# Patient Record
Sex: Female | Born: 1950 | Race: White | Hispanic: No | Smoking: Never smoker
Health system: Southern US, Community
[De-identification: ages and names within clinical notes are randomized; demographics above are authoritative.]

## PROBLEM LIST (undated history)

## (undated) DIAGNOSIS — R17 Unspecified jaundice: Secondary | ICD-10-CM

## (undated) DIAGNOSIS — I2510 Atherosclerotic heart disease of native coronary artery without angina pectoris: Secondary | ICD-10-CM

## (undated) DIAGNOSIS — I447 Left bundle-branch block, unspecified: Secondary | ICD-10-CM

## (undated) DIAGNOSIS — M1990 Unspecified osteoarthritis, unspecified site: Secondary | ICD-10-CM

## (undated) DIAGNOSIS — I809 Phlebitis and thrombophlebitis of unspecified site: Secondary | ICD-10-CM

## (undated) DIAGNOSIS — K219 Gastro-esophageal reflux disease without esophagitis: Secondary | ICD-10-CM

## (undated) DIAGNOSIS — R112 Nausea with vomiting, unspecified: Secondary | ICD-10-CM

## (undated) DIAGNOSIS — R0600 Dyspnea, unspecified: Secondary | ICD-10-CM

## (undated) DIAGNOSIS — R011 Cardiac murmur, unspecified: Secondary | ICD-10-CM

## (undated) DIAGNOSIS — M810 Age-related osteoporosis without current pathological fracture: Secondary | ICD-10-CM

## (undated) DIAGNOSIS — I82409 Acute embolism and thrombosis of unspecified deep veins of unspecified lower extremity: Secondary | ICD-10-CM

## (undated) DIAGNOSIS — I509 Heart failure, unspecified: Secondary | ICD-10-CM

## (undated) DIAGNOSIS — I10 Essential (primary) hypertension: Secondary | ICD-10-CM

## (undated) DIAGNOSIS — R87629 Unspecified abnormal cytological findings in specimens from vagina: Secondary | ICD-10-CM

## (undated) DIAGNOSIS — K759 Inflammatory liver disease, unspecified: Secondary | ICD-10-CM

## (undated) DIAGNOSIS — K829 Disease of gallbladder, unspecified: Secondary | ICD-10-CM

## (undated) DIAGNOSIS — Z9889 Other specified postprocedural states: Secondary | ICD-10-CM

## (undated) HISTORY — PX: ABLATION: SHX5711

## (undated) HISTORY — DX: Disease of gallbladder, unspecified: K82.9

## (undated) HISTORY — DX: Essential (primary) hypertension: I10

## (undated) HISTORY — PX: CHOLECYSTECTOMY: SHX55

## (undated) HISTORY — DX: Age-related osteoporosis without current pathological fracture: M81.0

## (undated) HISTORY — DX: Unspecified jaundice: R17

## (undated) HISTORY — DX: Phlebitis and thrombophlebitis of unspecified site: I80.9

## (undated) HISTORY — PX: REPLACEMENT TOTAL HIP W/  RESURFACING IMPLANTS: SUR1222

## (undated) HISTORY — PX: CATARACT EXTRACTION W/ INTRAOCULAR LENS IMPLANT: SHX1309

## (undated) HISTORY — DX: Unspecified osteoarthritis, unspecified site: M19.90

## (undated) HISTORY — DX: Unspecified abnormal cytological findings in specimens from vagina: R87.629

---

## 1982-12-21 HISTORY — PX: TUBAL LIGATION: SHX77

## 1998-04-05 ENCOUNTER — Ambulatory Visit (HOSPITAL_COMMUNITY): Admission: RE | Admit: 1998-04-05 | Discharge: 1998-04-05 | Payer: Self-pay | Admitting: Family Medicine

## 1999-10-14 ENCOUNTER — Other Ambulatory Visit: Admission: RE | Admit: 1999-10-14 | Discharge: 1999-10-14 | Payer: Self-pay | Admitting: Obstetrics and Gynecology

## 1999-11-20 ENCOUNTER — Encounter: Payer: Self-pay | Admitting: Family Medicine

## 1999-11-20 ENCOUNTER — Encounter: Admission: RE | Admit: 1999-11-20 | Discharge: 1999-11-20 | Payer: Self-pay | Admitting: Family Medicine

## 2000-04-05 ENCOUNTER — Other Ambulatory Visit: Admission: RE | Admit: 2000-04-05 | Discharge: 2000-04-05 | Payer: Self-pay | Admitting: Obstetrics and Gynecology

## 2000-10-28 ENCOUNTER — Other Ambulatory Visit: Admission: RE | Admit: 2000-10-28 | Discharge: 2000-10-28 | Payer: Self-pay | Admitting: Obstetrics and Gynecology

## 2001-03-12 ENCOUNTER — Emergency Department (HOSPITAL_COMMUNITY): Admission: EM | Admit: 2001-03-12 | Discharge: 2001-03-12 | Payer: Self-pay | Admitting: Emergency Medicine

## 2001-11-03 ENCOUNTER — Other Ambulatory Visit: Admission: RE | Admit: 2001-11-03 | Discharge: 2001-11-03 | Payer: Self-pay | Admitting: Obstetrics and Gynecology

## 2001-12-29 ENCOUNTER — Encounter: Admission: RE | Admit: 2001-12-29 | Discharge: 2001-12-29 | Payer: Self-pay | Admitting: Family Medicine

## 2001-12-29 ENCOUNTER — Encounter: Payer: Self-pay | Admitting: Family Medicine

## 2002-07-13 ENCOUNTER — Encounter: Admission: RE | Admit: 2002-07-13 | Discharge: 2002-07-13 | Payer: Self-pay | Admitting: Family Medicine

## 2002-07-13 ENCOUNTER — Encounter: Payer: Self-pay | Admitting: Family Medicine

## 2003-03-14 ENCOUNTER — Other Ambulatory Visit: Admission: RE | Admit: 2003-03-14 | Discharge: 2003-03-14 | Payer: Self-pay | Admitting: Obstetrics and Gynecology

## 2003-08-13 ENCOUNTER — Ambulatory Visit (HOSPITAL_COMMUNITY): Admission: RE | Admit: 2003-08-13 | Discharge: 2003-08-13 | Payer: Self-pay | Admitting: Gastroenterology

## 2004-03-24 ENCOUNTER — Other Ambulatory Visit: Admission: RE | Admit: 2004-03-24 | Discharge: 2004-03-24 | Payer: Self-pay | Admitting: Obstetrics and Gynecology

## 2007-12-04 ENCOUNTER — Emergency Department (HOSPITAL_COMMUNITY): Admission: EM | Admit: 2007-12-04 | Discharge: 2007-12-04 | Payer: Self-pay | Admitting: Emergency Medicine

## 2007-12-22 HISTORY — PX: TOTAL HIP ARTHROPLASTY: SHX124

## 2008-05-04 ENCOUNTER — Encounter: Admission: RE | Admit: 2008-05-04 | Discharge: 2008-05-04 | Payer: Self-pay | Admitting: Orthopedic Surgery

## 2008-09-10 ENCOUNTER — Inpatient Hospital Stay (HOSPITAL_COMMUNITY): Admission: RE | Admit: 2008-09-10 | Discharge: 2008-09-14 | Payer: Self-pay | Admitting: Orthopedic Surgery

## 2009-12-16 IMAGING — CR DG CHEST 2V
2 series · 2 of 2 positions shown · non-contrast
Comparison: None

CLINICAL DATA: Preop for left hip arthroplasty.  Nonsmoker.

CHEST - 2 VIEW

[w chest pa]
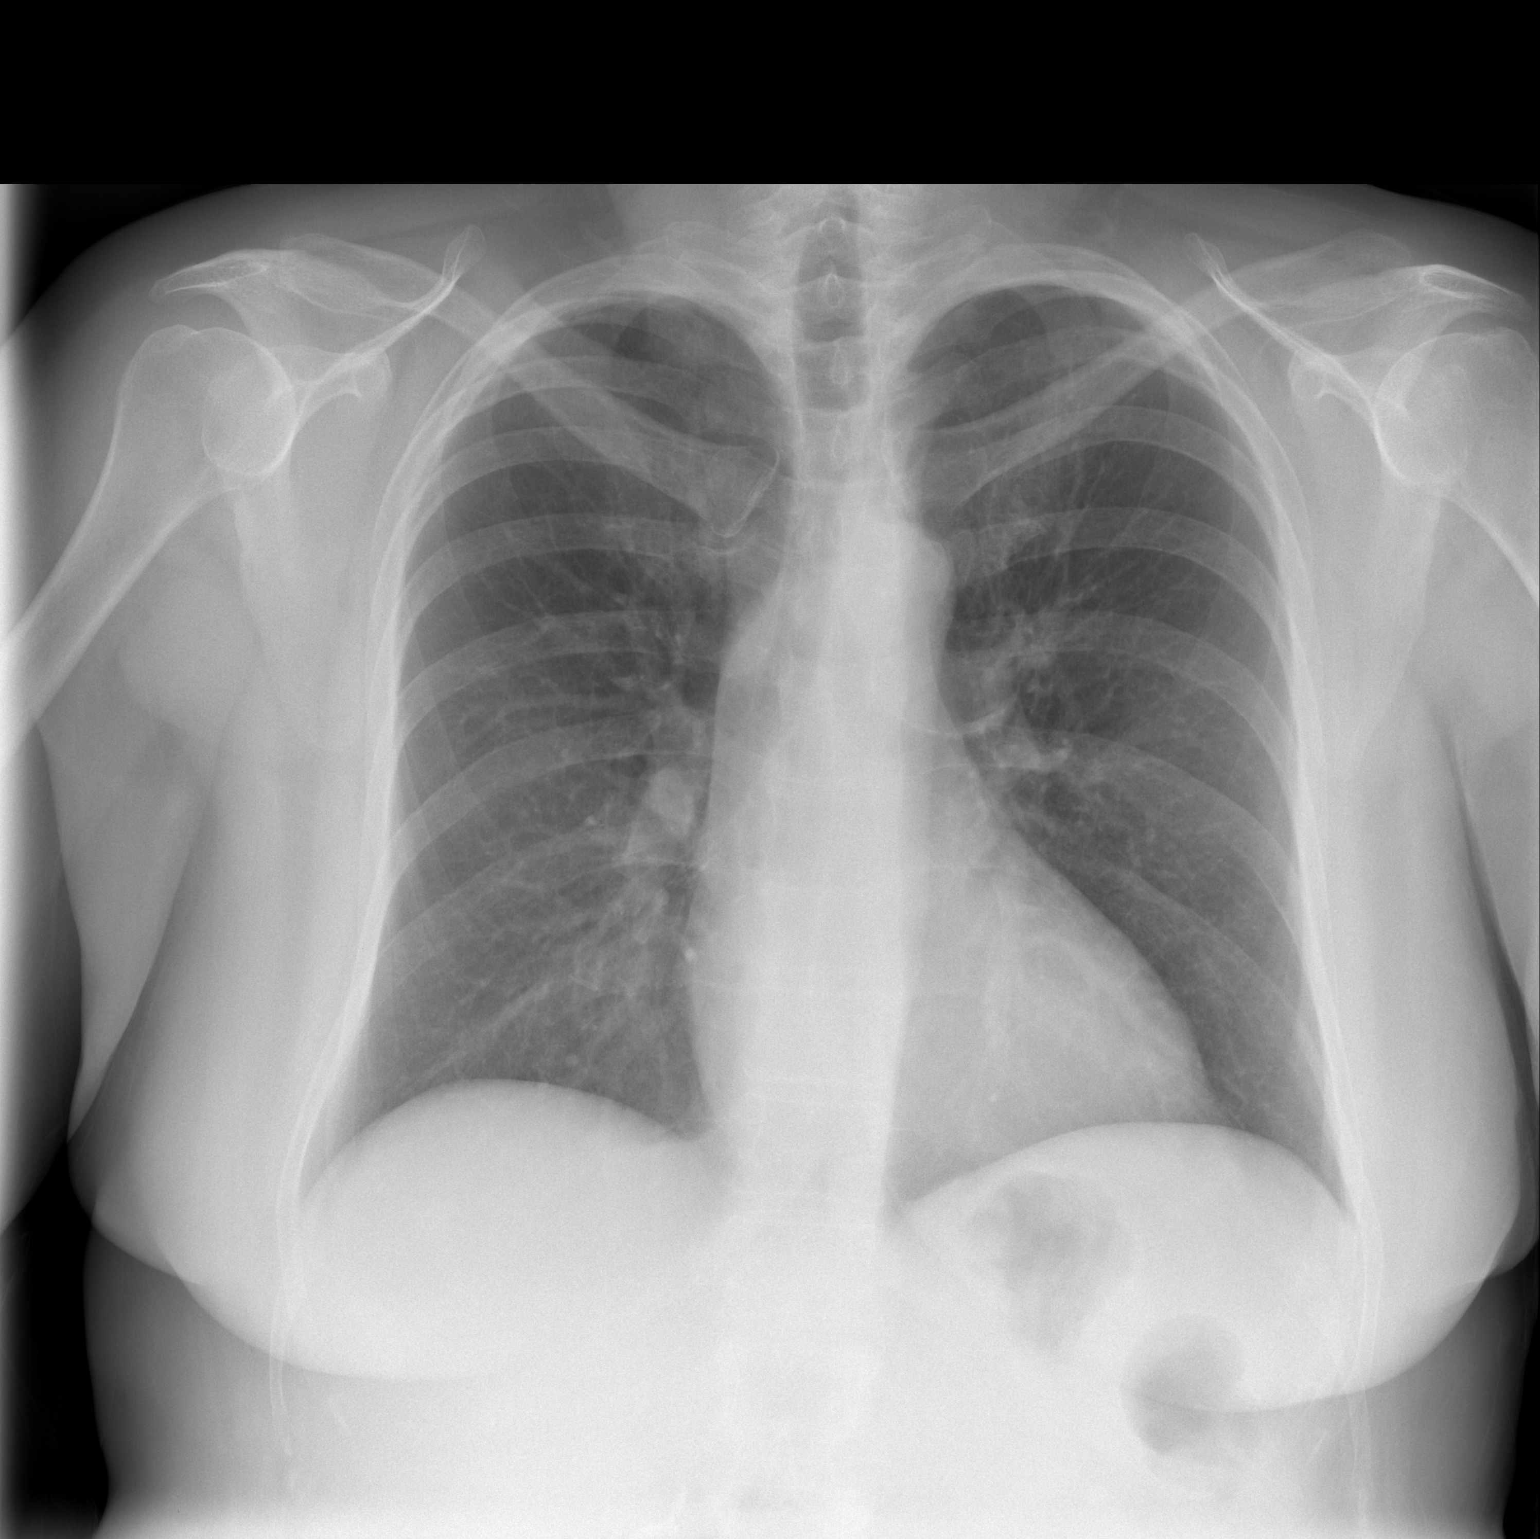

[w chest lat]
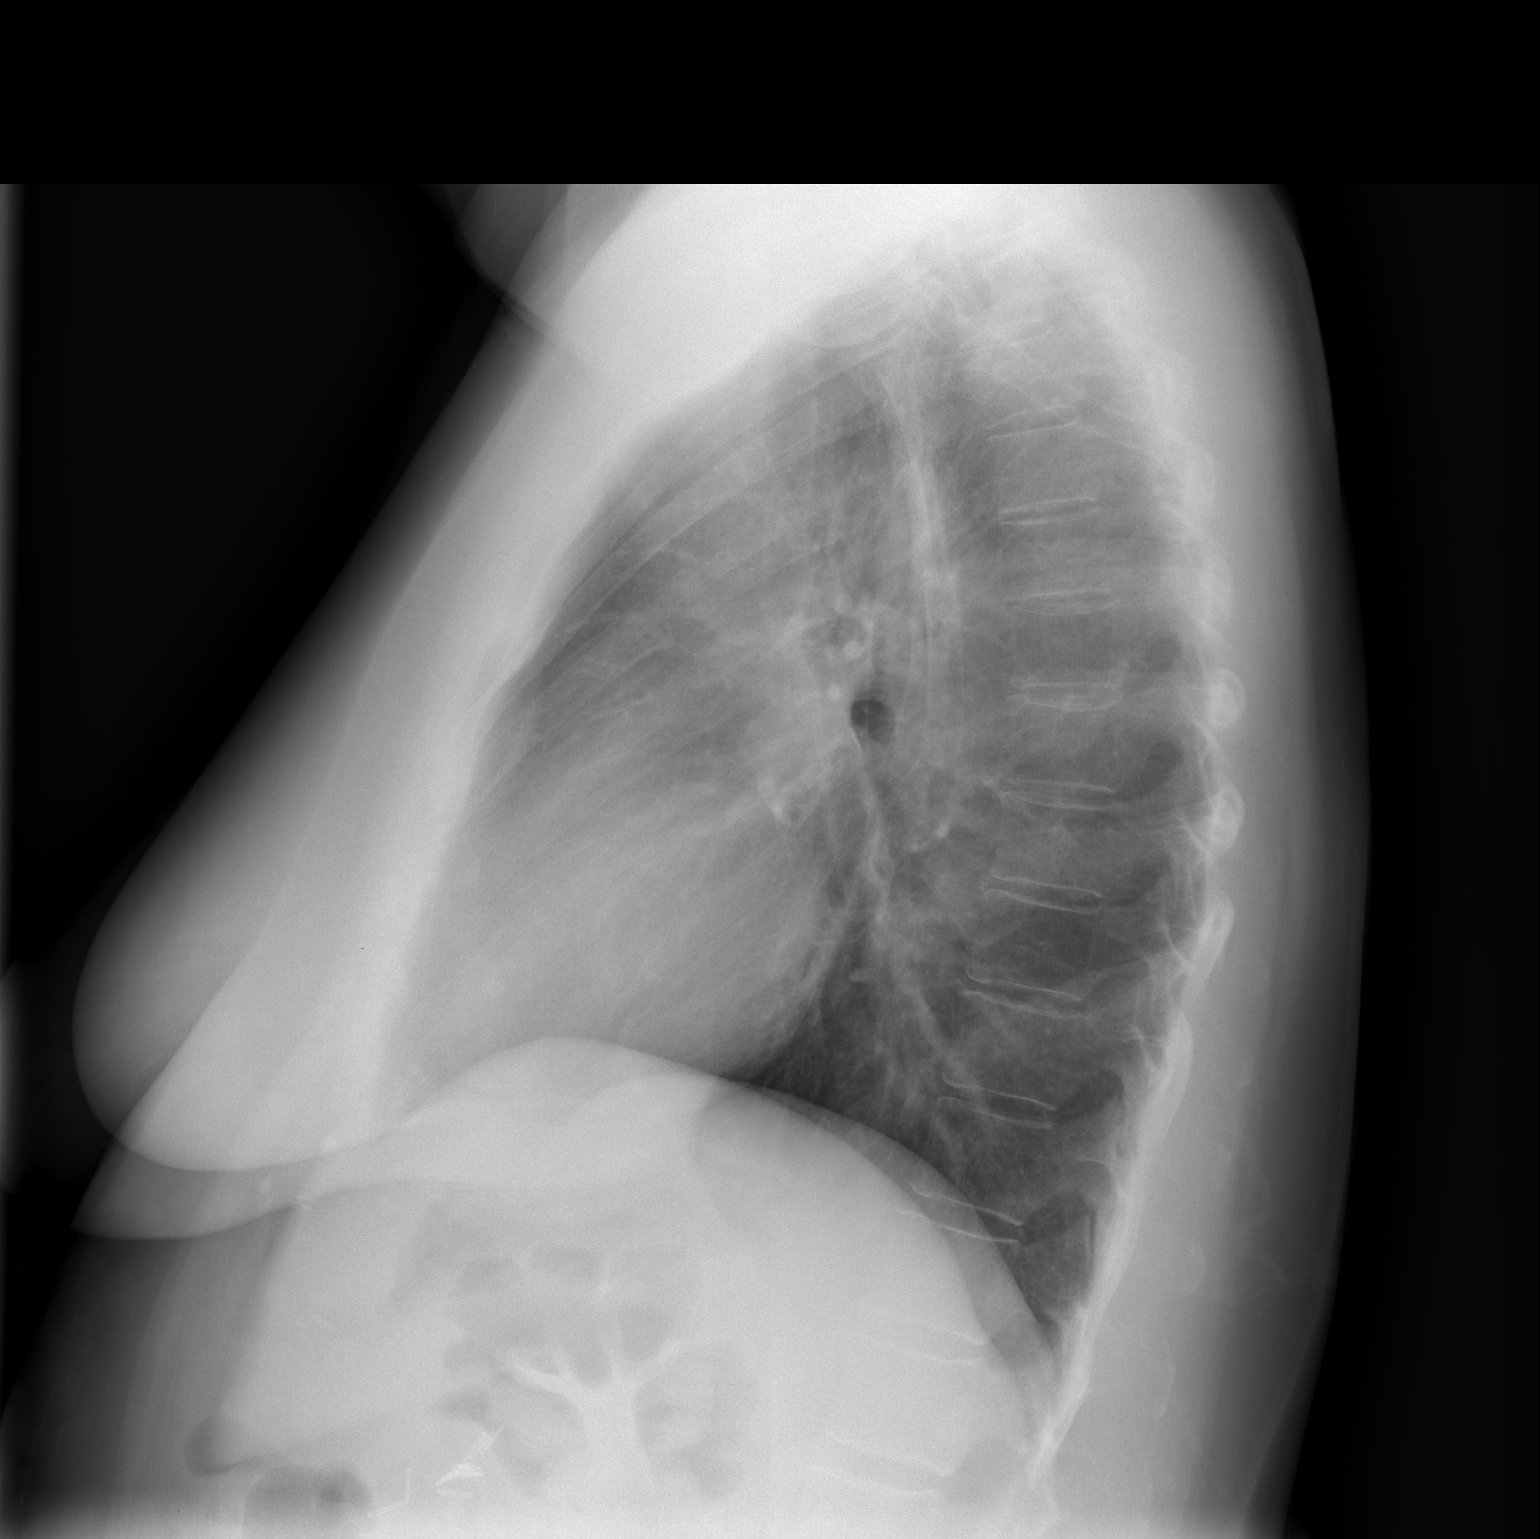

[2 of 2 positions shown; findings below may reference images not displayed]

FINDINGS: Midline trachea. Normal heart size and mediastinal
contours. No pleural effusion or pneumothorax. Clear lungs.
IMPRESSION: No acute cardiopulmonary disease.

## 2011-05-05 NOTE — Op Note (Signed)
NAMECHYNAH, ORIHUELA               ACCOUNT NO.:  [id number]   MEDICAL RECORD NO.:  [id number]          PATIENT TYPE:  INP   LOCATION:  0002                         FACILITY:  [facility]   PHYSICIAN:  Ollen Gross, M.D.    DATE OF BIRTH:  [birth year]   DATE OF PROCEDURE:  09/10/2008  DATE OF DISCHARGE:                               OPERATIVE REPORT   PREOPERATIVE DIAGNOSIS:  Osteoarthritis of left hip.   POSTOPERATIVE DIAGNOSIS:  Osteoarthritis of left hip.   PROCEDURE:  Left total hip arthroplasty.   SURGEON:  Ollen Gross, M.D.   ASSISTANT:  Avel Peace, PA-C.   ANESTHESIA:  General.   ESTIMATED BLOOD LOSS:  700.   DRAINS:  Hemovac times one.   COMPLICATIONS:  None.   CONDITION:  Stable to recovery.   BRIEF CLINICAL NOTE:  Ann Long is a 60 year old female, with end-stage  arthritis of the left hip, with progressively worsening pain and  dysfunction.  She has failed nonoperative management and presents for  total hip arthroplasty.   PROCEDURE IN DETAIL:  After the successful administration of general  anesthetic, the patient was placed in the right lateral decubitus  position with the left side up and held with the hip positioner.  The  left lower extremity was isolated from the perineum with plastic drapes  and prepped and draped in the usual sterile fashion.  Short  posterolateral incision was made with a 10 blade through subcutaneous  tissue through a very deep layer of subcutaneous tissue to the level of  fascia lata which was incised in line with the skin incision.  Sciatic  nerve was palpated and protected and the short external rotators  isolated off the femur.  Capsulectomy is performed and the hip was  dislocated, the center of femoral head is marked and the trial  prosthesis is placed such that the center of the trial head corresponds  to the center of native femoral head.  Osteotomy lines marked on the  femoral neck and osteotomy made with an oscillating saw.   The femoral  head was removed and the femur retracted anteriorly to gain acetabular  exposure.   Acetabular retractors were placed and labrum and osteophytes removed.  Acetabular reaming begins at 45 mm coursing in increments of 2 to 53 mm  and a 54 mL pinnacle acetabular shell was placed in anatomic position  and transfixed with two dome screws with excellent purchase.  Apex hole  eliminator is placed and the permanent 36 mm neutral Ultamet metal liner  is placed for a metal-on-metal hip replacement.   Femur is prepared with the canal finder and irrigation.  Axial reaming  is performed to 13.5 mm, proximal reaming to 18 F and sleeve machined to  small.  A 18 F small trial sleeve is placed with 18 x 13 stem and 36 +8  neck matching native anteversion.  36 +0 head is placed.  This reduced  too easily so I went to 36 +6 which had more appropriate soft tissue  tension.  Stability was excellent with full extension, full external  rotation, 70 degrees flexion,  40 degrees abduction and 90 degrees  internal rotation and 90 degrees of flexion and 70 degrees of internal  rotation.  By placing the left leg on top of the right I felt as though  the leg lengths were equal.  Hip was dislocated and trials removed.  Permanent 18 F small sleeve is placed with 18 x 13 stem, 36 +8 neck  matching native anteversion.  36 +6 head is placed and the hip was  reduced to the same stability parameters.  The wound was copiously  irrigated with saline solution and the short rotators reattached to the  femur through drill holes.  Fascia lata was closed over a Hemovac drain  with interrupted #1 Vicryl. The subcu closed in multiple layers with #1-  0 and #2-0 Vicryl and subcuticular running 4-0 Monocryl.  The drain was  hooked to suction.  Incision cleaned and dried.  Steri-Strips and bulky  sterile dressing applied.  She is then placed into a knee immobilizer,  awakened and transported to recovery in stable  condition.      Ollen Gross, M.D.  Electronically Signed     FA/MEDQ  D:  09/10/2008  T:  09/11/2008  Job:  [id number]

## 2011-05-05 NOTE — H&P (Signed)
Ann Long, Ann Long               ACCOUNT NO.:  [id number]   MEDICAL RECORD NO.:  [id number]          PATIENT TYPE:  INP   LOCATION:  1617                         FACILITY:  [facility]   PHYSICIAN:  Ollen Gross, M.D.    DATE OF BIRTH:  [birth year]   DATE OF ADMISSION:  09/10/2008  DATE OF DISCHARGE:                              HISTORY & PHYSICAL   CHIEF COMPLAINT:  Left hip pain.   HISTORY OF PRESENT ILLNESS:  The patient is a 60 year old female who has  been seen by Dr. Lequita Halt for ongoing left hip pain.  She has known  arthritis and was referred over to Dr. Lequita Halt.  She has been seen and  found to have arthritic changes which have been progressive in nature.  She is essentially now basically bone-on-bone with a little bit of  superior joint space left.  It is felt she has reached a point where she  could benefit from undergoing hip replacement.  Risks and benefits have  been discussed.  She elects to proceed with surgery.   ALLERGIES:  SULFA.   CURRENT MEDICATIONS:  Coumadin, lisinopril, Protonix, triamterene.   PAST MEDICAL HISTORY:  1. Hypertension.  2. Hiatal hernia.  3. Reflux disease.  4. Varicose veins.  5. Past history of hepatitis.  6. History of deep vein thromboses.  7. Osteoporosis.  8. History of cellulitis of the ear.  9. Postmenopausal.  10.Childhood illnesses of measles and mumps.   PAST SURGICAL HISTORY:  1. Tubal ligation.  2. Gallbladder surgery.  3. Uterus surgery.   FAMILY HISTORY:  Father deceased with a heart attack and stroke.  Mother  with hypertension, heart disease, atrial fibrillation, osteoporosis, and  congestive heart failure.  Family history also with diabetes, congestive  heart failure, heart attack, hypertension with siblings.   SOCIAL HISTORY:  Married, Animator.  Nonsmoker, no  alcohol.  Three children.  Husband and daughters will be assisting with  care after surgery.   REVIEW OF SYSTEMS:  GENERAL:  No fevers,  chills or night sweats.  NEUROLOGICAL:  No seizures, syncope or paralysis.  RESPIRATORY:  No  shortness of breath, productive cough or hemoptysis.  CARDIOVASCULAR:  No chest pain, no orthopnea.  GI:  No nausea, vomiting, diarrhea or  constipation.  GU:  A little bit of nocturia, mild incontinence.  No  dysuria, hematuria.  MUSCULOSKELETAL:  Left hip pain.   PHYSICAL EXAMINATION:  VITAL SIGNS:  Pulse 64, respirations 12, blood  pressure 126/88.  GENERAL:  A 60 year old white female well-nourished, well-developed in  no acute distress.  She is alert, oriented, and cooperative, pleasant,  tall frame, slightly overweight.  HEENT:  Normocephalic, atraumatic.  Pupils are round, reactive.  Noted  for glasses.  EOMs intact.  NECK:  Supple.  CHEST:  Clear anterior posterior chest with no rhonchi, rales or  wheezing.  HEART:  Regular rate and rhythm.  No murmur.  S1, S2 noted.  ABDOMEN:  Soft, nontender, bowel sounds present, round.  RECTAL/BREASTS/GENITALIA:  Not done and not pertinent to present  illness.  EXTREMITIES:  Left hip flexion 90, minimal  internal rotation, 10-15  degrees external rotation, 20 degrees abduction.   IMPRESSION:  Osteoarthritis left hip.   PLAN:  The patient will be admitted to [facility] to undergo a  left total hip replacement and arthroplasty.  Surgery will be performed  by Dr. Ollen Gross.      Alexzandrew L. Perkins, P.A.C.      Ollen Gross, M.D.  Electronically Signed    ALP/MEDQ  D:  09/09/2008  T:  09/11/2008  Job:  [id number]   cc:   L. Lupe Carney, M.D.  Fax: [phone]   Ollen Gross, M.D.  Fax: [phone]

## 2011-05-08 NOTE — Discharge Summary (Signed)
NAMEEIRENE, RATHER               ACCOUNT NO.:  [id number]   MEDICAL RECORD NO.:  [id number]          PATIENT TYPE:  INP   LOCATION:  1617                         FACILITY:  [facility]   PHYSICIAN:  Ollen Gross, M.D.    DATE OF BIRTH:  [birth year]   DATE OF ADMISSION:  09/10/2008  DATE OF DISCHARGE:  09/14/2008                               DISCHARGE SUMMARY   ADMITTING DIAGNOSES:  1. Osteoarthritis left hip.  2. Hypertension.  3. Hiatal hernia.  4. Reflux disease.  5. Varicose veins.  6. Past history of hepatitis.  7. History of deep vein thromboses.  8. Osteoporosis.  9. History of cellulitis of the ear.  10.Postmenopausal.  11.Childhood illnesses of measles and mumps   DISCHARGE DIAGNOSES:  1. Osteoarthritis left hip, status post left total hip replacement      arthroplasty.  2. Mild postop blood loss anemia, did not require transfusion.  3. Hypertension.  4. Hiatal hernia.  5. Reflux disease.  6. Varicose veins.  7. Past history of hepatitis.  8. History of deep vein thromboses.  9. Osteoporosis.  10.History of cellulitis of the ear.  11.Postmenopausal.  12.Childhood illnesses of measles and mumps   PROCEDURE:  September 10, 2008, left total hip.  Surgeon:  Dr. Lequita Halt,  assisted by Avel Peace, PA-C.  Anesthesia:  General.   CONSULTATIONS:  None.   BRIEF HISTORY:  Ann Long is a 60 year old female with end-stage arthritis  of the left hip, progressive worsening pain, dysfunction failed  nonoperative management and now presents for total hip arthroplasty.   LABORATORY DATA:  Preop CBC showed hemoglobin 13.9, hematocrit 41.2,  white cell count 5.0, platelets 259.  Postop hemoglobin 11.2, drifted  down to 10.6, came back up last night hemoglobin and hematocrit 10.7 and  31.5.  PT/PTT preop 14 and 35 respectively.  INR 1.1.  Serial protimes  followed per Coumadin protocol.  Last noted PT/INR 20.8, 1.7.  Chem  panel on admission:  All within normal limits.  Serial BMPs  were  followed.  Electrolytes remained normal.  Glucose did go up to 95-142,  back down to 121.  Preop UA did show moderate leukocyte esterase, rare  epithelials, only 3-6 white cells and only 0-2 red cells, rare bacteria.  Blood group type B+.   EKG, September 04, 2008:  Sinus rhythm with fusion complexes, left axis  deviation, confirmed by Dr. Carolanne Grumbling.  Left hip film, September 04, 2008:  Bilateral left greater right hip osteoarthritis without acute  findings.   HOSPITAL COURSE:  The patient admitted to [facility],  tolerated procedure well, later transferred to the recovery room then  orthopedic floor, started on PCA and p.o. analgesic pain control  following surgery.  She did have some nausea, vomiting on the evening of  the surgery, but it is much better by the morning of day one.  The pain  was also getting better.  Had decent output.  Hemovac drain placed at  surgery was pulled without difficulty.  Given 24 hours postop IV  antibiotics.  PCA was discontinued later that day.  She did have history of hypertension, but her lisinopril was being  held  due to the patient being normotensive postoperatively.  She did have  more moderate to severe pain on the evening of day one because she had  been up, so the PCA was restarted for muscle relaxant.  Discontinued the  PCA again and switched over IV push rescue med.  At dressing change,  incision looked good.  Started doing a little bit better with therapy.  Only got up and walked in the room on day two.  She was feeling a little  bit better by day three, encouraged p.m. meds.  Not quite independent  enough for therapy at home.  Kept for another day and received a little  bit more guidance.  By day four though on 09/14/2008, she is up walking  175 feet, pain was under better control.  Doing well, was discharged  home.   DISCHARGE PLAN:  1. Discharged home on September 14, 2008.  2. Discharge diagnoses please see  above.  3. Discharge meds:  Dilaudid, valium, Coumadin.  She did receive one      dose of Lovenox prior to discharge.  4. Follow-up 2 weeks.   ACTIVITY:  She is partial weightbearing 25%-50% left lower extremity.  Home health PT and nursing, total hip protocol, hip precautions.   DISPOSITION:  Home.   CONDITION ON DISCHARGE:  Improved.      Alexzandrew L. Perkins, P.A.C.      Ollen Gross, M.D.  Electronically Signed   ALP/MEDQ  D:  10/02/2008  T:  10/02/2008  Job:  [id number]   cc:   Ollen Gross, M.D.  Fax: [phone]   L. Lupe Carney, M.D.  Fax: [phone]

## 2011-05-08 NOTE — Op Note (Signed)
   NAME:  Ann Long, Ann Long                         ACCOUNT NO.:  [id number]   MEDICAL RECORD NO.:  [id number]                   PATIENT TYPE:  AMB   LOCATION:  ENDO                                 FACILITY:  [facility]   PHYSICIAN:  John C. Madilyn Fireman, M.D.                 DATE OF BIRTH:  1951-12-10   DATE OF PROCEDURE:  08/13/2003  DATE OF DISCHARGE:                                 OPERATIVE REPORT   PROCEDURE:  Colonoscopy.   INDICATIONS FOR PROCEDURE:  Colon cancer screening in a 60 year old patient  with no prior screening.   DESCRIPTION OF PROCEDURE:  The patient was placed in the left lateral  decubitus position then placed on the pulse monitor with continuous low flow  oxygen delivered by nasal cannula. She was sedated with 75 mcg IV fentanyl  and 7 mg IV Versed. The Olympus video colonoscope was inserted into the  rectum and advanced to the cecum, confirmed by transillumination at  McBurney's point and visualization of the ileocecal valve and appendiceal  orifice. The prep was excellent. The cecum, ascending and transverse colon  all appeared normal with no masses, polyps, diverticula or other mucosal  abnormalities. Within the descending and sigmoid colon, there were seen a  few scattered diverticula and no other abnormalities.  The rectum appeared  normal and retroflexed view of the anus revealed no obvious internal  hemorrhoids. The scope was then withdrawn and the patient returned to the  recovery room in stable condition. The patient tolerated the procedure well  and there were no immediate complications.   IMPRESSION:  Diverticulosis, otherwise, normal study.   PLAN:  Next colon screening by sigmoidoscopy in five years.                                               John C. Madilyn Fireman, M.D.    JCH/MEDQ  D:  08/13/2003  T:  08/13/2003  Job:  [id number]   cc:   Juluis Mire, M.D.  [address] [place]  [place] [address]  Fax: [phone]

## 2011-09-21 LAB — COMPREHENSIVE METABOLIC PANEL
ALT: 15
AST: 16
Alkaline Phosphatase: 56
CO2: 28
Calcium: 9.4
GFR calc Af Amer: >60
Glucose, Bld: 95
Potassium: 4.4
Sodium: 141
Total Protein: 6.7

## 2011-09-21 LAB — BASIC METABOLIC PANEL
BUN: 10
BUN: 11
CO2: 28
CO2: 29
Calcium: 8.4
Chloride: 108
Creatinine, Ser: 0.79
Glucose, Bld: 121 — ABNORMAL HIGH
Glucose, Bld: 142 — ABNORMAL HIGH
Potassium: 4
Sodium: 137

## 2011-09-21 LAB — TYPE AND SCREEN: ABO/RH(D): B POS

## 2011-09-21 LAB — PROTIME-INR
INR: 1.1
INR: 1.7 — ABNORMAL HIGH
Prothrombin Time: 14.8
Prothrombin Time: 15.4 — ABNORMAL HIGH
Prothrombin Time: 21.5 — ABNORMAL HIGH

## 2011-09-21 LAB — CBC
HCT: 31.5 — ABNORMAL LOW
HCT: 31.6 — ABNORMAL LOW
HCT: 33.6 — ABNORMAL LOW
Hemoglobin: 10.6 — ABNORMAL LOW
Hemoglobin: 13.9
MCHC: 33.4
MCHC: 33.5
MCV: 93.8
MCV: 94.3
Platelets: 227
Platelets: 227
Platelets: 246
RBC: 4.48
RDW: 12
RDW: 12.6
RDW: 12.8
WBC: 7.5

## 2011-09-21 LAB — URINALYSIS, ROUTINE W REFLEX MICROSCOPIC
Bilirubin Urine: NEGATIVE
Glucose, UA: NEGATIVE
Ketones, ur: NEGATIVE
Protein, ur: NEGATIVE
pH: 6

## 2011-09-21 LAB — ABO/RH: ABO/RH(D): B POS

## 2011-09-21 LAB — APTT: aPTT: 35

## 2011-09-21 LAB — URINE MICROSCOPIC-ADD ON

## 2011-09-28 LAB — URINE CULTURE: Colony Count: 50000

## 2011-09-28 LAB — POCT URINALYSIS DIP (DEVICE)
Ketones, ur: NEGATIVE
pH: 6.5

## 2012-12-27 ENCOUNTER — Encounter (HOSPITAL_BASED_OUTPATIENT_CLINIC_OR_DEPARTMENT_OTHER): Payer: 59 | Attending: General Surgery

## 2012-12-27 DIAGNOSIS — L97909 Non-pressure chronic ulcer of unspecified part of unspecified lower leg with unspecified severity: Secondary | ICD-10-CM | POA: Insufficient documentation

## 2012-12-27 DIAGNOSIS — E119 Type 2 diabetes mellitus without complications: Secondary | ICD-10-CM | POA: Insufficient documentation

## 2012-12-27 DIAGNOSIS — Z79899 Other long term (current) drug therapy: Secondary | ICD-10-CM | POA: Insufficient documentation

## 2012-12-28 NOTE — H&P (Signed)
Ann Long, Ann Long               ACCOUNT NO.:  [id number]  MEDICAL RECORD NO.:  [id number]  LOCATION:  FOOT                         FACILITY:  [facility]  PHYSICIAN:  Joanne Gavel, M.D.        DATE OF BIRTH:  06/27/1951  DATE OF ADMISSION:  12/27/2012 DATE OF DISCHARGE:                             HISTORY & PHYSICAL   CHIEF COMPLAINT:  Wound, left foreleg.  HISTORY OF PRESENT ILLNESS:  This is a 62 year old female with a history of bilateral DVTs remotely, who was on Coumadin at present.  She has had stasis dermatitis changes for many years.  She has a spontaneous wound on the left foreleg present for 3 months, has recently been treated with a course of Augmentin.  PAST MEDICAL HISTORY:  Significant for DVTs, GERD, hypertension, osteopenia, arthritis, and hepatitis A as a child.  SURGICAL HISTORY:  Laparoscopic cholecystectomy, left hip replacement 2009, and uterine ablation.  REVIEW OF SYSTEMS:  No history of diabetes, thyroid disease, epilepsy, convulsion, tremors.  She does have slight constipation.  MEDICATIONS:  NuLev, MiraLax, Tums, lisinopril, hydrochlorothiazide, pantoprazole, calcium, warfarin.  ALLERGIES:  DOXYCYCLINE causes nausea and diarrhea.  SULFA causes hives.  SOCIAL HISTORY:  Cigarettes none.  Alcohol none.  PHYSICAL EXAMINATION:  VITAL SIGNS:  Temperature 98.2, pulse 76, respirations 16, blood pressure 150/95. GENERAL:  A well developed, well nourished, in no distress. HEENT:  Cranium normocephalic.  Eyes, ears, nose, throat normal. NECK:  Supple. CHEST:  Clear. HEART:  Regular rhythm. ABDOMEN:  Examined __________ and is within normal limits. EXTREMITIES:  Peripheral pulses are palpable in the left lower extremity.  There is stasis dermatitis bilaterally.  ABI on the left side is 1.13.  There is a 1.2 x 1.7 x 0.1 wound in the midst of the stasis dermatitis in the left lower extremity.  IMPRESSION:  Chronic venous hypertension with ulcer.  PLAN OF  TREATMENT:  Santyl, hydrogel, and Unna boot.  We will see in 7 days.     Joanne Gavel, M.D.     RA/MEDQ  D:  12/27/2012  T:  12/28/2012  Job:  [id number]

## 2013-01-24 ENCOUNTER — Encounter (HOSPITAL_BASED_OUTPATIENT_CLINIC_OR_DEPARTMENT_OTHER): Payer: 59 | Attending: General Surgery

## 2013-01-24 DIAGNOSIS — L97909 Non-pressure chronic ulcer of unspecified part of unspecified lower leg with unspecified severity: Secondary | ICD-10-CM | POA: Insufficient documentation

## 2013-01-24 NOTE — Discharge Summary (Signed)
[name], Ann Long               ACCOUNT NO.:  [id number]  MEDICAL RECORD NO.:  [id number]  LOCATION:  FOOT                         FACILITY:  [facility]  PHYSICIAN:  Joanne Gavel, M.D.        DATE OF BIRTH:  17-Jul-1951  DATE OF ADMISSION:  12/27/2012 DATE OF DISCHARGE:  01/24/2013                              DISCHARGE SUMMARY   CLINIC COURSE:  The patient was admitted with chronic venous hypertension with ulcer, treated with Silvercel, Unna boots.  After 5 weeks, the patient has completely healed.  DISCHARGE CONDITION:  Good.  RECOMMENDATIONS:  Elevation, support stockings.  See [place] p.r.n.     Joanne Gavel, M.D.     RA/MEDQ  D:  01/24/2013  T:  01/24/2013  Job:  [id number]

## 2014-04-20 ENCOUNTER — Ambulatory Visit
Admission: RE | Admit: 2014-04-20 | Discharge: 2014-04-20 | Disposition: A | Discharge status: Home / Self Care | Payer: 59 | Source: Ambulatory Visit | Attending: Physician Assistant | Admitting: Physician Assistant

## 2014-04-20 ENCOUNTER — Other Ambulatory Visit: Payer: Self-pay | Admitting: Physician Assistant

## 2014-04-20 DIAGNOSIS — R52 Pain, unspecified: Secondary | ICD-10-CM

## 2016-01-06 ENCOUNTER — Other Ambulatory Visit (HOSPITAL_COMMUNITY): Payer: Self-pay | Admitting: Obstetrics and Gynecology

## 2016-01-06 DIAGNOSIS — R011 Cardiac murmur, unspecified: Secondary | ICD-10-CM

## 2016-01-07 ENCOUNTER — Other Ambulatory Visit (HOSPITAL_COMMUNITY): Payer: Self-pay

## 2016-01-09 ENCOUNTER — Other Ambulatory Visit: Payer: Self-pay

## 2016-01-09 ENCOUNTER — Ambulatory Visit (HOSPITAL_COMMUNITY): Payer: 59 | Attending: Cardiovascular Disease

## 2016-01-09 DIAGNOSIS — E669 Obesity, unspecified: Secondary | ICD-10-CM | POA: Insufficient documentation

## 2016-01-09 DIAGNOSIS — I10 Essential (primary) hypertension: Secondary | ICD-10-CM | POA: Diagnosis not present

## 2016-01-09 DIAGNOSIS — I071 Rheumatic tricuspid insufficiency: Secondary | ICD-10-CM | POA: Diagnosis not present

## 2016-01-09 DIAGNOSIS — Z8249 Family history of ischemic heart disease and other diseases of the circulatory system: Secondary | ICD-10-CM | POA: Insufficient documentation

## 2016-01-09 DIAGNOSIS — Z6837 Body mass index (BMI) 37.0-37.9, adult: Secondary | ICD-10-CM | POA: Insufficient documentation

## 2016-01-09 DIAGNOSIS — R011 Cardiac murmur, unspecified: Secondary | ICD-10-CM

## 2016-02-13 ENCOUNTER — Encounter: Payer: Self-pay | Admitting: Interventional Cardiology

## 2016-02-13 ENCOUNTER — Ambulatory Visit (INDEPENDENT_AMBULATORY_CARE_PROVIDER_SITE_OTHER): Payer: 59 | Admitting: Interventional Cardiology

## 2016-02-13 VITALS — BP 110/80 | HR 68 | Ht 67.0 in | Wt 235.0 lb

## 2016-02-13 DIAGNOSIS — I447 Left bundle-branch block, unspecified: Secondary | ICD-10-CM

## 2016-02-13 DIAGNOSIS — I519 Heart disease, unspecified: Secondary | ICD-10-CM | POA: Diagnosis not present

## 2016-02-13 NOTE — Patient Instructions (Signed)
Medication Instructions:  Same-no changes  Labwork: None  Testing/Procedures: None  Follow-Up: As needed     If you need a refill on your cardiac medications before your next appointment, please call your pharmacy.   

## 2016-02-13 NOTE — Progress Notes (Signed)
Patient ID: Ann Long, female   DOB: 1951/06/27, 65 y.o.   MRN: [id number]     Cardiology Office Note   Date:  02/13/2016   ID:  Ann Long, DOB 07/03/1951, MRN [id number]  PCP:  Lupe Carney, MD    No chief complaint on file. abnormal relaxation noted on echo   Wt Readings from Last 3 Encounters:  02/13/16 235 lb (106.595 kg)       History of Present Illness: Ann Long is a 65 y.o. female  Who has had a LBBB.  She has had prior hip replaement.  SHe took prednisone.  She saw her OB/GYN who heard a heart murmur.  An echo showed impaired relaxation.  Normal LV function with EF 50-55%.  No valvular abnormalities were noted on echocardiogram. The patient has been doing water aerobics of late. She is only limited by joint pain. No chest discomfort or shortness of breath. Overall, she feels well from a cardiac standpoint. She does have a family history of heart disease which is part of the reason why she was sent for evaluation.    Past Medical History  Diagnosis Date  . Abnormal vaginal Pap smear   . Jaundice   . Gallbladder disease   . Osteoporosis   . Arthritis   . Phlebitis   . Hypertension     History reviewed. No pertinent past surgical history.   Current Outpatient Prescriptions  Medication Sig Dispense Refill  . amoxicillin (AMOXIL) 500 MG capsule Take 500 mg by mouth 2 (two) times daily. FOR DENTIST    . cholecalciferol (VITAMIN D) 1000 units tablet Take 1,000 Units by mouth daily.    . hydrochlorothiazide (HYDRODIURIL) 25 MG tablet Take 25 mg by mouth daily.    Marland Kitchen lisinopril (PRINIVIL,ZESTRIL) 5 MG tablet Take 5 mg by mouth daily.    . pantoprazole (PROTONIX) 40 MG tablet Take 40 mg by mouth daily.    . Probiotic Product (PROBIOTIC DAILY PO) Take 1 tablet by mouth daily.    Marland Kitchen warfarin (COUMADIN) 5 MG tablet Take 5 mg by mouth daily. MONDAYS / THURSDAYS TAKE 7.5 MG     No current facility-administered medications for this visit.    Allergies:    Sulfa antibiotics    Social History:  The patient  reports that she has never smoked. She has never used smokeless tobacco. She reports that she does not drink alcohol or use illicit drugs.   Family History:  The patient's family history includes Atrial fibrillation in her sister; Heart attack in her father, paternal grandfather, and sister; Hypertension in her mother and sister; Stroke in her father, mother, and sister.    ROS:  Please see the history of present illness.   Otherwise, review of systems are positive for hip pain.   All other systems are reviewed and negative.    PHYSICAL EXAM: VS:  BP 110/80 mmHg  Pulse 68  Ht  (1.702 m)  Wt 235 lb (106.595 kg)  BMI 36.80 kg/m2 , BMI Body mass index is 36.8 kg/(m^2). GEN: Well nourished, well developed, in no acute distress HEENT: normal Neck: no JVD, carotid bruits, or masses Cardiac: RRR; soft early systolic murmur when she is supine, no rubs, or gallops,no edema  Respiratory:  clear to auscultation bilaterally, normal work of breathing GI: soft, nontender, nondistended, + BS MS: no deformity or atrophy Skin: warm and dry, no rash Neuro:  Strength and sensation are intact Psych: euthymic mood, full affect  EKG:   The ekg ordered today demonstrates NSR, LBBB   Recent Labs: No results found for requested labs within last 365 days.   Lipid Panel No results found for: CHOL, TRIG, HDL, CHOLHDL, VLDL, LDLCALC, LDLDIRECT   Other studies Reviewed: Additional studies/ records that were reviewed today with results demonstrating: Echocardiogram reviewed, results noted as above.   ASSESSMENT AND PLAN:  1. Heart murmur: Soft early systolic murmur. Sounds benign. No valvular pathology noted on echocardiogram.  2. Left bundle branch block: This is an old finding for this patient. No additional workup needed at this time. 3. Hypertension: Blood pressure well controlled. Continue current medicines. 4. History of DVT: Continue  Coumadin. 5. Abnormal relaxation noted on echo. This is an age-appropriate finding for her.   Current medicines are reviewed at length with the patient today.  The patient concerns regarding her medicines were addressed.  The following changes have been made:  No change  Labs/ tests ordered today include:  No orders of the defined types were placed in this encounter.    Recommend 150 minutes/week of aerobic exercise Low fat, low carb, high fiber diet recommended  Disposition:   FU in prn   Delorise Jackson., MD  02/13/2016 11:46 AM    [facility] [address] [place], [place], [place]  [address] Phone: [phone]; Fax: [phone]

## 2019-09-12 NOTE — Patient Instructions (Addendum)
DUE TO COVID-19 ONLY ONE VISITOR IS ALLOWED TO COME WITH YOU AND STAY IN THE WAITING ROOM ONLY DURING PRE OP AND PROCEDURE DAY OF SURGERY. THE 1 VISITOR MAY VISIT WITH YOU AFTER SURGERY IN YOUR PRIVATE ROOM DURING VISITING HOURS ONLY!  YOU NEED TO HAVE A COVID 19 TEST ON____SATURDAY 9-26-2020___ @__9 :00AM_____, THIS TEST MUST BE DONE BEFORE SURGERY, COME  [address], [place] , [address].  ([place]) ONCE YOUR COVID TEST IS COMPLETED, PLEASE BEGIN THE QUARANTINE INSTRUCTIONS AS OUTLINED IN YOUR HANDOUT.                [place]    Your procedure is scheduled on: 09-20-2019   Report to [place]  Entrance   Report to admitting at 8:30AM     Call this number if you have problems the morning of surgery [place], NO CHEWING GUM May.   Do not eat food After Midnight. YOU MAY HAVE CLEAR LIQUIDS FROM MIDNIGHT UNTIL 8:00AM. At 8:00AM Please finish the prescribed Pre-Surgery ENSURE drink. Nothing by mouth after you finish the ENSURE drink !   CLEAR LIQUID DIET   Foods Allowed                                                                     Foods Excluded  Coffee and tea, regular and decaf                             liquids that you cannot  Plain Jell-O any favor except red or purple                                           see through such as: Fruit ices (not with fruit pulp)                                     milk, soups, orange juice  Iced Popsicles                                    All solid food Carbonated beverages, regular and diet                                    Cranberry, grape and apple juices Sports drinks like Gatorade Lightly seasoned clear broth or consume(fat free) Sugar, honey syrup  Sample Menu Breakfast                                Lunch                                     Supper  Cranberry juice                    Beef broth                             Chicken broth Jell-O                                     Grape juice                           Apple juice Coffee or tea                        Jell-O                                      Popsicle                                                Coffee or tea                        Coffee or tea  _____________________________________________________________________     Take these medicines the morning of surgery with A SIP OF WATER: ZYRTEC                                 You may not have any metal on your body including hair pins and              piercings  Do not wear jewelry, make-up, lotions, powders or perfumes, deodorant             Do not wear nail polish.  Do not shave  48 hours prior to surgery.     Do not bring valuables to the hospital. [facility] IS NOT             RESPONSIBLE   FOR VALUABLES.  Contacts, dentures or bridgework may not be worn into surgery.  YOU MAY BRING A SMALL OVERNIGHT BAG              Please read over the following fact sheets you were given: _____________________________________________________________________             [place] - Preparing for Surgery Before surgery, you can play an important role.  Because skin is not sterile, your skin needs to be as free of germs as possible.  You can reduce the number of germs on your skin by washing with CHG (chlorahexidine gluconate) soap before surgery.  CHG is an antiseptic cleaner which kills germs and bonds with the skin to continue killing germs even after washing. Please DO NOT use if you have an allergy to CHG or antibacterial soaps.  If your skin becomes reddened/irritated stop using the CHG and inform your nurse when you arrive at Short Stay. Do not shave (including legs and underarms) for at least 48 hours prior to the first CHG shower.  You may shave your face/neck. Please follow these instructions carefully:  1.  Shower with CHG Soap the night before surgery and the  morning of  Surgery.  2.  If you choose to wash your hair, wash your hair first as usual with your  normal  shampoo.  3.  After you shampoo, rinse your hair and body thoroughly to remove the  shampoo.                           4.  Use CHG as you would any other liquid soap.  You can apply chg directly  to the skin and wash                       Gently with a scrungie or clean washcloth.  5.  Apply the CHG Soap to your body ONLY FROM THE NECK DOWN.   Do not use on face/ open                           Wound or open sores. Avoid contact with eyes, ears mouth and genitals (private parts).                       Wash face,  Genitals (private parts) with your normal soap.             6.  Wash thoroughly, paying special attention to the area where your surgery  will be performed.  7.  Thoroughly rinse your body with warm water from the neck down.  8.  DO NOT shower/wash with your normal soap after using and rinsing off  the CHG Soap.                9.  Pat yourself dry with a clean towel.            10.  Wear clean pajamas.            11.  Place clean sheets on your bed the night of your first shower and do not  sleep with pets. Day of Surgery : Do not apply any lotions/deodorants the morning of surgery.  Please wear clean clothes to the hospital/surgery center.  FAILURE TO FOLLOW THESE INSTRUCTIONS MAY RESULT IN THE CANCELLATION OF YOUR SURGERY PATIENT SIGNATURE_________________________________  NURSE SIGNATURE__________________________________  ________________________________________________________________________   [place]  An incentive spirometer is a tool that can help keep your lungs clear and active. This tool measures how well you are filling your lungs with each breath. Taking long deep breaths may help reverse or decrease the chance of developing breathing (pulmonary) problems (especially infection) following:  A long period of time when you are unable to move or be active. BEFORE  THE PROCEDURE   If the spirometer includes an indicator to show your best effort, your nurse or respiratory therapist will set it to a desired goal.  If possible, sit up straight or lean slightly forward. Try not to slouch.  Hold the incentive spirometer in an upright position. INSTRUCTIONS FOR USE  1. Sit on the edge of your bed if possible, or sit up as far as you can in bed or on a chair. 2. Hold the incentive spirometer in an upright position. 3. Breathe out normally. 4. Place the mouthpiece in your mouth and seal your lips tightly around it. 5. Breathe in slowly and as deeply as possible, raising the piston or the ball toward the top of the  column. 6. Hold your breath for 3-5 seconds or for as long as possible. Allow the piston or ball to fall to the bottom of the column. 7. Remove the mouthpiece from your mouth and breathe out normally. 8. Rest for a few seconds and repeat Steps 1 through 7 at least 10 times every 1-2 hours when you are awake. Take your time and take a few normal breaths between deep breaths. 9. The spirometer may include an indicator to show your best effort. Use the indicator as a goal to work toward during each repetition. 10. After each set of 10 deep breaths, practice coughing to be sure your lungs are clear. If you have an incision (the cut made at the time of surgery), support your incision when coughing by placing a pillow or rolled up towels firmly against it. Once you are able to get out of bed, walk around indoors and cough well. You may stop using the incentive spirometer when instructed by your caregiver.  RISKS AND COMPLICATIONS  Take your time so you do not get dizzy or light-headed.  If you are in pain, you may need to take or ask for pain medication before doing incentive spirometry. It is harder to take a deep breath if you are having pain. AFTER USE  Rest and breathe slowly and easily.  It can be helpful to keep track of a log of your progress.  Your caregiver can provide you with a simple table to help with this. If you are using the spirometer at home, follow these instructions: SEEK MEDICAL CARE IF:   You are having difficultly using the spirometer.  You have trouble using the spirometer as often as instructed.  Your pain medication is not giving enough relief while using the spirometer.  You develop fever of 100.5 F (38.1 C) or higher. SEEK IMMEDIATE MEDICAL CARE IF:   You cough up bloody sputum that had not been present before.  You develop fever of 102 F (38.9 C) or greater.  You develop worsening pain at or near the incision site. MAKE SURE YOU:   Understand these instructions.  Will watch your condition.  Will get help right away if you are not doing well or get worse. Document Released: 04/19/2007 Document Revised: 02/29/2012 Document Reviewed: 06/20/2007 Exit[employer] Patient Information 2014 Exit[employer], [place].   ________________________________________________________________________  WHAT IS A BLOOD TRANSFUSION? Blood Transfusion Information  A transfusion is the replacement of blood or some of its parts. Blood is made up of multiple cells which provide different functions.  Red blood cells carry oxygen and are used for blood loss replacement.  White blood cells fight against infection.  Platelets control bleeding.  Plasma helps clot blood.  Other blood products are available for specialized needs, such as hemophilia or other clotting disorders. BEFORE THE TRANSFUSION  Who gives blood for transfusions?   Healthy volunteers who are fully evaluated to make sure their blood is safe. This is blood bank blood. Transfusion therapy is the safest it has ever been in the practice of medicine. Before blood is taken from a donor, a complete history is taken to make sure that person has no history of diseases nor engages in risky social behavior (examples are intravenous drug use or sexual activity with multiple  partners). The donor's travel history is screened to minimize risk of transmitting infections, such as malaria. The donated blood is tested for signs of infectious diseases, such as HIV and hepatitis. The blood is then tested to be sure  it is compatible with you in order to minimize the chance of a transfusion reaction. If you or a relative donates blood, this is often done in anticipation of surgery and is not appropriate for emergency situations. It takes many days to process the donated blood. RISKS AND COMPLICATIONS Although transfusion therapy is very safe and saves many lives, the main dangers of transfusion include:   Getting an infectious disease.  Developing a transfusion reaction. This is an allergic reaction to something in the blood you were given. Every precaution is taken to prevent this. The decision to have a blood transfusion has been considered carefully by your caregiver before blood is given. Blood is not given unless the benefits outweigh the risks. AFTER THE TRANSFUSION  Right after receiving a blood transfusion, you will usually feel much better and more energetic. This is especially true if your red blood cells have gotten low (anemic). The transfusion raises the level of the red blood cells which carry oxygen, and this usually causes an energy increase.  The nurse administering the transfusion will monitor you carefully for complications. HOME CARE INSTRUCTIONS  No special instructions are needed after a transfusion. You may find your energy is better. Speak with your caregiver about any limitations on activity for underlying diseases you may have. SEEK MEDICAL CARE IF:   Your condition is not improving after your transfusion.  You develop redness or irritation at the intravenous (IV) site. SEEK IMMEDIATE MEDICAL CARE IF:  Any of the following symptoms occur over the next 12 hours:  Shaking chills.  You have a temperature by mouth above 102 F (38.9 C), not  controlled by medicine.  Chest, back, or muscle pain.  People around you feel you are not acting correctly or are confused.  Shortness of breath or difficulty breathing.  Dizziness and fainting.  You get a rash or develop hives.  You have a decrease in urine output.  Your urine turns a dark color or changes to pink, red, or brown. Any of the following symptoms occur over the next 10 days:  You have a temperature by mouth above 102 F (38.9 C), not controlled by medicine.  Shortness of breath.  Weakness after normal activity.  The white part of the eye turns yellow (jaundice).  You have a decrease in the amount of urine or are urinating less often.  Your urine turns a dark color or changes to pink, red, or brown. Document Released: 12/04/2000 Document Revised: 02/29/2012 Document Reviewed: 07/23/2008 [facility] Patient Information 2014 [place], [place].  _______________________________________________________________________

## 2019-09-14 ENCOUNTER — Other Ambulatory Visit: Payer: Self-pay

## 2019-09-14 ENCOUNTER — Encounter (HOSPITAL_COMMUNITY): Payer: Self-pay

## 2019-09-14 ENCOUNTER — Encounter (HOSPITAL_COMMUNITY)
Admission: RE | Admit: 2019-09-14 | Discharge: 2019-09-14 | Disposition: A | Discharge status: Home / Self Care | Payer: Medicare Other | Source: Ambulatory Visit | Attending: Orthopedic Surgery | Admitting: Orthopedic Surgery

## 2019-09-14 DIAGNOSIS — Z01812 Encounter for preprocedural laboratory examination: Secondary | ICD-10-CM | POA: Diagnosis not present

## 2019-09-14 DIAGNOSIS — Z01810 Encounter for preprocedural cardiovascular examination: Secondary | ICD-10-CM | POA: Diagnosis not present

## 2019-09-14 DIAGNOSIS — M1611 Unilateral primary osteoarthritis, right hip: Secondary | ICD-10-CM | POA: Insufficient documentation

## 2019-09-14 DIAGNOSIS — Z0184 Encounter for antibody response examination: Secondary | ICD-10-CM | POA: Diagnosis not present

## 2019-09-14 HISTORY — DX: Other specified postprocedural states: Z98.890

## 2019-09-14 HISTORY — DX: Cardiac murmur, unspecified: R01.1

## 2019-09-14 HISTORY — DX: Acute embolism and thrombosis of unspecified deep veins of unspecified lower extremity: I82.409

## 2019-09-14 HISTORY — DX: Other specified postprocedural states: R11.2

## 2019-09-14 HISTORY — DX: Nausea with vomiting, unspecified: R11.2

## 2019-09-14 HISTORY — DX: Left bundle-branch block, unspecified: I44.7

## 2019-09-14 HISTORY — DX: Unspecified osteoarthritis, unspecified site: M19.90

## 2019-09-14 LAB — COMPREHENSIVE METABOLIC PANEL
ALT: 16 U/L (ref 0–44)
AST: 18 U/L (ref 15–41)
Albumin: 4.1 g/dL (ref 3.5–5.0)
Alkaline Phosphatase: 57 U/L (ref 38–126)
Anion gap: 7 (ref 5–15)
BUN: 22 mg/dL (ref 8–23)
CO2: 26 mmol/L (ref 22–32)
Calcium: 9 mg/dL (ref 8.9–10.3)
Chloride: 103 mmol/L (ref 98–111)
Creatinine, Ser: 0.84 mg/dL (ref 0.44–1.00)
GFR calc Af Amer: >60 mL/min (ref >60)
GFR calc non Af Amer: >60 mL/min (ref >60)
Glucose, Bld: 94 mg/dL (ref 70–99)
Potassium: 4.2 mmol/L (ref 3.5–5.1)
Sodium: 136 mmol/L (ref 135–145)
Total Bilirubin: 0.8 mg/dL (ref 0.3–1.2)
Total Protein: 7 g/dL (ref 6.5–8.1)

## 2019-09-14 LAB — CBC WITH DIFFERENTIAL/PLATELET
Abs Immature Granulocytes: 0.01 10*3/uL (ref 0.00–0.07)
Basophils Absolute: 0 10*3/uL (ref 0.0–0.1)
Basophils Relative: 1 %
Eosinophils Absolute: 0.1 10*3/uL (ref 0.0–0.5)
Eosinophils Relative: 1 %
HCT: 41.1 % (ref 36.0–46.0)
Hemoglobin: 13.2 g/dL (ref 12.0–15.0)
Immature Granulocytes: 0 %
Lymphocytes Relative: 41 %
Lymphs Abs: 2 10*3/uL (ref 0.7–4.0)
MCH: 30.8 pg (ref 26.0–34.0)
MCHC: 32.1 g/dL (ref 30.0–36.0)
MCV: 95.8 fL (ref 80.0–100.0)
Monocytes Absolute: 0.3 10*3/uL (ref 0.1–1.0)
Monocytes Relative: 6 %
Neutro Abs: 2.5 10*3/uL (ref 1.7–7.7)
Neutrophils Relative %: 51 %
Platelets: 294 10*3/uL (ref 150–400)
RBC: 4.29 MIL/uL (ref 3.87–5.11)
RDW: 13 % (ref 11.5–15.5)
WBC: 4.9 10*3/uL (ref 4.0–10.5)
nRBC: 0 % (ref 0.0–0.2)

## 2019-09-14 LAB — SURGICAL PCR SCREEN
MRSA, PCR: NEGATIVE
Staphylococcus aureus: NEGATIVE

## 2019-09-14 LAB — PROTIME-INR
INR: 2 — ABNORMAL HIGH (ref 0.8–1.2)
Prothrombin Time: 22.8 seconds — ABNORMAL HIGH (ref 11.4–15.2)

## 2019-09-14 LAB — APTT: aPTT: 56 seconds — ABNORMAL HIGH (ref 24–36)

## 2019-09-14 NOTE — Progress Notes (Addendum)
PCP - dean mitchell md Cardiologist - none currently   Chest x-ray -  EKG - done  today  Stress Test -  ECHO - 2017 epic  Cardiac Cath -   Sleep Study -  CPAP -   Fasting Blood Sugar -  Checks Blood Sugar _____ times a day  Blood Thinner Instructions: warfarin- per patient . instructed by her PCP Dr Alroy Dust : last dose on 9-22, start lovenox on 9-23  Aspirin Instructions:n/a Last Dose:  Anesthesia review:   Hx of left BBB, dx by pcp, referred to cardiology Dr Irish Lack in 2017 after heart murmur noticed on assessment at obgyn office. Patient report she underwent testing with ECHO and results were not "definitive". Today denies hx of chest pain not sob , palpitations etc. EKG per MD order obtained today .   Final ekg today again shows left BBB  Patient denies shortness of breath, fever, cough and chest pain at PAT appointment   Patient verbalized understanding of instructions that were given to them at the PAT appointment. Patient was also instructed that they will need to review over the PAT instructions again at home before surgery.

## 2019-09-16 ENCOUNTER — Other Ambulatory Visit (HOSPITAL_COMMUNITY)
Admission: RE | Admit: 2019-09-16 | Discharge: 2019-09-16 | Disposition: A | Discharge status: Home / Self Care | Payer: Medicare Other | Source: Ambulatory Visit | Attending: Orthopedic Surgery | Admitting: Orthopedic Surgery

## 2019-09-16 DIAGNOSIS — Z01812 Encounter for preprocedural laboratory examination: Secondary | ICD-10-CM | POA: Insufficient documentation

## 2019-09-16 DIAGNOSIS — Z20828 Contact with and (suspected) exposure to other viral communicable diseases: Secondary | ICD-10-CM | POA: Diagnosis not present

## 2019-09-17 LAB — NOVEL CORONAVIRUS, NAA (HOSP ORDER, SEND-OUT TO REF LAB; TAT 18-24 HRS): SARS-CoV-2, NAA: NOT DETECTED

## 2019-09-18 NOTE — H&P (Signed)
TOTAL HIP ADMISSION H&P  Patient is admitted for right total hip arthroplasty.  Subjective:  Chief Complaint: right hip pain  HPI: [place], 68 y.o. female, has a history of pain and functional disability in the right hip(s) due to arthritis and patient has failed non-surgical conservative treatments for greater than 12 weeks to include corticosteriod injections and activity modification.  Onset of symptoms was gradual starting 5 years ago with gradually worsening course since that time.The patient noted no past surgery on the right hip(s).  Patient currently rates pain in the right hip at 5 out of 10 with activity. Patient has worsening of pain with activity and weight bearing and pain that interfers with activities of daily living. Patient has evidence of severe end-stage arthritis, bone-on-bone with large osteophyte formation by imaging studies. This condition presents safety issues increasing the risk of falls.  There is no current active infection.  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, then one in the left leg, then one in the left kidney --all were onsearate occasions    Gallbladder disease    Heart murmur    Hypertension    Jaundice    as a child, her sister was diagnosed with it due to ingestion hepatitis, she later develped januce and thpugh was not testes herself , was presumed to also have the same time of hepatitis and was subsequesntly treated with globulin '   Left bundle branch block    "my pcp told me years ago i had it , 2 -3 years ago my obgyn heard a heart murmur and sent me to [cardiology] where they did a ultrasound of my heart , they didnt find anything definitive" ,  denies chest pain , sob, syncope    Osteoarthritis    Osteoporosis    Phlebitis    PONV (postoperative nausea and vomiting)     Past Surgical History:  Procedure Laterality Date   ABLATION  uterine   CHOLECYSTECTOMY  over 10 years ago   TOTAL HIP ARTHROPLASTY Left 2009   at [facility]    TUBAL LIGATION      No current facility-administered medications for this encounter.    Current Outpatient Medications  Medication Sig Dispense Refill Last Dose   azelastine (ASTELIN) 0.1 % nasal spray Place 1 spray into both nostrils 2 (two) times daily as needed for rhinitis or allergies. Use in each nostril as directed      cetirizine (ZYRTEC) 10 MG tablet Take 10 mg by mouth daily.      cholecalciferol (VITAMIN D) 1000 units tablet Take 1,000 Units by mouth daily.      docusate sodium (COLACE) 100 MG capsule Take 100 mg by mouth daily.      fluticasone (FLONASE) 50 MCG/ACT nasal spray Place 1 spray into both nostrils daily as needed for allergies or rhinitis.      guaiFENesin (MUCINEX) 600 MG 12 hr tablet Take 600 mg by mouth 2 (two) times daily as needed for cough.      hydrochlorothiazide (HYDRODIURIL) 25 MG tablet Take 25 mg by mouth daily.      hyoscyamine (ANASPAZ) 0.125 MG TBDP disintergrating tablet Place 0.125  mg under the tongue every 6 (six) hours as needed for cramping.      lisinopril (PRINIVIL,ZESTRIL) 5 MG tablet Take 5 mg by mouth daily.      Multiple Vitamins-Minerals (AIRBORNE GUMMIES PO) Take 2 each by mouth daily.      pantoprazole (PROTONIX) 40 MG tablet Take 40 mg by mouth daily as needed (acid reflux).       psyllium (METAMUCIL) 58.6 % powder Take 1 packet by mouth daily.      warfarin (COUMADIN) 5 MG tablet Take 5 mg by mouth daily.       amoxicillin (AMOXIL) 500 MG capsule Take 500 mg by mouth 2 (two) times daily. FOR DENTIST      Probiotic Product (PROBIOTIC DAILY PO) Take 1 tablet by mouth daily.      Allergies  Allergen Reactions   Doxycycline  Diarrhea   Sulfa Antibiotics Rash    Social History   Tobacco Use   Smoking status: Never Smoker   Smokeless tobacco: Never Used  Substance Use Topics   Alcohol use: No    Family History  Problem Relation Age of Onset   Hypertension Mother    Stroke Mother    Heart attack Father    Stroke Father    Hypertension Sister    Heart attack Sister        X2   Atrial fibrillation Sister        X2   Heart attack Paternal Grandfather    Stroke Sister      Review of Systems  Constitutional: Negative for chills and fever.  Respiratory: Negative for cough and sputum production.   Cardiovascular: Negative for chest pain and palpitations.  Gastrointestinal: Negative for nausea and vomiting.  Musculoskeletal: Positive for joint pain.  Neurological: Negative for sensory change.    Objective:  Physical Exam Patient is a 68 year old female.  Well nourished and well developed. General: Alert and oriented x3, cooperative and pleasant, no acute distress. Head: normocephalic, atraumatic, neck supple. Eyes: EOMI. Respiratory: breath sounds clear in all fields, no wheezing, rales, or rhonchi. Cardiovascular: Regular rate and rhythm, no murmurs, gallops or rubs. Abdomen: non-tender to palpation and soft, normoactive bowel sounds.  Musculoskeletal: Right Hip Exam: The range of motion: Flexion to 90 degrees, No Internal Rotation, External Rotation to 10 degrees, and abduction to 10-20 degrees without discomfort. There is no tenderness over the greater trochanteric bursa.  Calves soft and nontender. Motor function intact in LE. Strength 5/5 LE bilaterally. Neuro: Distal pulses 2+. Sensation to light touch intact in LE.  Vital signs in last 24 hours:   Labs:  Estimated body mass index is 38.06 kg/m as calculated from the following:   Height as of 09/14/19: 5\' 7"  (1.702 m).   Weight as of 09/14/19: 110.2 kg.   Imaging Review Plain radiographs demonstrate severe  degenerative joint disease of the right hip(s). The bone quality appears to be adequate for age and reported activity level.  Assessment/Plan:  End stage arthritis, right hip(s)  The patient history, physical examination, clinical judgement of the provider and imaging studies are consistent with end stage degenerative joint disease of the right hip(s) and total hip arthroplasty is deemed medically necessary. The treatment options including medical management, injection therapy, arthroscopy and arthroplasty were discussed at length. The risks and benefits of total hip arthroplasty were presented and reviewed. The risks due to aseptic loosening, infection, stiffness, dislocation/subluxation,  thromboembolic complications and other imponderables were discussed.  The patient acknowledged the explanation,  agreed to proceed with the plan and consent was signed. Patient is being admitted for inpatient treatment for surgery, pain control, PT, OT, prophylactic antibiotics, VTE prophylaxis, progressive ambulation and ADL's and discharge planning.The patient is planning to be discharged home.   Therapy Plans: HEP Disposition: Home with husband Planned DVT Prophylaxis: Coumadin (hx of DVT) DME needed: none PCP: Dr. Clovis Riley, clearance received TXA: topical Allergies: Sulfa drugs - itching, Doxycycline - nausea/diarrhea Anesthesia Concerns: nausea BMI: 37.7  Other: Hx of 3 previous DVTs, on Coumadin.  - Patient was instructed on what medications to stop prior to surgery. - Follow-up visit in 2 weeks with Dr. Lequita Halt - Begin physical therapy following surgery - Pre-operative lab work as pre-surgical testing - Prescriptions will be provided in hospital at time of discharge  Dennie Bible, PA-C Orthopedic Surgery [facility] [place] [phone]   \

## 2019-09-20 ENCOUNTER — Inpatient Hospital Stay (HOSPITAL_COMMUNITY)
Admission: RE | Admit: 2019-09-20 | Discharge: 2019-09-21 | DRG: 470 | Disposition: A | Discharge status: Home / Self Care | Payer: Medicare Other | Attending: Orthopedic Surgery | Admitting: Orthopedic Surgery

## 2019-09-20 ENCOUNTER — Encounter (HOSPITAL_COMMUNITY)
Admission: RE | Disposition: A | Discharge status: Home / Self Care | Payer: Self-pay | Source: Home / Self Care | Attending: Orthopedic Surgery

## 2019-09-20 ENCOUNTER — Encounter (HOSPITAL_COMMUNITY): Payer: Self-pay | Admitting: *Deleted

## 2019-09-20 ENCOUNTER — Inpatient Hospital Stay (HOSPITAL_COMMUNITY): Payer: Medicare Other

## 2019-09-20 ENCOUNTER — Inpatient Hospital Stay (HOSPITAL_COMMUNITY): Payer: Medicare Other | Admitting: Physician Assistant

## 2019-09-20 ENCOUNTER — Inpatient Hospital Stay (HOSPITAL_COMMUNITY): Payer: Medicare Other | Admitting: Certified Registered Nurse Anesthetist

## 2019-09-20 ENCOUNTER — Other Ambulatory Visit: Payer: Self-pay

## 2019-09-20 DIAGNOSIS — Z881 Allergy status to other antibiotic agents status: Secondary | ICD-10-CM

## 2019-09-20 DIAGNOSIS — M1611 Unilateral primary osteoarthritis, right hip: Secondary | ICD-10-CM | POA: Diagnosis present

## 2019-09-20 DIAGNOSIS — Z96642 Presence of left artificial hip joint: Secondary | ICD-10-CM | POA: Diagnosis present

## 2019-09-20 DIAGNOSIS — M25551 Pain in right hip: Secondary | ICD-10-CM | POA: Diagnosis present

## 2019-09-20 DIAGNOSIS — Z79899 Other long term (current) drug therapy: Secondary | ICD-10-CM

## 2019-09-20 DIAGNOSIS — Z8672 Personal history of thrombophlebitis: Secondary | ICD-10-CM

## 2019-09-20 DIAGNOSIS — M169 Osteoarthritis of hip, unspecified: Secondary | ICD-10-CM | POA: Diagnosis present

## 2019-09-20 DIAGNOSIS — Z7951 Long term (current) use of inhaled steroids: Secondary | ICD-10-CM | POA: Diagnosis not present

## 2019-09-20 DIAGNOSIS — Z7901 Long term (current) use of anticoagulants: Secondary | ICD-10-CM | POA: Diagnosis not present

## 2019-09-20 DIAGNOSIS — Z823 Family history of stroke: Secondary | ICD-10-CM | POA: Diagnosis not present

## 2019-09-20 DIAGNOSIS — Z9049 Acquired absence of other specified parts of digestive tract: Secondary | ICD-10-CM | POA: Diagnosis not present

## 2019-09-20 DIAGNOSIS — Z86718 Personal history of other venous thrombosis and embolism: Secondary | ICD-10-CM | POA: Diagnosis not present

## 2019-09-20 DIAGNOSIS — I10 Essential (primary) hypertension: Secondary | ICD-10-CM | POA: Diagnosis present

## 2019-09-20 DIAGNOSIS — M810 Age-related osteoporosis without current pathological fracture: Secondary | ICD-10-CM | POA: Diagnosis present

## 2019-09-20 DIAGNOSIS — Z8249 Family history of ischemic heart disease and other diseases of the circulatory system: Secondary | ICD-10-CM | POA: Diagnosis not present

## 2019-09-20 DIAGNOSIS — Z882 Allergy status to sulfonamides status: Secondary | ICD-10-CM

## 2019-09-20 DIAGNOSIS — Z96649 Presence of unspecified artificial hip joint: Secondary | ICD-10-CM

## 2019-09-20 DIAGNOSIS — I447 Left bundle-branch block, unspecified: Secondary | ICD-10-CM | POA: Diagnosis present

## 2019-09-20 DIAGNOSIS — Z419 Encounter for procedure for purposes other than remedying health state, unspecified: Secondary | ICD-10-CM

## 2019-09-20 HISTORY — PX: TOTAL HIP ARTHROPLASTY: SHX124

## 2019-09-20 LAB — TYPE AND SCREEN
ABO/RH(D): B POS
Antibody Screen: NEGATIVE

## 2019-09-20 LAB — PROTIME-INR
INR: 0.9 (ref 0.8–1.2)
Prothrombin Time: 12.5 seconds (ref 11.4–15.2)

## 2019-09-20 SURGERY — ARTHROPLASTY, HIP, TOTAL, ANTERIOR APPROACH
Anesthesia: Spinal | Laterality: Right

## 2019-09-20 MED ORDER — MORPHINE SULFATE (PF) 2 MG/ML IV SOLN: 0.5000 mg | INTRAVENOUS | Status: DC | PRN

## 2019-09-20 MED ORDER — OXYCODONE HCL 5 MG PO TABS: 5.0000 mg | ORAL_TABLET | Freq: Once | ORAL | Status: DC | PRN

## 2019-09-20 MED ORDER — BUPIVACAINE HCL (PF) 0.25 % IJ SOLN
INTRAMUSCULAR | Status: AC
  Filled 2019-09-20: qty 30

## 2019-09-20 MED ORDER — HYDROCHLOROTHIAZIDE 25 MG PO TABS
25.0000 mg | ORAL_TABLET | Freq: Every day | ORAL | Status: DC
  Administered 2019-09-21: 25 mg via ORAL
  Filled 2019-09-20: qty 1

## 2019-09-20 MED ORDER — TRAMADOL HCL 50 MG PO TABS: 50.0000 mg | ORAL_TABLET | Freq: Four times a day (QID) | ORAL | Status: DC | PRN

## 2019-09-20 MED ORDER — HYDROMORPHONE HCL 1 MG/ML IJ SOLN
INTRAMUSCULAR | Status: AC
  Administered 2019-09-20: 0.5 mg via INTRAVENOUS
  Filled 2019-09-20: qty 1

## 2019-09-20 MED ORDER — 0.9 % SODIUM CHLORIDE (POUR BTL) OPTIME
TOPICAL | Status: DC | PRN
  Administered 2019-09-20: 12:00:00 1000 mL

## 2019-09-20 MED ORDER — PROMETHAZINE HCL 25 MG/ML IJ SOLN: 6.2500 mg | INTRAMUSCULAR | Status: DC | PRN

## 2019-09-20 MED ORDER — METHOCARBAMOL 500 MG PO TABS
500.0000 mg | ORAL_TABLET | Freq: Four times a day (QID) | ORAL | Status: DC | PRN
  Administered 2019-09-20 – 2019-09-21 (×4): 500 mg via ORAL
  Filled 2019-09-20 (×4): qty 1

## 2019-09-20 MED ORDER — WATER FOR IRRIGATION, STERILE IR SOLN
Status: DC | PRN
  Administered 2019-09-20: 2000 mL

## 2019-09-20 MED ORDER — HYDROCODONE-ACETAMINOPHEN 5-325 MG PO TABS
1.0000 | ORAL_TABLET | ORAL | Status: DC | PRN
  Administered 2019-09-20: 2 via ORAL
  Administered 2019-09-20: 1 via ORAL
  Administered 2019-09-21 (×3): 2 via ORAL
  Filled 2019-09-20 (×3): qty 2
  Filled 2019-09-20: qty 1
  Filled 2019-09-20: qty 2

## 2019-09-20 MED ORDER — ONDANSETRON HCL 4 MG/2ML IJ SOLN
INTRAMUSCULAR | Status: DC | PRN
  Administered 2019-09-20: 4 mg via INTRAVENOUS

## 2019-09-20 MED ORDER — METHOCARBAMOL 500 MG IVPB - SIMPLE MED
INTRAVENOUS | Status: AC
  Filled 2019-09-20: qty 50

## 2019-09-20 MED ORDER — METOCLOPRAMIDE HCL 5 MG/ML IJ SOLN: 5.0000 mg | Freq: Three times a day (TID) | INTRAMUSCULAR | Status: DC | PRN

## 2019-09-20 MED ORDER — METOCLOPRAMIDE HCL 5 MG PO TABS: 5.0000 mg | ORAL_TABLET | Freq: Three times a day (TID) | ORAL | Status: DC | PRN

## 2019-09-20 MED ORDER — METHOCARBAMOL 500 MG IVPB - SIMPLE MED
500.0000 mg | Freq: Four times a day (QID) | INTRAVENOUS | Status: DC | PRN
  Administered 2019-09-20: 14:00:00 500 mg via INTRAVENOUS
  Filled 2019-09-20: qty 50

## 2019-09-20 MED ORDER — FENTANYL CITRATE (PF) 100 MCG/2ML IJ SOLN
INTRAMUSCULAR | Status: AC
  Filled 2019-09-20: qty 2

## 2019-09-20 MED ORDER — HYOSCYAMINE SULFATE 0.125 MG PO TBDP
0.1250 mg | ORAL_TABLET | Freq: Four times a day (QID) | ORAL | Status: DC | PRN
  Filled 2019-09-20: qty 1

## 2019-09-20 MED ORDER — CEFAZOLIN SODIUM-DEXTROSE 2-4 GM/100ML-% IV SOLN
2.0000 g | Freq: Four times a day (QID) | INTRAVENOUS | Status: AC
  Administered 2019-09-20 (×2): 2 g via INTRAVENOUS
  Filled 2019-09-20 (×2): qty 100

## 2019-09-20 MED ORDER — LACTATED RINGERS IV SOLN
INTRAVENOUS | Status: DC
  Administered 2019-09-20 (×2): via INTRAVENOUS

## 2019-09-20 MED ORDER — CEFAZOLIN SODIUM-DEXTROSE 2-4 GM/100ML-% IV SOLN
2.0000 g | INTRAVENOUS | Status: AC
  Administered 2019-09-20: 2 g via INTRAVENOUS
  Filled 2019-09-20: qty 100

## 2019-09-20 MED ORDER — BUPIVACAINE HCL 0.25 % IJ SOLN
INTRAMUSCULAR | Status: DC | PRN
  Administered 2019-09-20: 30 mL via INTRA_ARTICULAR

## 2019-09-20 MED ORDER — ONDANSETRON HCL 4 MG PO TABS: 4.0000 mg | ORAL_TABLET | Freq: Four times a day (QID) | ORAL | Status: DC | PRN

## 2019-09-20 MED ORDER — BISACODYL 10 MG RE SUPP: 10.0000 mg | Freq: Every day | RECTAL | Status: DC | PRN

## 2019-09-20 MED ORDER — GUAIFENESIN ER 600 MG PO TB12: 600.0000 mg | ORAL_TABLET | Freq: Two times a day (BID) | ORAL | Status: DC | PRN

## 2019-09-20 MED ORDER — DIPHENHYDRAMINE HCL 12.5 MG/5ML PO ELIX: 12.5000 mg | ORAL_SOLUTION | ORAL | Status: DC | PRN

## 2019-09-20 MED ORDER — TRANEXAMIC ACID-NACL 1000-0.7 MG/100ML-% IV SOLN: 1000.0000 mg | INTRAVENOUS | Status: DC

## 2019-09-20 MED ORDER — PHENYLEPHRINE 40 MCG/ML (10ML) SYRINGE FOR IV PUSH (FOR BLOOD PRESSURE SUPPORT)
PREFILLED_SYRINGE | INTRAVENOUS | Status: AC
  Filled 2019-09-20: qty 10

## 2019-09-20 MED ORDER — ACETAMINOPHEN 10 MG/ML IV SOLN
INTRAVENOUS | Status: DC | PRN
  Administered 2019-09-20: 1000 mg via INTRAVENOUS

## 2019-09-20 MED ORDER — MIDAZOLAM HCL 5 MG/5ML IJ SOLN
INTRAMUSCULAR | Status: DC | PRN
  Administered 2019-09-20: 2 mg via INTRAVENOUS

## 2019-09-20 MED ORDER — PROPOFOL 10 MG/ML IV BOLUS
INTRAVENOUS | Status: AC
  Filled 2019-09-20: qty 20

## 2019-09-20 MED ORDER — PHENOL 1.4 % MT LIQD: 1.0000 | OROMUCOSAL | Status: DC | PRN

## 2019-09-20 MED ORDER — ACETAMINOPHEN 500 MG PO TABS
500.0000 mg | ORAL_TABLET | Freq: Four times a day (QID) | ORAL | Status: DC
  Administered 2019-09-20: 500 mg via ORAL
  Filled 2019-09-20: qty 1

## 2019-09-20 MED ORDER — ONDANSETRON HCL 4 MG/2ML IJ SOLN: 4.0000 mg | Freq: Four times a day (QID) | INTRAMUSCULAR | Status: DC | PRN

## 2019-09-20 MED ORDER — CHLORHEXIDINE GLUCONATE 4 % EX LIQD: 60.0000 mL | Freq: Once | CUTANEOUS | Status: DC

## 2019-09-20 MED ORDER — FLUTICASONE PROPIONATE 50 MCG/ACT NA SUSP: 1.0000 | Freq: Every day | NASAL | Status: DC | PRN

## 2019-09-20 MED ORDER — MENTHOL 3 MG MT LOZG: 1.0000 | LOZENGE | OROMUCOSAL | Status: DC | PRN

## 2019-09-20 MED ORDER — POLYETHYLENE GLYCOL 3350 17 G PO PACK
17.0000 g | PACK | Freq: Every day | ORAL | Status: DC | PRN
  Administered 2019-09-21: 11:00:00 17 g via ORAL
  Filled 2019-09-20: qty 1

## 2019-09-20 MED ORDER — AZELASTINE HCL 0.1 % NA SOLN: 1.0000 | Freq: Two times a day (BID) | NASAL | Status: DC | PRN

## 2019-09-20 MED ORDER — PROPOFOL 500 MG/50ML IV EMUL
INTRAVENOUS | Status: DC | PRN
  Administered 2019-09-20: 75 ug/kg/min via INTRAVENOUS

## 2019-09-20 MED ORDER — LORATADINE 10 MG PO TABS
10.0000 mg | ORAL_TABLET | Freq: Every day | ORAL | Status: DC
  Administered 2019-09-21: 11:00:00 10 mg via ORAL
  Filled 2019-09-20: qty 1

## 2019-09-20 MED ORDER — POVIDONE-IODINE 10 % EX SWAB
2.0000 "application " | Freq: Once | CUTANEOUS | Status: AC
  Administered 2019-09-20: 2 via TOPICAL

## 2019-09-20 MED ORDER — BUPIVACAINE HCL (PF) 0.75 % IJ SOLN
INTRAMUSCULAR | Status: DC | PRN
  Administered 2019-09-20: 1.8 mL via INTRATHECAL

## 2019-09-20 MED ORDER — SODIUM CHLORIDE 0.9 % IV SOLN
INTRAVENOUS | Status: DC
  Administered 2019-09-20: 17:00:00 via INTRAVENOUS

## 2019-09-20 MED ORDER — ACETAMINOPHEN 10 MG/ML IV SOLN
INTRAVENOUS | Status: AC
  Filled 2019-09-20: qty 100

## 2019-09-20 MED ORDER — PHENYLEPHRINE HCL (PRESSORS) 10 MG/ML IV SOLN
INTRAVENOUS | Status: DC | PRN
  Administered 2019-09-20 (×3): 80 ug via INTRAVENOUS

## 2019-09-20 MED ORDER — EPHEDRINE SULFATE 50 MG/ML IJ SOLN
INTRAMUSCULAR | Status: DC | PRN
  Administered 2019-09-20 (×4): 10 mg via INTRAVENOUS

## 2019-09-20 MED ORDER — HYDROMORPHONE HCL 1 MG/ML IJ SOLN
0.2500 mg | INTRAMUSCULAR | Status: DC | PRN
  Administered 2019-09-20 (×2): 0.5 mg via INTRAVENOUS

## 2019-09-20 MED ORDER — DEXAMETHASONE SODIUM PHOSPHATE 10 MG/ML IJ SOLN
8.0000 mg | Freq: Once | INTRAMUSCULAR | Status: AC
  Administered 2019-09-20: 10:00:00 8 mg via INTRAVENOUS

## 2019-09-20 MED ORDER — EPHEDRINE 5 MG/ML INJ
INTRAVENOUS | Status: AC
  Filled 2019-09-20: qty 10

## 2019-09-20 MED ORDER — FENTANYL CITRATE (PF) 100 MCG/2ML IJ SOLN
INTRAMUSCULAR | Status: DC | PRN
  Administered 2019-09-20: 100 ug via INTRAVENOUS

## 2019-09-20 MED ORDER — MIDAZOLAM HCL 2 MG/2ML IJ SOLN
INTRAMUSCULAR | Status: AC
  Filled 2019-09-20: qty 2

## 2019-09-20 MED ORDER — DOCUSATE SODIUM 100 MG PO CAPS
100.0000 mg | ORAL_CAPSULE | Freq: Two times a day (BID) | ORAL | Status: DC
  Administered 2019-09-20 – 2019-09-21 (×2): 100 mg via ORAL
  Filled 2019-09-20 (×2): qty 1

## 2019-09-20 MED ORDER — TRANEXAMIC ACID 1000 MG/10ML IV SOLN
2000.0000 mg | Freq: Once | INTRAVENOUS | Status: AC
  Administered 2019-09-20: 2000 mg via TOPICAL
  Filled 2019-09-20: qty 20

## 2019-09-20 MED ORDER — ENOXAPARIN SODIUM 40 MG/0.4ML ~~LOC~~ SOLN
40.0000 mg | SUBCUTANEOUS | Status: DC
  Administered 2019-09-21: 08:00:00 40 mg via SUBCUTANEOUS
  Filled 2019-09-20: qty 0.4

## 2019-09-20 MED ORDER — OXYCODONE HCL 5 MG/5ML PO SOLN: 5.0000 mg | Freq: Once | ORAL | Status: DC | PRN

## 2019-09-20 MED ORDER — WARFARIN SODIUM 5 MG PO TABS
5.0000 mg | ORAL_TABLET | Freq: Once | ORAL | Status: AC
  Administered 2019-09-20: 5 mg via ORAL
  Filled 2019-09-20: qty 1

## 2019-09-20 MED ORDER — PANTOPRAZOLE SODIUM 40 MG PO TBEC: 40.0000 mg | DELAYED_RELEASE_TABLET | Freq: Every day | ORAL | Status: DC | PRN

## 2019-09-20 MED ORDER — PROPOFOL 10 MG/ML IV BOLUS
INTRAVENOUS | Status: AC
  Filled 2019-09-20: qty 60

## 2019-09-20 MED ORDER — WARFARIN - PHARMACIST DOSING INPATIENT: Freq: Every day | Status: DC

## 2019-09-20 MED ORDER — DEXAMETHASONE SODIUM PHOSPHATE 10 MG/ML IJ SOLN
10.0000 mg | Freq: Once | INTRAMUSCULAR | Status: AC
  Administered 2019-09-21: 10 mg via INTRAVENOUS
  Filled 2019-09-20: qty 1

## 2019-09-20 MED ORDER — FLEET ENEMA 7-19 GM/118ML RE ENEM: 1.0000 | ENEMA | Freq: Once | RECTAL | Status: DC | PRN

## 2019-09-20 SURGICAL SUPPLY — 47 items
BAG DECANTER FOR FLEXI CONT (MISCELLANEOUS) ×1 IMPLANT
BAG SPEC THK2 15X12 ZIP CLS (MISCELLANEOUS) ×1
BAG ZIPLOCK 12X15 (MISCELLANEOUS) ×1 IMPLANT
BLADE SAG 18X100X1.27 (BLADE) ×2 IMPLANT
COVER PERINEAL POST (MISCELLANEOUS) ×2 IMPLANT
COVER SURGICAL LIGHT HANDLE (MISCELLANEOUS) ×2 IMPLANT
COVER WAND RF STERILE (DRAPES) IMPLANT
CUP ACET PINNACLE SECTR 50MM (Hips) IMPLANT
DECANTER SPIKE VIAL GLASS SM (MISCELLANEOUS) ×2 IMPLANT
DRAPE STERI IOBAN 125X83 (DRAPES) ×2 IMPLANT
DRAPE U-SHAPE 47X51 STRL (DRAPES) ×5 IMPLANT
DRSG ADAPTIC 3X8 NADH LF (GAUZE/BANDAGES/DRESSINGS) ×2 IMPLANT
DRSG MEPILEX BORDER 4X4 (GAUZE/BANDAGES/DRESSINGS) ×2 IMPLANT
DRSG MEPILEX BORDER 4X8 (GAUZE/BANDAGES/DRESSINGS) ×2 IMPLANT
DURAPREP 26ML APPLICATOR (WOUND CARE) ×2 IMPLANT
ELECT REM PT RETURN 15FT ADLT (MISCELLANEOUS) ×2 IMPLANT
EVACUATOR 1/8 PVC DRAIN (DRAIN) ×2 IMPLANT
GLOVE BIO SURGEON STRL SZ 6 (GLOVE) ×1 IMPLANT
GLOVE BIO SURGEON STRL SZ7 (GLOVE) ×1 IMPLANT
GLOVE BIO SURGEON STRL SZ8 (GLOVE) ×2 IMPLANT
GLOVE BIOGEL PI IND STRL 6.5 (GLOVE) IMPLANT
GLOVE BIOGEL PI IND STRL 7.0 (GLOVE) IMPLANT
GLOVE BIOGEL PI IND STRL 8 (GLOVE) ×1 IMPLANT
GLOVE BIOGEL PI INDICATOR 6.5 (GLOVE) ×1
GLOVE BIOGEL PI INDICATOR 7.0 (GLOVE) ×2
GLOVE BIOGEL PI INDICATOR 8 (GLOVE) ×2
GOWN STRL REUS W/TWL LRG LVL3 (GOWN DISPOSABLE) ×2 IMPLANT
GOWN STRL REUS W/TWL XL LVL3 (GOWN DISPOSABLE) ×1 IMPLANT
HEAD FEMORAL 32 CERAMIC (Hips) ×1 IMPLANT
HOLDER FOLEY CATH W/STRAP (MISCELLANEOUS) ×2 IMPLANT
KIT TURNOVER KIT A (KITS) IMPLANT
LINER MARATHON 32 50 (Hips) ×1 IMPLANT
MANIFOLD NEPTUNE II (INSTRUMENTS) ×2 IMPLANT
PACK ANTERIOR HIP CUSTOM (KITS) ×2 IMPLANT
PINNACLE SECTOR CUP 50MM (Hips) ×2 IMPLANT
SLEEVE SURGEON STRL (DRAPES) ×1 IMPLANT
STEM FEMORAL SZ6 HIGH ACTIS (Stem) ×1 IMPLANT
STRIP CLOSURE SKIN 1/2X4 (GAUZE/BANDAGES/DRESSINGS) ×3 IMPLANT
SUT ETHIBOND NAB CT1 #1 30IN (SUTURE) ×2 IMPLANT
SUT MNCRL AB 4-0 PS2 18 (SUTURE) ×2 IMPLANT
SUT STRATAFIX 0 PDS 27 VIOLET (SUTURE) ×2
SUT VIC AB 2-0 CT1 27 (SUTURE) ×4
SUT VIC AB 2-0 CT1 TAPERPNT 27 (SUTURE) ×2 IMPLANT
SUTURE STRATFX 0 PDS 27 VIOLET (SUTURE) ×1 IMPLANT
SYR 50ML LL SCALE MARK (SYRINGE) ×1 IMPLANT
TRAY FOLEY MTR SLVR 16FR STAT (SET/KITS/TRAYS/PACK) ×2 IMPLANT
YANKAUER SUCT BULB TIP 10FT TU (MISCELLANEOUS) ×2 IMPLANT

## 2019-09-20 NOTE — Transfer of Care (Signed)
Immediate Anesthesia Transfer of Care Note  Patient: Ann Long  Procedure(s) Performed: TOTAL HIP ARTHROPLASTY ANTERIOR APPROACH (Right )  Patient Location: PACU  Anesthesia Type:Spinal  Level of Consciousness: sedated, patient cooperative and responds to stimulation  Airway & Oxygen Therapy: Patient Spontanous Breathing and Patient connected to face mask oxygen  Post-op Assessment: Report given to RN and Post -op Vital signs reviewed and stable  Post vital signs: Reviewed and stable  Last Vitals:  Vitals Value Taken Time  BP 112/67 09/20/19 1210  Temp 36.4 C 09/20/19 1210  Pulse 79 09/20/19 1211  Resp 14 09/20/19 1213  SpO2 100 % 09/20/19 1211  Vitals shown include unvalidated device data.  Last Pain:  Vitals:   09/20/19 1210  TempSrc:   PainSc: Asleep      Patients Stated Pain Goal: 4 (11/57/26 2035)  Complications: No apparent anesthesia complications

## 2019-09-20 NOTE — Op Note (Signed)
OPERATIVE REPORT- TOTAL HIP ARTHROPLASTY   PREOPERATIVE DIAGNOSIS: Osteoarthritis of the Right hip.   POSTOPERATIVE DIAGNOSIS: Osteoarthritis of the Right  hip.   PROCEDURE: Right total hip arthroplasty, anterior approach.   SURGEON: Gaynelle Arabian, MD   ASSISTANT: Molli Barrows, PA-C  ANESTHESIA:  Spinal  ESTIMATED BLOOD LOSS:-350 mL    DRAINS: Hemovac x1.   COMPLICATIONS: None   CONDITION: PACU - hemodynamically stable.   BRIEF CLINICAL NOTE: Ann Long is a 68 y.o. female who has advanced end-  stage arthritis of their Right  hip with progressively worsening pain and  dysfunction.The patient has failed nonoperative management and presents for  total hip arthroplasty.   PROCEDURE IN DETAIL: After successful administration of spinal  anesthetic, the traction boots for the [facility] bed were placed on both  feet and the patient was placed onto the [facility] bed, boots placed into the leg  holders. The Right hip was then isolated from the perineum with plastic  drapes and prepped and draped in the usual sterile fashion. ASIS and  greater trochanter were marked and a oblique incision was made, starting  at about 1 cm lateral and 2 cm distal to the ASIS and coursing towards  the anterior cortex of the femur. The skin was cut with a 10 blade  through subcutaneous tissue to the level of the fascia overlying the  tensor fascia lata muscle. The fascia was then incised in line with the  incision at the junction of the anterior third and posterior 2/3rd. The  muscle was teased off the fascia and then the interval between the TFL  and the rectus was developed. The Hohmann retractor was then placed at  the top of the femoral neck over the capsule. The vessels overlying the  capsule were cauterized and the fat on top of the capsule was removed.  A Hohmann retractor was then placed anterior underneath the rectus  femoris to give exposure to the entire anterior capsule. A T-shaped   capsulotomy was performed. The edges were tagged and the femoral head  was identified.       Osteophytes are removed off the superior acetabulum.  The femoral neck was then cut in situ with an oscillating saw. Traction  was then applied to the left lower extremity utilizing the [facility]  traction. The femoral head was then removed. Retractors were placed  around the acetabulum and then circumferential removal of the labrum was  performed. Osteophytes were also removed. Reaming starts at 47 mm to  medialize and  Increased in 2 mm increments to 49 mm. We reamed in  approximately 40 degrees of abduction, 20 degrees anteversion. A 50 mm  pinnacle acetabular shell was then impacted in anatomic position under  fluoroscopic guidance with excellent purchase. We did not need to place  any additional dome screws. A 32 mm neutral + 4 marathon liner was then  placed into the acetabular shell.       The femoral lift was then placed along the lateral aspect of the femur  just distal to the vastus ridge. The leg was  externally rotated and capsule  was stripped off the inferior aspect of the femoral neck down to the  level of the lesser trochanter, this was done with electrocautery. The femur was lifted after this was performed. The  leg was then placed in an extended and adducted position essentially delivering the femur. We also removed the capsule superiorly and the piriformis from the piriformis  fossa to gain excellent exposure of the  proximal femur. Rongeur was used to remove some cancellous bone to get  into the lateral portion of the proximal femur for placement of the  initial starter reamer. The starter broaches was placed  the starter broach  and was shown to go down the center of the canal. Broaching  with the Actis system was then performed starting at size 0  coursing  Up to size 6. A size 6 had excellent torsional and rotational  and axial stability. The trial high offset neck was then placed   with a 32 + 1 trial head. The hip was then reduced. We confirmed that  the stem was in the canal both on AP and lateral x-rays. It also has excellent sizing. The hip was reduced with outstanding stability through full extension and full external rotation.. AP pelvis was taken and the leg lengths were measured and found to be equal. Hip was then dislocated again and the femoral head and neck removed. The  femoral broach was removed. Size 6 Actis stem with a high offset  neck was then impacted into the femur following native anteversion. Has  excellent purchase in the canal. Excellent torsional and rotational and  axial stability. It is confirmed to be in the canal on AP and lateral  fluoroscopic views. The 32 + 1 ceramic head was placed and the hip  reduced with outstanding stability. Again AP pelvis was taken and it  confirmed that the leg lengths were equal. The wound was then copiously  irrigated with saline solution and the capsule reattached and repaired  with Ethibond suture. 30 ml of .25% Bupivicaine was  injected into the capsule and into the edge of the tensor fascia lata as well as subcutaneous tissue. The fascia overlying the tensor fascia lata was then closed with a running #1 V-Loc. Subcu was closed with interrupted 2-0 Vicryl and subcuticular running 4-0 Monocryl. Incision was cleaned  and dried. Steri-Strips and a bulky sterile dressing applied. Hemovac  drain was hooked to suction and then the patient was awakened and transported to  recovery in stable condition.        Please note that a surgical assistant was a medical necessity for this procedure to perform it in a safe and expeditious manner. Assistant was necessary to provide appropriate retraction of vital neurovascular structures and to prevent femoral fracture and allow for anatomic placement of the prosthesis.  Gaynelle Arabian, M.D.

## 2019-09-20 NOTE — Discharge Instructions (Signed)
°Dr. Frank Aluisio °Total Joint Specialist °[facility] °[address] °, [place] [address] °[phone] ° °ANTERIOR APPROACH TOTAL HIP REPLACEMENT POSTOPERATIVE DIRECTIONS ° ° °Hip Rehabilitation, Guidelines Following Surgery  °The results of a hip operation are greatly improved after range of motion and muscle strengthening exercises. Follow all safety measures which are given to protect your hip. If any of these exercises cause increased pain or swelling in your joint, decrease the amount until you are comfortable again. Then slowly increase the exercises. Call your caregiver if you have problems or questions.  ° °HOME CARE INSTRUCTIONS  °• Remove items at home which could result in a fall. This includes throw rugs or furniture in walking pathways.  °· ICE to the affected hip every three hours for 30 minutes at a time and then as needed for pain and swelling.  Continue to use ice on the hip for pain and swelling from surgery. You may notice swelling that will progress down to the foot and ankle.  This is normal after surgery.  Elevate the leg when you are not up walking on it.   °· Continue to use the breathing machine which will help keep your temperature down.  It is common for your temperature to cycle up and down following surgery, especially at night when you are not up moving around and exerting yourself.  The breathing machine keeps your lungs expanded and your temperature down. ° °DIET °You may resume your previous home diet once your are discharged from the hospital. ° °DRESSING / WOUND CARE / SHOWERING °You may shower 3 days after surgery, but keep the wounds dry during showering.  You may use an occlusive plastic wrap (Press'n Seal for example), NO SOAKING/SUBMERGING IN THE BATHTUB.  If the bandage gets wet, change with a clean dry gauze.  If the incision gets wet, pat the wound dry with a clean towel. °You may start showering once you are discharged home but do not submerge the  incision under water. Just pat the incision dry and apply a dry gauze dressing on daily. °Change the surgical dressing daily and reapply a dry dressing each time. ° °ACTIVITY °Walk with your walker as instructed. °Use walker as long as suggested by your caregivers. °Avoid periods of inactivity such as sitting longer than an hour when not asleep. This helps prevent blood clots.  °You may resume a sexual relationship in one month or when given the OK by your doctor.  °You may return to work once you are cleared by your doctor.  °Do not drive a car for 6 weeks or until released by you surgeon.  °Do not drive while taking narcotics. ° °WEIGHT BEARING °Weight bearing as tolerated with assist device (walker, cane, etc) as directed, use it as long as suggested by your surgeon or therapist, typically at least 4-6 weeks. ° °POSTOPERATIVE CONSTIPATION PROTOCOL °Constipation - defined medically as fewer than three stools per week and severe constipation as less than one stool per week. ° °One of the most common issues patients have following surgery is constipation.  Even if you have a regular bowel pattern at home, your normal regimen is likely to be disrupted due to multiple reasons following surgery.  Combination of anesthesia, postoperative narcotics, change in appetite and fluid intake all can affect your bowels.  In order to avoid complications following surgery, here are some recommendations in order to help you during your recovery period. ° °Colace (docusate) - Pick up an over-the-counter form   of Colace or another stool softener and take twice a day as long as you are requiring postoperative pain medications.  Take with a full glass of water daily.  If you experience loose stools or diarrhea, hold the colace until you stool forms back up.  If your symptoms do not get better within 1 week or if they get worse, check with your doctor. ° °Dulcolax (bisacodyl) - Pick up over-the-counter and take as directed by the product  packaging as needed to assist with the movement of your bowels.  Take with a full glass of water.  Use this product as needed if not relieved by Colace only.  ° °MiraLax (polyethylene glycol) - Pick up over-the-counter to have on hand.  MiraLax is a solution that will increase the amount of water in your bowels to assist with bowel movements.  Take as directed and can mix with a glass of water, juice, soda, coffee, or tea.  Take if you go more than two days without a movement. °Do not use MiraLax more than once per day. Call your doctor if you are still constipated or irregular after using this medication for 7 days in a row. ° °If you continue to have problems with postoperative constipation, please contact the office for further assistance and recommendations.  If you experience "the worst abdominal pain ever" or develop nausea or vomiting, please contact the office immediatly for further recommendations for treatment. ° °ITCHING ° If you experience itching with your medications, try taking only a single pain pill, or even half a pain pill at a time.  You can also use Benadryl over the counter for itching or also to help with sleep.  ° °TED HOSE STOCKINGS °Wear the elastic stockings on both legs for three weeks following surgery during the day but you may remove then at night for sleeping. ° °MEDICATIONS °See your medication summary on the “After Visit Summary” that the nursing staff will review with you prior to discharge.  You may have some home medications which will be placed on hold until you complete the course of blood thinner medication.  It is important for you to complete the blood thinner medication as prescribed by your surgeon.  Continue your approved medications as instructed at time of discharge. ° °PRECAUTIONS °If you experience chest pain or shortness of breath - call 911 immediately for transfer to the hospital emergency department.  °If you develop a fever greater that 101 F, purulent drainage  from wound, increased redness or drainage from wound, foul odor from the wound/dressing, or calf pain - CONTACT YOUR SURGEON.   °                                                °FOLLOW-UP APPOINTMENTS °Make sure you keep all of your appointments after your operation with your surgeon and caregivers. You should call the office at the above phone number and make an appointment for approximately two weeks after the date of your surgery or on the date instructed by your surgeon outlined in the "After Visit Summary". ° °RANGE OF MOTION AND STRENGTHENING EXERCISES  °These exercises are designed to help you keep full movement of your hip joint. Follow your caregiver's or physical therapist's instructions. Perform all exercises about fifteen times, three times per day or as directed. Exercise both hips, even if you have   had only one joint replacement. These exercises can be done on a training (exercise) mat, on the floor, on a table or on a bed. Use whatever works the best and is most comfortable for you. Use music or television while you are exercising so that the exercises are a pleasant break in your day. This will make your life better with the exercises acting as a break in routine you can look forward to.  °• Lying on your back, slowly slide your foot toward your buttocks, raising your knee up off the floor. Then slowly slide your foot back down until your leg is straight again.  °• Lying on your back spread your legs as far apart as you can without causing discomfort.  °• Lying on your side, raise your upper leg and foot straight up from the floor as far as is comfortable. Slowly lower the leg and repeat.  °• Lying on your back, tighten up the muscle in the front of your thigh (quadriceps muscles). You can do this by keeping your leg straight and trying to raise your heel off the floor. This helps strengthen the largest muscle supporting your knee.  °• Lying on your back, tighten up the muscles of your buttocks both  with the legs straight and with the knee bent at a comfortable angle while keeping your heel on the floor.  ° °IF YOU ARE TRANSFERRED TO A SKILLED REHAB FACILITY °If the patient is transferred to a skilled rehab facility following release from the hospital, a list of the current medications will be sent to the facility for the patient to continue.  When discharged from the skilled rehab facility, please have the facility set up the patient's Home Health Physical Therapy prior to being released. Also, the skilled facility will be responsible for providing the patient with their medications at time of release from the facility to include their pain medication, the muscle relaxants, and their blood thinner medication. If the patient is still at the rehab facility at time of the two week follow up appointment, the skilled rehab facility will also need to assist the patient in arranging follow up appointment in our office and any transportation needs. ° °MAKE SURE YOU:  °• Understand these instructions.  °• Get help right away if you are not doing well or get worse.  ° ° °Pick up stool softner and laxative for home use following surgery while on pain medications. °Do not submerge incision under water. °Please use good hand washing techniques while changing dressing each day. °May shower starting three days after surgery. °Please use a clean towel to pat the incision dry following showers. °Continue to use ice for pain and swelling after surgery. °Do not use any lotions or creams on the incision until instructed by your surgeon. ° °

## 2019-09-20 NOTE — Anesthesia Procedure Notes (Signed)
Spinal  Patient location during procedure: OR Start time: 09/20/2019 10:15 AM End time: 09/20/2019 10:21 AM Staffing Resident/CRNA: Gean Maidens, CRNA Performed: resident/CRNA  Preanesthetic Checklist Completed: patient identified, site marked, surgical consent, pre-op evaluation, timeout performed, IV checked, risks and benefits discussed and monitors and equipment checked Spinal Block Patient position: sitting Prep: DuraPrep Patient monitoring: heart rate, continuous pulse ox and blood pressure Approach: midline Location: L4-5 Injection technique: single-shot Needle Needle type: Pencan  Needle gauge: 24 G Needle length: 9 cm Needle insertion depth: 7 cm Additional Notes Pt sitting position sterile prep and drape negative paresthesia/heme

## 2019-09-20 NOTE — Plan of Care (Signed)
Continue current POC 

## 2019-09-20 NOTE — Anesthesia Postprocedure Evaluation (Signed)
Anesthesia Post Note  Patient: Ann Long  Procedure(s) Performed: TOTAL HIP ARTHROPLASTY ANTERIOR APPROACH (Right )     Patient location during evaluation: PACU Anesthesia Type: Spinal Level of consciousness: oriented and awake and alert Pain management: pain level controlled Vital Signs Assessment: post-procedure vital signs reviewed and stable Respiratory status: spontaneous breathing and respiratory function stable Cardiovascular status: blood pressure returned to baseline and stable Postop Assessment: no headache, no backache and no apparent nausea or vomiting Anesthetic complications: no    Last Vitals:  Vitals:   09/20/19 1300 09/20/19 1315  BP: 130/78 130/84  Pulse: 72 65  Resp: 11 14  Temp:    SpO2: 100% 100%    Last Pain:  Vitals:   09/20/19 1300  TempSrc:   PainSc: 0-No pain                 Lynda Rainwater

## 2019-09-20 NOTE — Progress Notes (Signed)
ANTICOAGULATION CONSULT NOTE  Pharmacy Consult for warfarin Indication: DVT hx  Allergies  Allergen Reactions  . Doxycycline Diarrhea  . Sulfa Antibiotics Rash    Patient Measurements: Height: 5\' 7"  (170.2 cm) Weight: 243 lb (110.2 kg) IBW/kg (Calculated) : 61.6  Vital Signs: Temp: 97.5 F (36.4 C) (09/30 1430) Temp Source: Oral (09/30 0839) BP: 141/78 (09/30 1449) Pulse Rate: 53 (09/30 1449)  Labs: Recent Labs    09/20/19 0904  LABPROT 12.5  INR 0.9    Estimated Creatinine Clearance: 83.1 mL/min (by C-G formula based on SCr of 0.84 mg/dL).   Medical History: Past Medical History:  Diagnosis Date  . Abnormal vaginal Pap smear   . Arthritis   . 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 Gallbladder disease   . Heart murmur   . Hypertension   . Jaundice    as a child, her sister was diagnosed with it due to ingestion hepatitis, she later develped januce and thpugh was not testes herself , was presumed to also have the same time of hepatitis and was subsequesntly treated with globulin '  . Left bundle branch block    "my pcp told me years ago i had it , 2 -3 years ago my obgyn heard a heart murmur and sent me to [cardiology] where they did a ultrasound of my heart , they didnt find anything definitive" , denies chest pain , sob, syncope   . Osteoarthritis   . Osteoporosis   . Phlebitis   . PONV (postoperative nausea and vomiting)     Medications:  Medications Prior to Admission  Medication Sig Dispense Refill Last Dose  . azelastine (ASTELIN) 0.1 % nasal spray Place 1 spray into both nostrils 2 (two) times daily as needed for rhinitis or allergies. Use in each nostril as directed     . cetirizine (ZYRTEC) 10 MG tablet Take 10 mg by mouth daily.   Past Week at Unknown time  . cholecalciferol (VITAMIN D) 1000 units tablet Take  1,000 Units by mouth daily.   Past Week at Unknown time  . docusate sodium (COLACE) 100 MG capsule Take 100 mg by mouth daily.   Past Week at Unknown time  . enoxaparin (LOVENOX) 40 MG/0.4ML injection Inject 40 mg into the skin daily.   09/17/2019 at 1800  . fluticasone (FLONASE) 50 MCG/ACT nasal spray Place 1 spray into both nostrils daily as needed for allergies or rhinitis.   Past Week at Unknown time  . guaiFENesin (MUCINEX) 600 MG 12 hr tablet Take 600 mg by mouth 2 (two) times daily as needed for cough.   Past Week at Unknown time  . hydrochlorothiazide (HYDRODIURIL) 25 MG tablet Take 25 mg by mouth daily.   09/19/2019 at Unknown time  . hyoscyamine (ANASPAZ) 0.125 MG TBDP disintergrating tablet Place 0.125 mg under the tongue every 6 (six) hours as needed for cramping.   Past Week at Unknown time  . lisinopril (PRINIVIL,ZESTRIL) 5 MG tablet Take 5 mg by mouth daily.   09/19/2019 at Unknown time  . Multiple Vitamins-Minerals (AIRBORNE GUMMIES PO) Take 2 each by mouth daily.   Past Week at Unknown time  . pantoprazole (PROTONIX) 40 MG tablet Take 40 mg by mouth daily as needed (acid reflux).    09/19/2019 at Unknown time  . psyllium (METAMUCIL)  58.6 % powder Take 1 packet by mouth daily.   Past Week at Unknown time  . amoxicillin (AMOXIL) 500 MG capsule Take 500 mg by mouth 2 (two) times daily. FOR DENTIST     . Probiotic Product (PROBIOTIC DAILY PO) Take 1 tablet by mouth daily.     Marland Kitchen warfarin (COUMADIN) 5 MG tablet Take 5 mg by mouth daily.    09/12/2019 at 1800   Scheduled:  . acetaminophen  500 mg Oral Q6H  . [START ON 09/21/2019] dexamethasone (DECADRON) injection  10 mg Intravenous Once  . docusate sodium  100 mg Oral BID  . [START ON 09/21/2019] enoxaparin  40 mg Subcutaneous Q24H  . [START ON 09/21/2019] hydrochlorothiazide  25 mg Oral Daily  . [START ON 09/21/2019] loratadine  10 mg Oral Daily    Assessment: 47 yoF with PMH DVT on warfarin, now s/p R THA. Pharmacy consulted to resume  warfarin post-op   Baseline INR normal after holding warfarin > 7d  Prior anticoagulation: warfarin 5 mg daily; LD 9/22  Significant events:  Today, 09/20/2019:  CBC: pending inpatient; preop CBC WNL  INR non-therapeutic  Major drug interactions: none noted  No bleeding issues per nursing  Diet ordered  Goal of Therapy: INR 2-3  Plan:  Warfarin 5 mg PO tonight at 18:00, would do 7.5 mg tomorrow if no bleeding x24 hrs postop  Daily INR  CBC at least q72 hr while on warfarin  Monitor for signs of bleeding or thrombosis   Reuel Boom, PharmD, BCPS [phone] 09/20/2019, 3:12 PM

## 2019-09-20 NOTE — Interval H&P Note (Signed)
History and Physical Interval Note:  09/20/2019 8:38 AM  Ann Long  has presented today for surgery, with the diagnosis of right hip osteoarthritis.  The various methods of treatment have been discussed with the patient and family. After consideration of risks, benefits and other options for treatment, the patient has consented to  Procedure(s) with comments: [place] (Right) - 140min as a surgical intervention.  The patient's history has been reviewed, patient examined, no change in status, stable for surgery.  I have reviewed the patient's chart and labs.  Questions were answered to the patient's satisfaction.     Pilar Plate Skyleigh Windle

## 2019-09-20 NOTE — Evaluation (Signed)
Physical Therapy Evaluation Patient Details Name: Ann Long MRN: [id number] DOB: 08/08/1951 Today's Date: 09/20/2019   History of Present Illness  Patient is 68 y.o. female s/p Rt THA ant approach on 09/20/19 with PMH significant for OA, HTN, osteoporosis, history of DVT, and Lt THA in 2009.    Clinical Impression  Ann Long is a 68 y.o. female POD 0 s/p Rt THA ant approach. Patient reports independence with mobility at baseline. Patient is now limited by functional impairments (see PT problem list below) and requires min assist for transfers and gait with RW. Patient was able to ambulate ~60 feet with RW and min cues for safe use and assist o steady. Patient instructed in exercise to facilitate ROM and circulation to manage edema. Patient will benefit from continued skilled PT interventions to address impairments and progress towards PLOF. Acute PT will follow to progress mobility and stair training in preparation for safe discharge home.      Follow Up Recommendations Follow surgeon's recommendation for DC plan and follow-up therapies    Equipment Recommendations  None recommended by PT    Recommendations for Other Services       Precautions / Restrictions Precautions Precautions: Fall Restrictions Weight Bearing Restrictions: No      Mobility  Bed Mobility Overal bed mobility: Needs Assistance Bed Mobility: Supine to Sit     Supine to sit: HOB elevated;Min assist     General bed mobility comments: assist for Rt LE mobility, pt using bed rails and utilizing elevated HOB, cues required for patient to sequence and push up to sit upright at EOB  Transfers Overall transfer level: Needs assistance Equipment used: Rolling walker (2 wheeled) Transfers: Sit to/from Stand Sit to Stand: Min assist         General transfer comment: verbal cues for safe hand placement and technique, assist required to initiate power up  Ambulation/Gait Ambulation/Gait assistance:  Min guard Gait Distance (Feet): 60 Feet Assistive device: Rolling walker (2 wheeled) Gait Pattern/deviations: Step-to pattern;Decreased step length - right;Decreased step length - left;Decreased stride length;Decreased weight shift to right;Antalgic Gait velocity: decreased   General Gait Details: pt reliant on UE's to unweight Rt LE during stance and swing phase, pt with difficulty advancing Rt LE initially with improved step legnth gradually while mobilizing, cues for hand placement on RW at start of session and maintained safe proximity throughout  [employer] Mobility    Modified Rankin (Stroke Patients Only)       Balance Overall balance assessment: Needs assistance Sitting-balance support: No upper extremity supported;Feet supported Sitting balance-Leahy Scale: Good     Standing balance support: During functional activity;Bilateral upper extremity supported Standing balance-Leahy Scale: Fair            Pertinent Vitals/Pain Pain Assessment: 0-10 Pain Score: 4  Pain Location: Rt hip and knee Pain Descriptors / Indicators: Aching;Sore Pain Intervention(s): Limited activity within patient's tolerance;Monitored during session;RN gave pain meds during session;Ice applied;Repositioned(RN gave meds at EOS)    Home Living Family/patient expects to be discharged to:: Private residence Living Arrangements: Spouse/significant other Available Help at Discharge: Family(pt's husband is home 24/7, her daughter lives 5 minutes away and is Charity fundraiser, she is off this week and next week) Type of Home: House Home Access: Stairs to enter Entrance Stairs-Rails: Left Entrance Stairs-Number of Steps: 5 step with Lt hand rail; also has 3 steps in garage wtih no rail (tall steps) Home  Layout: One level Home Equipment: Walker - 2 wheels;Cane - single point;Bedside commode      Prior Function Level of Independence: Independent               Hand Dominance         Extremity/Trunk Assessment   Upper Extremity Assessment Upper Extremity Assessment: Overall WFL for tasks assessed    Lower Extremity Assessment Lower Extremity Assessment: Generalized weakness;RLE deficits/detail RLE Deficits / Details: pt with decreased quad activaiton in supine, no buckling present in WB, sensation with light touch appears intact       Communication   Communication: No difficulties  Cognition Arousal/Alertness: Awake/alert Behavior During Therapy: WFL for tasks assessed/performed Overall Cognitive Status: Within Functional Limits for tasks assessed               General Comments      Exercises Total Joint Exercises Ankle Circles/Pumps: 10 reps;Seated;AROM;Both Quad Sets: AROM;5 reps;Supine;Right   Assessment/Plan    PT Assessment Patient needs continued PT services  PT Problem List Decreased strength;Decreased balance;Decreased knowledge of use of DME;Decreased mobility;Decreased range of motion;Decreased activity tolerance       PT Treatment Interventions DME instruction;Functional mobility training;Balance training;Patient/family education;Gait training;Therapeutic activities;Therapeutic exercise;Stair training;Modalities    PT Goals (Current goals can be found in the Care Plan section)  Acute Rehab PT Goals Patient Stated Goal: to go home PT Goal Formulation: With patient Time For Goal Achievement: 09/27/19 Potential to Achieve Goals: Good    Frequency 7X/week    AM-PAC PT "6 Clicks" Mobility  Outcome Measure Help needed turning from your back to your side while in a flat bed without using bedrails?: A Little Help needed moving from lying on your back to sitting on the side of a flat bed without using bedrails?: A Little Help needed moving to and from a bed to a chair (including a wheelchair)?: A Little Help needed standing up from a chair using your arms (e.g., wheelchair or bedside chair)?: A Little Help needed to walk in hospital  room?: A Little Help needed climbing 3-5 steps with a railing? : A Lot 6 Click Score: 17    End of Session Equipment Utilized During Treatment: Gait belt Activity Tolerance: Patient tolerated treatment well Patient left: in chair;with call bell/phone within reach;with chair alarm set Nurse Communication: Mobility status PT Visit Diagnosis: Other abnormalities of gait and mobility (R26.89);Muscle weakness (generalized) (M62.81);Unsteadiness on feet (R26.81);Difficulty in walking, not elsewhere classified (R26.2)    Time: 3664-4034 PT Time Calculation (min) (ACUTE ONLY): 30 min   Charges:   PT Evaluation $PT Eval Low Complexity: 1 Low PT Treatments $Gait Training: 8-22 mins        Kipp Brood, PT, DPT, [facility] Physical Therapist with [facility]  09/20/2019 5:20 PM

## 2019-09-20 NOTE — Anesthesia Preprocedure Evaluation (Signed)
Anesthesia Evaluation  Patient identified by MRN, date of birth, ID band Patient awake    Reviewed: Allergy & Precautions, NPO status , Patient's Chart, lab work & pertinent test results  History of Anesthesia Complications (+) PONV  Airway Mallampati: II  TM Distance: >3 FB Neck ROM: Full    Dental no notable dental hx.    Pulmonary PE   Pulmonary exam normal breath sounds clear to auscultation       Cardiovascular hypertension, Pt. on medications + DVT  Normal cardiovascular exam Rhythm:Regular Rate:Normal     Neuro/Psych negative neurological ROS  negative psych ROS   GI/Hepatic negative GI ROS, Neg liver ROS,   Endo/Other  negative endocrine ROS  Renal/GU negative Renal ROS  negative genitourinary   Musculoskeletal  (+) Arthritis , Osteoarthritis,    Abdominal (+) + obese,   Peds negative pediatric ROS (+)  Hematology negative hematology ROS (+)   Anesthesia Other Findings   Reproductive/Obstetrics negative OB ROS                             Anesthesia Physical Anesthesia Plan  ASA: III  Anesthesia Plan: Spinal   Post-op Pain Management:    Induction: Intravenous  PONV Risk Score and Plan: 3 and Ondansetron, Dexamethasone, Midazolam and Treatment may vary due to age or medical condition  Airway Management Planned: Simple Face Mask  Additional Equipment:   Intra-op Plan:   Post-operative Plan:   Informed Consent: I have reviewed the patients History and Physical, chart, labs and discussed the procedure including the risks, benefits and alternatives for the proposed anesthesia with the patient or authorized representative who has indicated his/her understanding and acceptance.     Dental advisory given  Plan Discussed with: CRNA  Anesthesia Plan Comments:         Anesthesia Quick Evaluation

## 2019-09-21 ENCOUNTER — Encounter (HOSPITAL_COMMUNITY): Payer: Self-pay | Admitting: Orthopedic Surgery

## 2019-09-21 LAB — PROTIME-INR
INR: 1 (ref 0.8–1.2)
Prothrombin Time: 12.8 seconds (ref 11.4–15.2)

## 2019-09-21 LAB — BASIC METABOLIC PANEL
Anion gap: 9 (ref 5–15)
BUN: 17 mg/dL (ref 8–23)
CO2: 23 mmol/L (ref 22–32)
Calcium: 8.7 mg/dL — ABNORMAL LOW (ref 8.9–10.3)
Chloride: 107 mmol/L (ref 98–111)
Creatinine, Ser: 0.66 mg/dL (ref 0.44–1.00)
GFR calc Af Amer: >60 mL/min (ref >60)
GFR calc non Af Amer: >60 mL/min (ref >60)
Glucose, Bld: 121 mg/dL — ABNORMAL HIGH (ref 70–99)
Potassium: 4.4 mmol/L (ref 3.5–5.1)
Sodium: 139 mmol/L (ref 135–145)

## 2019-09-21 LAB — CBC
HCT: 35.3 % — ABNORMAL LOW (ref 36.0–46.0)
Hemoglobin: 11.4 g/dL — ABNORMAL LOW (ref 12.0–15.0)
MCH: 31.2 pg (ref 26.0–34.0)
MCHC: 32.3 g/dL (ref 30.0–36.0)
MCV: 96.7 fL (ref 80.0–100.0)
Platelets: 263 10*3/uL (ref 150–400)
RBC: 3.65 MIL/uL — ABNORMAL LOW (ref 3.87–5.11)
RDW: 13.2 % (ref 11.5–15.5)
WBC: 10.5 10*3/uL (ref 4.0–10.5)
nRBC: 0 % (ref 0.0–0.2)

## 2019-09-21 MED ORDER — METHOCARBAMOL 500 MG PO TABS: 500.0000 mg | ORAL_TABLET | Freq: Four times a day (QID) | ORAL | 0 refills | Status: DC | PRN

## 2019-09-21 MED ORDER — ENOXAPARIN SODIUM 40 MG/0.4ML ~~LOC~~ SOLN: 40.0000 mg | Freq: Two times a day (BID) | SUBCUTANEOUS | Status: DC

## 2019-09-21 MED ORDER — HYDROCODONE-ACETAMINOPHEN 5-325 MG PO TABS: 1.0000 | ORAL_TABLET | Freq: Four times a day (QID) | ORAL | 0 refills | Status: DC | PRN

## 2019-09-21 MED ORDER — WARFARIN SODIUM 5 MG PO TABS: 7.5000 mg | ORAL_TABLET | Freq: Once | ORAL | Status: DC

## 2019-09-21 MED ORDER — TRAMADOL HCL 50 MG PO TABS: 50.0000 mg | ORAL_TABLET | Freq: Four times a day (QID) | ORAL | 0 refills | Status: DC | PRN

## 2019-09-21 NOTE — Progress Notes (Signed)
Physical Therapy Treatment Patient Details Name: Ann Long MRN: [id number] DOB: 09/17/1951 Today's Date: 09/21/2019    History of Present Illness Patient is 68 y.o. female s/p Rt THA ant approach on 09/20/19 with PMH significant for OA, HTN, osteoporosis, history of DVT, and Lt THA in 2009.    PT Comments    Pt progressing steadily with mobility but with increased time required and report of increased pain this pm.  Pt reviewed car transfers, stairs and home therex program with written instruction and progression provided.   Follow Up Recommendations  Follow surgeon's recommendation for DC plan and follow-up therapies     Equipment Recommendations  None recommended by PT    Recommendations for Other Services       Precautions / Restrictions Precautions Precautions: Fall Restrictions Weight Bearing Restrictions: No    Mobility  Bed Mobility               General bed mobility comments: Pt up on [facility] and requests recliner at end of session.  Pt declines to atempt in/out bed  Transfers Overall transfer level: Needs assistance Equipment used: Rolling walker (2 wheeled) Transfers: Sit to/from Stand Sit to Stand: Min guard;Supervision         General transfer comment: cues for LE management and use of UEs to self assist  Ambulation/Gait Ambulation/Gait assistance: Min guard;Supervision Gait Distance (Feet): 160 Feet Assistive device: Rolling walker (2 wheeled) Gait Pattern/deviations: Step-to pattern;Decreased step length - right;Decreased step length - left;Decreased stride length;Decreased weight shift to right;Antalgic Gait velocity: decreased   General Gait Details: cues for sequence, posture and position from RW.   Stairs Stairs: Yes Stairs assistance: Min assist Stair Management: One rail Left;Step to pattern;Forwards;With cane;No rails;Backwards;With walker Number of Stairs: 5 General stair comments: single step bkwd for stool into high bed.  4  steps fwd with cane and rail; cues for sequence and foot/cane placement   Wheelchair Mobility    Modified Rankin (Stroke Patients Only)       Balance Overall balance assessment: Needs assistance Sitting-balance support: No upper extremity supported;Feet supported Sitting balance-Leahy Scale: Good     Standing balance support: During functional activity;Bilateral upper extremity supported Standing balance-Leahy Scale: Fair                              Cognition Arousal/Alertness: Awake/alert Behavior During Therapy: WFL for tasks assessed/performed Overall Cognitive Status: Within Functional Limits for tasks assessed                                        Exercises Total Joint Exercises Ankle Circles/Pumps: Seated;AROM;Both;20 reps Quad Sets: AROM;Supine;Right;5 reps Heel Slides: AAROM;Right;Supine;5 reps Hip ABduction/ADduction: AAROM;Right;Supine;5 reps Long Arc Quad: AAROM;AROM;Right;5 reps;Seated    General Comments        Pertinent Vitals/Pain Pain Assessment: 0-10 Pain Score: 6  Pain Location: Rt hip and knee Pain Descriptors / Indicators: Aching;Sore Pain Intervention(s): Limited activity within patient's tolerance;Monitored during session;Premedicated before session;Ice applied    Home Living                      Prior Function            PT Goals (current goals can now be found in the care plan section) Acute Rehab PT Goals Patient Stated Goal: to go home PT Goal  Formulation: With patient Time For Goal Achievement: 09/27/19 Potential to Achieve Goals: Good Progress towards PT goals: Progressing toward goals    Frequency    7X/week      PT Plan Current plan remains appropriate    Co-evaluation              AM-PAC PT "6 Clicks" Mobility   Outcome Measure  Help needed turning from your back to your side while in a flat bed without using bedrails?: A Little Help needed moving from lying on your  back to sitting on the side of a flat bed without using bedrails?: A Little Help needed moving to and from a bed to a chair (including a wheelchair)?: A Little Help needed standing up from a chair using your arms (e.g., wheelchair or bedside chair)?: A Little Help needed to walk in hospital room?: A Little Help needed climbing 3-5 steps with a railing? : A Little 6 Click Score: 18    End of Session Equipment Utilized During Treatment: Gait belt Activity Tolerance: Patient tolerated treatment well;Patient limited by fatigue;Patient limited by pain Patient left: in chair;with call bell/phone within reach;with chair alarm set Nurse Communication: Mobility status PT Visit Diagnosis: Other abnormalities of gait and mobility (R26.89);Muscle weakness (generalized) (M62.81);Unsteadiness on feet (R26.81);Difficulty in walking, not elsewhere classified (R26.2)     Time: 3557-3220 PT Time Calculation (min) (ACUTE ONLY): 47 min  Charges:  $Gait Training: 8-22 mins $Therapeutic Exercise: 8-22 mins $Therapeutic Activity: 8-22 mins                     Debe Coder PT Acute Rehabilitation Services Pager [phone] Office [phone]    Ann Long 09/21/2019, 2:20 PM

## 2019-09-21 NOTE — Progress Notes (Signed)
[place] for warfarin Indication: DVT hx  Allergies  Allergen Reactions  . Doxycycline Diarrhea  . Sulfa Antibiotics Rash    Patient Measurements: Height: 5\' 7"  (170.2 cm) Weight: 242 lb 15.2 oz (110.2 kg) IBW/kg (Calculated) : 61.6  Vital Signs: Temp: 97.9 F (36.6 C) (10/01 0427) BP: 128/84 (10/01 0427) Pulse Rate: 56 (10/01 0427)  Labs: Recent Labs    09/20/19 0904 09/21/19 0235  HGB  --  11.4*  HCT  --  35.3*  PLT  --  263  LABPROT 12.5 12.8  INR 0.9 1.0  CREATININE  --  0.66    Estimated Creatinine Clearance: 87.3 mL/min (by C-G formula based on SCr of 0.66 mg/dL).   Medical History: Past Medical History:  Diagnosis Date  . Abnormal vaginal Pap smear   . Arthritis   . 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 Gallbladder disease   . Heart murmur   . Hypertension   . Jaundice    as a child, her sister was diagnosed with it due to ingestion hepatitis, she later develped januce and thpugh was not testes herself , was presumed to also have the same time of hepatitis and was subsequesntly treated with globulin '  . Left bundle branch block    "my pcp told me years ago i had it , 2 -3 years ago my obgyn heard a heart murmur and sent me to [cardiology] where they did a ultrasound of my heart , they didnt find anything definitive" , denies chest pain , sob, syncope   . Osteoarthritis   . Osteoporosis   . Phlebitis   . PONV (postoperative nausea and vomiting)     Medications:  Medications Prior to Admission  Medication Sig Dispense Refill Last Dose  . azelastine (ASTELIN) 0.1 % nasal spray Place 1 spray into both nostrils 2 (two) times daily as needed for rhinitis or allergies. Use in each nostril as directed     . cetirizine (ZYRTEC) 10 MG tablet Take 10 mg by mouth daily.   Past Week  at Unknown time  . cholecalciferol (VITAMIN D) 1000 units tablet Take 1,000 Units by mouth daily.   Past Week at Unknown time  . docusate sodium (COLACE) 100 MG capsule Take 100 mg by mouth daily.   Past Week at Unknown time  . enoxaparin (LOVENOX) 40 MG/0.4ML injection Inject 40 mg into the skin daily.   09/17/2019 at 1800  . fluticasone (FLONASE) 50 MCG/ACT nasal spray Place 1 spray into both nostrils daily as needed for allergies or rhinitis.   Past Week at Unknown time  . guaiFENesin (MUCINEX) 600 MG 12 hr tablet Take 600 mg by mouth 2 (two) times daily as needed for cough.   Past Week at Unknown time  . hydrochlorothiazide (HYDRODIURIL) 25 MG tablet Take 25 mg by mouth daily.   09/19/2019 at Unknown time  . hyoscyamine (ANASPAZ) 0.125 MG TBDP disintergrating tablet Place 0.125 mg under the tongue every 6 (six) hours as needed for cramping.   Past Week at Unknown time  . lisinopril (PRINIVIL,ZESTRIL) 5 MG tablet Take 5 mg by mouth daily.   09/19/2019 at Unknown time  . Multiple Vitamins-Minerals (AIRBORNE GUMMIES PO) Take 2 each by mouth daily.   Past Week at Unknown time  . pantoprazole (  PROTONIX) 40 MG tablet Take 40 mg by mouth daily as needed (acid reflux).    09/19/2019 at Unknown time  . psyllium (METAMUCIL) 58.6 % powder Take 1 packet by mouth daily.   Past Week at Unknown time  . amoxicillin (AMOXIL) 500 MG capsule Take 500 mg by mouth 2 (two) times daily. FOR DENTIST     . Probiotic Product (PROBIOTIC DAILY PO) Take 1 tablet by mouth daily.     Marland Kitchen warfarin (COUMADIN) 5 MG tablet Take 5 mg by mouth daily.    09/12/2019 at 1800   Scheduled:  . acetaminophen  500 mg Oral Q6H  . dexamethasone (DECADRON) injection  10 mg Intravenous Once  . docusate sodium  100 mg Oral BID  . enoxaparin  40 mg Subcutaneous Q24H  . hydrochlorothiazide  25 mg Oral Daily  . loratadine  10 mg Oral Daily  . Warfarin - Pharmacist Dosing Inpatient   Does not apply q1800    Assessment: 68 yoF with PMH DVT on  warfarin, now s/p R THA. Pharmacy consulted to resume warfarin post-op   Baseline INR normal after holding warfarin > 7d  Prior anticoagulation: warfarin 5 mg daily; LD 9/22  Significant events:  Today, 09/21/2019:  Hgb 11.4, Plts WNL  INR non-therapeutic (1.0)  Major drug interactions: none noted  No bleeding issues noted  Diet ordered  Goal of Therapy: INR 2-3  Plan:  Warfarin 7.5 mg po x 1 at 1800  Daily INR  CBC at least q72 hr while on warfarin  Monitor for signs of bleeding or thrombosis   Arley Phenix RPh 09/21/2019, 8:13 AM Pager [phone]

## 2019-09-21 NOTE — Progress Notes (Signed)
Therapy Plan: HEP Has DME-RW and 3 IN 1

## 2019-09-21 NOTE — Plan of Care (Signed)
  Problem: Education: Goal: Knowledge of the prescribed therapeutic regimen will improve 09/21/2019 1529 by Hubert Azure, RN Outcome: Progressing 09/21/2019 1426 by Hubert Azure, RN Outcome: Adequate for Discharge Goal: Understanding of discharge needs will improve 09/21/2019 1529 by Hubert Azure, RN Outcome: Progressing 09/21/2019 1426 by Hubert Azure, RN Outcome: Adequate for Discharge Goal: Individualized Educational Video(s) 09/21/2019 1529 by Hubert Azure, RN Outcome: Progressing 09/21/2019 1426 by Hubert Azure, RN Outcome: Adequate for Discharge   Problem: Activity: Goal: Ability to avoid complications of mobility impairment will improve Description: yes 09/21/2019 1529 by Hubert Azure, RN Outcome: Progressing 09/21/2019 1426 by Hubert Azure, RN Outcome: Adequate for Discharge

## 2019-09-21 NOTE — Progress Notes (Signed)
   Subjective: 1 Day Post-Op Procedure(s) (LRB): TOTAL HIP ARTHROPLASTY ANTERIOR APPROACH (Right) Patient reports pain as mild.   Plan is to go Home after hospital stay.  Objective: Vital signs in last 24 hours: Temp:  [97.5 F (36.4 C)-98.2 F (36.8 C)] 97.9 F (36.6 C) (10/01 0427) Pulse Rate:  [53-81] 56 (10/01 0427) Resp:  [11-20] 16 (10/01 0427) BP: (112-141)/(65-84) 128/84 (10/01 0427) SpO2:  [97 %-100 %] 100 % (10/01 0427) Weight:  [110.2 kg] 110.2 kg (09/30 1449)  Intake/Output from previous day:  Intake/Output Summary (Last 24 hours) at 09/21/2019 0709 Last data filed at 09/21/2019 0607 Gross per 24 hour  Intake 3446.39 ml  Output 3730 ml  Net -283.61 ml    Intake/Output this shift: No intake/output data recorded.  Labs: Recent Labs    09/21/19 0235  HGB 11.4*   Recent Labs    09/21/19 0235  WBC 10.5  RBC 3.65*  HCT 35.3*  PLT 263   Recent Labs    09/21/19 0235  NA 139  K 4.4  CL 107  CO2 23  BUN 17  CREATININE 0.66  GLUCOSE 121*  CALCIUM 8.7*   Recent Labs    09/20/19 0904 09/21/19 0235  INR 0.9 1.0    EXAM General - Patient is Alert, Appropriate and Oriented Extremity - Neurologically intact Neurovascular intact No cellulitis present Compartment soft Dressing - dressing C/D/I Motor Function - intact, moving foot and toes well on exam.  Hemovac pulled without difficulty.  Past Medical History:  Diagnosis Date  . Abnormal vaginal Pap smear   . Arthritis   . 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 Gallbladder disease   . Heart murmur   . Hypertension   . Jaundice    as a child, her sister was diagnosed with it due to ingestion hepatitis, she later develped januce and thpugh was not testes herself , was presumed to also have the same time of hepatitis and was subsequesntly treated with  globulin '  . Left bundle branch block    "my pcp told me years ago i had it , 2 -3 years ago my obgyn heard a heart murmur and sent me to [cardiology] where they did a ultrasound of my heart , they didnt find anything definitive" , denies chest pain , sob, syncope   . Osteoarthritis   . Osteoporosis   . Phlebitis   . PONV (postoperative nausea and vomiting)     Assessment/Plan: 1 Day Post-Op Procedure(s) (LRB): TOTAL HIP ARTHROPLASTY ANTERIOR APPROACH (Right) Principal Problem:   OA (osteoarthritis) of hip   Advance diet Up with therapy D/C IV fluids  Discharge home  DVT Prophylaxis - Lovenox and Coumadin Weight Bearing As Tolerated right Leg Hemovac Pulled  Ann Long

## 2019-09-21 NOTE — Progress Notes (Signed)
Physical Therapy Treatment Patient Details Name: Ann Long MRN: [id number] DOB: 07/05/1951 Today's Date: 09/21/2019    History of Present Illness Patient is 68 y.o. female s/p Rt THA ant approach on 09/20/19 with PMH significant for OA, HTN, osteoporosis, history of DVT, and Lt THA in 2009.    PT Comments    Pt cooperative and progressing with mobility.  Pt hopeful for dc home this date.   Follow Up Recommendations  Follow surgeon's recommendation for DC plan and follow-up therapies     Equipment Recommendations  None recommended by PT    Recommendations for Other Services       Precautions / Restrictions Precautions Precautions: Fall Restrictions Weight Bearing Restrictions: No    Mobility  Bed Mobility               General bed mobility comments: Pt up in chair with nursing and requests back to same  Transfers Overall transfer level: Needs assistance Equipment used: Rolling walker (2 wheeled) Transfers: Sit to/from Stand Sit to Stand: Min assist;Min guard         General transfer comment: cues for LE management and use of UEs to self assist  Ambulation/Gait Ambulation/Gait assistance: Min guard Gait Distance (Feet): 130 Feet Assistive device: Rolling walker (2 wheeled) Gait Pattern/deviations: Step-to pattern;Decreased step length - right;Decreased step length - left;Decreased stride length;Decreased weight shift to right;Antalgic Gait velocity: decreased   General Gait Details: cues for sequence, posture and position from RW.   Stairs             Wheelchair Mobility    Modified Rankin (Stroke Patients Only)       Balance Overall balance assessment: Needs assistance Sitting-balance support: No upper extremity supported;Feet supported Sitting balance-Leahy Scale: Good     Standing balance support: During functional activity;Bilateral upper extremity supported Standing balance-Leahy Scale: Fair                               Cognition Arousal/Alertness: Awake/alert Behavior During Therapy: WFL for tasks assessed/performed Overall Cognitive Status: Within Functional Limits for tasks assessed                                        Exercises Total Joint Exercises Ankle Circles/Pumps: Seated;AROM;Both;20 reps Quad Sets: AROM;Supine;Right;10 reps Heel Slides: AAROM;Right;20 reps;Supine Hip ABduction/ADduction: AAROM;Right;15 reps;Supine    General Comments        Pertinent Vitals/Pain Pain Assessment: 0-10 Pain Score: 4  Pain Location: Rt hip and knee Pain Descriptors / Indicators: Aching;Sore Pain Intervention(s): Limited activity within patient's tolerance;Monitored during session;Premedicated before session;Ice applied    Home Living                      Prior Function            PT Goals (current goals can now be found in the care plan section) Acute Rehab PT Goals Patient Stated Goal: to go home PT Goal Formulation: With patient Time For Goal Achievement: 09/27/19 Potential to Achieve Goals: Good Progress towards PT goals: Progressing toward goals    Frequency    7X/week      PT Plan Current plan remains appropriate    Co-evaluation              AM-PAC PT "6 Clicks" Mobility   Outcome Measure  Help needed turning from your back to your side while in a flat bed without using bedrails?: A Little Help needed moving from lying on your back to sitting on the side of a flat bed without using bedrails?: A Little Help needed moving to and from a bed to a chair (including a wheelchair)?: A Little Help needed standing up from a chair using your arms (e.g., wheelchair or bedside chair)?: A Little Help needed to walk in hospital room?: A Little Help needed climbing 3-5 steps with a railing? : A Lot 6 Click Score: 17    End of Session Equipment Utilized During Treatment: Gait belt Activity Tolerance: Patient tolerated treatment well Patient left:  in chair;with call bell/phone within reach;with chair alarm set Nurse Communication: Mobility status PT Visit Diagnosis: Other abnormalities of gait and mobility (R26.89);Muscle weakness (generalized) (M62.81);Unsteadiness on feet (R26.81);Difficulty in walking, not elsewhere classified (R26.2)     Time: 0935-1010 PT Time Calculation (min) (ACUTE ONLY): 35 min  Charges:  $Gait Training: 8-22 mins $Therapeutic Exercise: 8-22 mins                     [place] Pager [phone] Office [phone]    Nazaire Cordial 09/21/2019, 2:15 PM

## 2019-09-21 NOTE — Plan of Care (Signed)
  Problem: Education: Goal: Knowledge of the prescribed therapeutic regimen will improve Outcome: Adequate for Discharge Goal: Understanding of discharge needs will improve Outcome: Adequate for Discharge Goal: Individualized Educational Video(s) Outcome: Adequate for Discharge   Problem: Activity: Goal: Ability to avoid complications of mobility impairment will improve Description: yes Outcome: Adequate for Discharge Goal: Ability to tolerate increased activity will improve Outcome: Adequate for Discharge   Problem: Clinical Measurements: Goal: Postoperative complications will be avoided or minimized Description: yes Outcome: Adequate for Discharge   Problem: Pain Management: Goal: Pain level will decrease with appropriate interventions Description: yes Outcome: Adequate for Discharge   Problem: Skin Integrity: Goal: Will show signs of wound healing Outcome: Adequate for Discharge

## 2019-09-27 NOTE — Discharge Summary (Signed)
Physician Discharge Summary   Patient ID: Ann Long MRN: [id number] DOB/AGE: 1951/11/09 68 y.o.  Admit date: 09/20/2019 Discharge date: 09/21/2019  Primary Diagnosis: Osteoarthritis of the right hip  Admission Diagnoses:  Past Medical History:  Diagnosis Date  . Abnormal vaginal Pap smear   . Arthritis   . 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 Gallbladder disease   . Heart murmur   . Hypertension   . Jaundice    as a child, her sister was diagnosed with it due to ingestion hepatitis, she later develped januce and thpugh was not testes herself , was presumed to also have the same time of hepatitis and was subsequesntly treated with globulin '  . Left bundle branch block    "my pcp told me years ago i had it , 2 -3 years ago my obgyn heard a heart murmur and sent me to [cardiology] where they did a ultrasound of my heart , they didnt find anything definitive" , denies chest pain , sob, syncope   . Osteoarthritis   . Osteoporosis   . Phlebitis   . PONV (postoperative nausea and vomiting)    Discharge Diagnoses:   Principal Problem:   OA (osteoarthritis) of hip  Estimated body mass index is 38.05 kg/m as calculated from the following:   Height as of this encounter: 5\' 7"  (1.702 m).   Weight as of this encounter: 110.2 kg.  Procedure:  Procedure(s) (LRB): TOTAL HIP ARTHROPLASTY ANTERIOR APPROACH (Right)   Consults: None  HPI: Ann Long is a 67 y.o. female who has advanced end-  stage arthritis of their Right  hip with progressively worsening pain and  dysfunction.The patient has failed nonoperative management and presents for  total hip arthroplasty.    Laboratory Data: Admission on 09/20/2019, Discharged on 09/21/2019  Component Date Value Ref Range Status  . Prothrombin Time 09/20/2019 12.5  11.4 - 15.2 seconds Final   . INR 09/20/2019 0.9  0.8 - 1.2 Final   Comment: (NOTE) INR goal varies based on device and disease states. Performed at [facility], [place] [address] [place], [place] [address]   . WBC 09/21/2019 10.5  4.0 - 10.5 K/uL Final  . RBC 09/21/2019 3.65* 3.87 - 5.11 MIL/uL Final  . Hemoglobin 09/21/2019 11.4* 12.0 - 15.0 g/dL Final  . HCT 09/21/2019 35.3* 36.0 - 46.0 % Final  . MCV 09/21/2019 96.7  80.0 - 100.0 fL Final  . MCH 09/21/2019 31.2  26.0 - 34.0 pg Final  . MCHC 09/21/2019 32.3  30.0 - 36.0 g/dL Final  . RDW 09/21/2019 13.2  11.5 - 15.5 % Final  . Platelets 09/21/2019 263  150 - 400 K/uL Final  . nRBC 09/21/2019 0.0  0.0 - 0.2 % Final   Performed at [facility], [place] [address], [place], [place] [address]  . Sodium 09/21/2019 139  135 - 145 mmol/L Final  . Potassium 09/21/2019 4.4  3.5 - 5.1 mmol/L Final  . Chloride 09/21/2019 107  98 - 111 mmol/L Final  . CO2 09/21/2019 23  22 - 32 mmol/L Final  . Glucose, Bld 09/21/2019 121* 70 - 99 mg/dL Final  . BUN 09/21/2019 17  8 - 23 mg/dL Final  . Creatinine, Ser 09/21/2019 0.66  0.44 - 1.00 mg/dL Final  .  Calcium 09/21/2019 8.7* 8.9 - 10.3 mg/dL Final  . GFR calc non Af Amer 09/21/2019 >60  >60 mL/min Final  . GFR calc Af Amer 09/21/2019 >60  >60 mL/min Final  . Anion gap 09/21/2019 9  5 - 15 Final   Performed at [facility], [address], [place], [place] [address]  . Prothrombin Time 09/21/2019 12.8  11.4 - 15.2 seconds Final  . INR 09/21/2019 1.0  0.8 - 1.2 Final   Comment: (NOTE) INR goal varies based on device and disease states. Performed at [facility], [address] [place], [place] [address]   Hospital Outpatient Visit on 09/16/2019  Component Date Value Ref Range Status  . SARS-CoV-2, NAA 09/16/2019 NOT DETECTED  NOT DETECTED Final   Comment: (NOTE) This nucleic acid amplification test was developed and its performance characteristics  determined by [employer]. Nucleic acid amplification tests include PCR and TMA. This test has not been FDA cleared or approved. This test has been authorized by FDA under an Emergency Use Authorization (EUA). This test is only authorized for the duration of time the declaration that circumstances exist justifying the authorization of the emergency use of in vitro diagnostic tests for detection of SARS-CoV-2 virus and/or diagnosis of COVID-19 infection under section 564(b)(1) of the Act, 21 U.S.C. 098JXB-1(Y) (1), unless the authorization is terminated or revoked sooner. When diagnostic testing is negative, the possibility of a false negative result should be considered in the context of a patient's recent exposures and the presence of clinical signs and symptoms consistent with COVID-19. An individual without symptoms of COVID- 19 and who is not shedding SARS-CoV-2 vi                          rus would expect to have a negative (not detected) result in this assay. Performed At: [facility] [address] [place], [place] [id number] Jolene Schimke MD YQ:[id number]   . Coronavirus Source 09/16/2019 NASOPHARYNGEAL   Final   Performed at [facility], [address], [place], [place] [address]  Hospital Outpatient Visit on 09/14/2019  Component Date Value Ref Range Status  . aPTT 09/14/2019 56* 24 - 36 seconds Final   Comment:        IF BASELINE aPTT IS ELEVATED, SUGGEST PATIENT RISK ASSESSMENT BE USED TO DETERMINE APPROPRIATE ANTICOAGULANT THERAPY. Performed at [facility], [address] [place], [place] [address]   . WBC 09/14/2019 4.9  4.0 - 10.5 K/uL Final  . RBC 09/14/2019 4.29  3.87 - 5.11 MIL/uL Final  . Hemoglobin 09/14/2019 13.2  12.0 - 15.0 g/dL Final  . HCT 10/17/2535 41.1  36.0 - 46.0 % Final  . MCV 09/14/2019 95.8  80.0 - 100.0 fL Final  . MCH 09/14/2019 30.8  26.0 - 34.0 pg Final  . MCHC 09/14/2019 32.1  30.0 - 36.0 g/dL  Final  . RDW 64/40/3474 13.0  11.5 - 15.5 % Final  . Platelets 09/14/2019 294  150 - 400 K/uL Final  . nRBC 09/14/2019 0.0  0.0 - 0.2 % Final  . Neutrophils Relative % 09/14/2019 51  % Final  . Neutro Abs 09/14/2019 2.5  1.7 - 7.7 K/uL Final  . Lymphocytes Relative 09/14/2019 41  % Final  . Lymphs Abs 09/14/2019 2.0  0.7 - 4.0 K/uL Final  . Monocytes Relative 09/14/2019 6  % Final  . Monocytes Absolute 09/14/2019 0.3  0.1 - 1.0 K/uL  Final  . Eosinophils Relative 09/14/2019 1  % Final  . Eosinophils Absolute 09/14/2019 0.1  0.0 - 0.5 K/uL Final  . Basophils Relative 09/14/2019 1  % Final  . Basophils Absolute 09/14/2019 0.0  0.0 - 0.1 K/uL Final  . Immature Granulocytes 09/14/2019 0  % Final  . Abs Immature Granulocytes 09/14/2019 0.01  0.00 - 0.07 K/uL Final   Performed at [facility], [address], [place], [place] [address]  . Sodium 09/14/2019 136  135 - 145 mmol/L Final  . Potassium 09/14/2019 4.2  3.5 - 5.1 mmol/L Final  . Chloride 09/14/2019 103  98 - 111 mmol/L Final  . CO2 09/14/2019 26  22 - 32 mmol/L Final  . Glucose, Bld 09/14/2019 94  70 - 99 mg/dL Final  . BUN 60/45/4098 22  8 - 23 mg/dL Final  . Creatinine, Ser 09/14/2019 0.84  0.44 - 1.00 mg/dL Final  . Calcium 11/91/4782 9.0  8.9 - 10.3 mg/dL Final  . Total Protein 09/14/2019 7.0  6.5 - 8.1 g/dL Final  . Albumin 95/62/1308 4.1  3.5 - 5.0 g/dL Final  . AST 65/78/4696 18  15 - 41 U/L Final  . ALT 09/14/2019 16  0 - 44 U/L Final  . Alkaline Phosphatase 09/14/2019 57  38 - 126 U/L Final  . Total Bilirubin 09/14/2019 0.8  0.3 - 1.2 mg/dL Final  . GFR calc non Af Amer 09/14/2019 >60  >60 mL/min Final  . GFR calc Af Amer 09/14/2019 >60  >60 mL/min Final  . Anion gap 09/14/2019 7  5 - 15 Final   Performed at [facility], [address] [place], [place] [address]  . Prothrombin Time 09/14/2019 22.8* 11.4 - 15.2 seconds Final  . INR 09/14/2019 2.0* 0.8 - 1.2 Final   Comment: (NOTE)  INR goal varies based on device and disease states. Performed at [facility], [address], [place], [place] [address]   . ABO/RH(D) 09/14/2019 B POS   Final  . Antibody Screen 09/14/2019 NEG   Final  . Sample Expiration 09/14/2019 09/23/2019,2359   Final  . Extend sample reason 09/14/2019    Final                   Value:NO TRANSFUSIONS OR PREGNANCY IN THE PAST 3 MONTHS Performed at [facility], [address], [place], [place] [address]   . MRSA, PCR 09/14/2019 NEGATIVE  NEGATIVE Final  . Staphylococcus aureus 09/14/2019 NEGATIVE  NEGATIVE Final   Comment: (NOTE) The Xpert SA Assay (FDA approved for NASAL specimens in patients 54 years of age and older), is one component of a comprehensive surveillance program. It is not intended to diagnose infection nor to guide or monitor treatment. Performed at [facility], [address] [place], [place] [address]      X-Rays:Dg Pelvis Portable  Result Date: 09/20/2019 CLINICAL DATA:  Status post total right hip arthroplasty. EXAM: PORTABLE PELVIS 1-2 VIEWS COMPARISON:  Earlier intraoperative film, same date. FINDINGS: Well seated femoral and acetabular components. No complicating features. The visualized bony pelvis is intact. Remote left hip prosthesis appears intact. IMPRESSION: Right total hip arthroplasty components in good position without complicating features. Electronically Signed   By: Rudie Meyer M.D.   On: 09/20/2019 13:30   Dg C-arm 1-60 Min-no Report  Addendum Date: 09/20/2019   ADDENDUM REPORT: 09/20/2019 12:27 ADDENDUM: These results were called by telephone at the time of interpretation on  09/20/2019 at 12:26 pm to provider Ollen Gross , who verbally acknowledged these results. Provider aware of these findings as an expected intraoperative finding. Electronically Signed   By: Donzetta Kohut M.D.   On: 09/20/2019 12:27   Result Date: 09/20/2019 CLINICAL DATA:   Arthroplasty. EXAM: OPERATIVE right HIP (WITH PELVIS IF PERFORMED) 2 VIEWS TECHNIQUE: Fluoroscopic spot image(s) were submitted for interpretation post-operatively. COMPARISON:  09/10/2028 FINDINGS: Intraoperative spot images of the bilateral hips and of the right hip show interval changes of right hip arthroplasty without complicating features. Marker for sponge over the soft tissues of the right hip where there is lucency suggesting open wound. IMPRESSION: Post right hip arthroplasty without complicating features. Lucency overlying soft tissues of the right hip with radiopaque marker suggesting packing material or lap sponge in this location. Call is out to the referring provider to ensure that this is a known finding. Electronically Signed: By: Donzetta Kohut M.D. On: 09/20/2019 12:13   Dg Hip Operative Unilat W Or W/o Pelvis Right  Addendum Date: 09/20/2019   ADDENDUM REPORT: 09/20/2019 12:27 ADDENDUM: These results were called by telephone at the time of interpretation on 09/20/2019 at 12:26 pm to provider Ollen Gross , who verbally acknowledged these results. Provider aware of these findings as an expected intraoperative finding. Electronically Signed   By: Donzetta Kohut M.D.   On: 09/20/2019 12:27   Result Date: 09/20/2019 CLINICAL DATA:  Arthroplasty. EXAM: OPERATIVE right HIP (WITH PELVIS IF PERFORMED) 2 VIEWS TECHNIQUE: Fluoroscopic spot image(s) were submitted for interpretation post-operatively. COMPARISON:  09/10/2028 FINDINGS: Intraoperative spot images of the bilateral hips and of the right hip show interval changes of right hip arthroplasty without complicating features. Marker for sponge over the soft tissues of the right hip where there is lucency suggesting open wound. IMPRESSION: Post right hip arthroplasty without complicating features. Lucency overlying soft tissues of the right hip with radiopaque marker suggesting packing material or lap sponge in this location. Call is out to the  referring provider to ensure that this is a known finding. Electronically Signed: By: Donzetta Kohut M.D. On: 09/20/2019 12:13    EKG: Orders placed or performed during the hospital encounter of 09/14/19  . EKG  . EKG     Hospital Course: MARIUM RAGAN is a 68 y.o. who was admitted to [facility]. They were brought to the operating room on 09/20/2019 and underwent Procedure(s): TOTAL HIP ARTHROPLASTY ANTERIOR APPROACH.  Patient tolerated the procedure well and was later transferred to the recovery room and then to the orthopaedic floor for postoperative care. They were given PO and IV analgesics for pain control following their surgery. They were given 24 hours of postoperative antibiotics of  Anti-infectives (From admission, onward)   Start     Dose/Rate Route Frequency Ordered Stop   09/20/19 1600  ceFAZolin (ANCEF) IVPB 2g/100 mL premix     2 g 200 mL/hr over 30 Minutes Intravenous Every 6 hours 09/20/19 1456 09/20/19 2158   09/20/19 0830  ceFAZolin (ANCEF) IVPB 2g/100 mL premix     2 g 200 mL/hr over 30 Minutes Intravenous On call to O.R. 09/20/19 1610 09/20/19 1058     and started on DVT prophylaxis in the form of Lovenox and Coumadin.   PT and OT were ordered for total joint protocol. Discharge planning consulted to help with postop disposition and equipment needs. Patient had a good night on the evening of surgery. They started to get up OOB with therapy on POD #  0. Hemovac drain was pulled without difficulty on day one. Continued to work with therapy into POD #1. Patient was seen in rounds and was ready to go home pending progress with therapy. She worked with therapy for two additional sessions and was discharged home that afternoon.  Diet: Regular diet Activity: WBAT Follow-up: in 2 weeks Disposition: Home Discharged Condition: good   Discharge Instructions    Call MD / Call 911   Complete by: As directed    If you experience chest pain or shortness of breath, CALL  911 and be transported to the hospital emergency room.  If you develope a fever above 101 F, pus (white drainage) or increased drainage or redness at the wound, or calf pain, call your surgeon's office.   Change dressing   Complete by: As directed    You may change your dressing on Friday, then change the dressing daily with sterile 4 x 4 inch gauze dressing and paper tape for one week.   Constipation Prevention   Complete by: As directed    Drink plenty of fluids.  Prune juice may be helpful.  You may use a stool softener, such as Colace (over the counter) 100 mg twice a day.  Use MiraLax (over the counter) for constipation as needed.   Diet - low sodium heart healthy   Complete by: As directed    Discharge instructions   Complete by: As directed    Dr. Ollen Gross Total Joint Specialist [facility] [address], [address] [place], [place] [address] [phone]  ANTERIOR APPROACH TOTAL HIP REPLACEMENT POSTOPERATIVE DIRECTIONS   Hip Rehabilitation, Guidelines Following Surgery  The results of a hip operation are greatly improved after range of motion and muscle strengthening exercises. Follow all safety measures which are given to protect your hip. If any of these exercises cause increased pain or swelling in your joint, decrease the amount until you are comfortable again. Then slowly increase the exercises. Call your caregiver if you have problems or questions.   HOME CARE INSTRUCTIONS  Remove items at home which could result in a fall. This includes throw rugs or furniture in walking pathways.  ICE to the affected hip every three hours for 30 minutes at a time and then as needed for pain and swelling.  Continue to use ice on the hip for pain and swelling from surgery. You may notice swelling that will progress down to the foot and ankle.  This is normal after surgery.  Elevate the leg when you are not up walking on it.   Continue to use the breathing machine which will help keep  your temperature down.  It is common for your temperature to cycle up and down following surgery, especially at night when you are not up moving around and exerting yourself.  The breathing machine keeps your lungs expanded and your temperature down.  DIET You may resume your previous home diet once your are discharged from the hospital.  DRESSING / WOUND CARE / SHOWERING You may change your dressing 3-5 days after surgery.  Then change the dressing every day with sterile gauze.  Please use good hand washing techniques before changing the dressing.  Do not use any lotions or creams on the incision until instructed by your surgeon. You may start showering once you are discharged home but do not submerge the incision under water. Just pat the incision dry and apply a dry gauze dressing on daily. Change the surgical dressing daily and reapply a  dry dressing each time.  ACTIVITY Walk with your walker as instructed. Use walker as long as suggested by your caregivers. Avoid periods of inactivity such as sitting longer than an hour when not asleep. This helps prevent blood clots.  You may resume a sexual relationship in one month or when given the OK by your doctor.  You may return to work once you are cleared by your doctor.  Do not drive a car for 6 weeks or until released by you surgeon.  Do not drive while taking narcotics.  WEIGHT BEARING Weight bearing as tolerated with assist device (walker, cane, etc) as directed, use it as long as suggested by your surgeon or therapist, typically at least 4-6 weeks.  POSTOPERATIVE CONSTIPATION PROTOCOL Constipation - defined medically as fewer than three stools per week and severe constipation as less than one stool per week.  One of the most common issues patients have following surgery is constipation.  Even if you have a regular bowel pattern at home, your normal regimen is likely to be disrupted due to multiple reasons following surgery.  Combination  of anesthesia, postoperative narcotics, change in appetite and fluid intake all can affect your bowels.  In order to avoid complications following surgery, here are some recommendations in order to help you during your recovery period.  Colace (docusate) - Pick up an over-the-counter form of Colace or another stool softener and take twice a day as long as you are requiring postoperative pain medications.  Take with a full glass of water daily.  If you experience loose stools or diarrhea, hold the colace until you stool forms back up.  If your symptoms do not get better within 1 week or if they get worse, check with your doctor.  Dulcolax (bisacodyl) - Pick up over-the-counter and take as directed by the product packaging as needed to assist with the movement of your bowels.  Take with a full glass of water.  Use this product as needed if not relieved by Colace only.   MiraLax (polyethylene glycol) - Pick up over-the-counter to have on hand.  MiraLax is a solution that will increase the amount of water in your bowels to assist with bowel movements.  Take as directed and can mix with a glass of water, juice, soda, coffee, or tea.  Take if you go more than two days without a movement. Do not use MiraLax more than once per day. Call your doctor if you are still constipated or irregular after using this medication for 7 days in a row.  If you continue to have problems with postoperative constipation, please contact the office for further assistance and recommendations.  If you experience "the worst abdominal pain ever" or develop nausea or vomiting, please contact the office immediatly for further recommendations for treatment.  ITCHING  If you experience itching with your medications, try taking only a single pain pill, or even half a pain pill at a time.  You can also use Benadryl over the counter for itching or also to help with sleep.   TED HOSE STOCKINGS Wear the elastic stockings on both legs for  three weeks following surgery during the day but you may remove then at night for sleeping.  MEDICATIONS See your medication summary on the "After Visit Summary" that the nursing staff will review with you prior to discharge.  You may have some home medications which will be placed on hold until you complete the course of blood thinner medication.  It is  important for you to complete the blood thinner medication as prescribed by your surgeon.  Continue your approved medications as instructed at time of discharge.  PRECAUTIONS If you experience chest pain or shortness of breath - call 911 immediately for transfer to the hospital emergency department.  If you develop a fever greater that 101 F, purulent drainage from wound, increased redness or drainage from wound, foul odor from the wound/dressing, or calf pain - CONTACT YOUR SURGEON.                                                   FOLLOW-UP APPOINTMENTS Make sure you keep all of your appointments after your operation with your surgeon and caregivers. You should call the office at the above phone number and make an appointment for approximately two weeks after the date of your surgery or on the date instructed by your surgeon outlined in the "After Visit Summary".  RANGE OF MOTION AND STRENGTHENING EXERCISES  These exercises are designed to help you keep full movement of your hip joint. Follow your caregiver's or physical therapist's instructions. Perform all exercises about fifteen times, three times per day or as directed. Exercise both hips, even if you have had only one joint replacement. These exercises can be done on a training (exercise) mat, on the floor, on a table or on a bed. Use whatever works the best and is most comfortable for you. Use music or television while you are exercising so that the exercises are a pleasant break in your day. This will make your life better with the exercises acting as a break in routine you can look forward to.   Lying on your back, slowly slide your foot toward your buttocks, raising your knee up off the floor. Then slowly slide your foot back down until your leg is straight again.  Lying on your back spread your legs as far apart as you can without causing discomfort.  Lying on your side, raise your upper leg and foot straight up from the floor as far as is comfortable. Slowly lower the leg and repeat.  Lying on your back, tighten up the muscle in the front of your thigh (quadriceps muscles). You can do this by keeping your leg straight and trying to raise your heel off the floor. This helps strengthen the largest muscle supporting your knee.  Lying on your back, tighten up the muscles of your buttocks both with the legs straight and with the knee bent at a comfortable angle while keeping your heel on the floor.   IF YOU ARE TRANSFERRED TO A SKILLED REHAB FACILITY If the patient is transferred to a skilled rehab facility following release from the hospital, a list of the current medications will be sent to the facility for the patient to continue.  When discharged from the skilled rehab facility, please have the facility set up the patient's Home Health Physical Therapy prior to being released. Also, the skilled facility will be responsible for providing the patient with their medications at time of release from the facility to include their pain medication, the muscle relaxants, and their blood thinner medication. If the patient is still at the rehab facility at time of the two week follow up appointment, the skilled rehab facility will also need to assist the patient in arranging follow up appointment in our  office and any transportation needs.  MAKE SURE YOU:  Understand these instructions.  Get help right away if you are not doing well or get worse.    Pick up stool softner and laxative for home use following surgery while on pain medications. Do not submerge incision under water. Please use good hand  washing techniques while changing dressing each day. May shower starting three days after surgery. Please use a clean towel to pat the incision dry following showers. Continue to use ice for pain and swelling after surgery. Do not use any lotions or creams on the incision until instructed by your surgeon.   Do not sit on low chairs, stoools or toilet seats, as it may be difficult to get up from low surfaces   Complete by: As directed    Driving restrictions   Complete by: As directed    No driving for two weeks   TED hose   Complete by: As directed    Use stockings (TED hose) for three weeks on both leg(s).  You may remove them at night for sleeping.   Weight bearing as tolerated   Complete by: As directed      Allergies as of 09/21/2019      Reactions   Doxycycline Diarrhea   Sulfa Antibiotics Rash      Medication List    TAKE these medications   AIRBORNE GUMMIES PO Take 2 each by mouth daily.   amoxicillin 500 MG capsule Commonly known as: AMOXIL Take 500 mg by mouth 2 (two) times daily. FOR DENTIST   azelastine 0.1 % nasal spray Commonly known as: ASTELIN Place 1 spray into both nostrils 2 (two) times daily as needed for rhinitis or allergies. Use in each nostril as directed   cetirizine 10 MG tablet Commonly known as: ZYRTEC Take 10 mg by mouth daily.   cholecalciferol 1000 units tablet Commonly known as: VITAMIN D Take 1,000 Units by mouth daily.   docusate sodium 100 MG capsule Commonly known as: COLACE Take 100 mg by mouth daily.   enoxaparin 40 MG/0.4ML injection Commonly known as: LOVENOX Inject 0.4 mLs (40 mg total) into the skin every 12 (twelve) hours for 5 days. What changed: when to take this   fluticasone 50 MCG/ACT nasal spray Commonly known as: FLONASE Place 1 spray into both nostrils daily as needed for allergies or rhinitis.   guaiFENesin 600 MG 12 hr tablet Commonly known as: MUCINEX Take 600 mg by mouth 2 (two) times daily as needed for  cough.   hydrochlorothiazide 25 MG tablet Commonly known as: HYDRODIURIL Take 25 mg by mouth daily.   HYDROcodone-acetaminophen 5-325 MG tablet Commonly known as: NORCO/VICODIN Take 1-2 tablets by mouth every 6 (six) hours as needed for severe pain.   hyoscyamine 0.125 MG Tbdp disintergrating tablet Commonly known as: ANASPAZ Place 0.125 mg under the tongue every 6 (six) hours as needed for cramping.   lisinopril 5 MG tablet Commonly known as: ZESTRIL Take 5 mg by mouth daily.   methocarbamol 500 MG tablet Commonly known as: ROBAXIN Take 1 tablet (500 mg total) by mouth every 6 (six) hours as needed for muscle spasms. Notes to patient: Muscle Relaxer   pantoprazole 40 MG tablet Commonly known as: PROTONIX Take 40 mg by mouth daily as needed (acid reflux).   PROBIOTIC DAILY PO Take 1 tablet by mouth daily.   psyllium 58.6 % powder Commonly known as: METAMUCIL Take 1 packet by mouth daily.   traMADol 50 MG tablet  Commonly known as: ULTRAM Take 1-2 tablets (50-100 mg total) by mouth every 6 (six) hours as needed for moderate pain.   warfarin 5 MG tablet Commonly known as: COUMADIN Take 5 mg by mouth daily.            Discharge Care Instructions  (From admission, onward)         Start     Ordered   09/21/19 0000  Weight bearing as tolerated     09/21/19 0737   09/21/19 0000  Change dressing    Comments: You may change your dressing on Friday, then change the dressing daily with sterile 4 x 4 inch gauze dressing and paper tape for one week.   09/21/19 [id number]         Follow-up Information    Ollen GrossAluisio, Frank, MD. Schedule an appointment as soon as possible for a visit on 10/03/2019.   Specialty: Orthopedic Surgery Contact information: [address] [place] [address] [phone]        Clovis RileyMitchell, L.August Saucerean, MD. Schedule an appointment as soon as possible for a visit in 1 week(s).   Specialty: Family Medicine Why: Check INR Contact  information: [address] [place] [address] [phone]           Signed: Dennie Bibleshley Cythia Bachtel, PA-C Orthopedic Surgery 09/27/2019, 8:36 AM

## 2019-10-04 ENCOUNTER — Other Ambulatory Visit: Payer: Self-pay

## 2019-10-04 ENCOUNTER — Encounter (HOSPITAL_BASED_OUTPATIENT_CLINIC_OR_DEPARTMENT_OTHER): Payer: Self-pay | Admitting: *Deleted

## 2019-10-04 ENCOUNTER — Emergency Department (HOSPITAL_BASED_OUTPATIENT_CLINIC_OR_DEPARTMENT_OTHER)
Admission: EM | Admit: 2019-10-04 | Discharge: 2019-10-05 | Disposition: A | Discharge status: Home / Self Care | Payer: Medicare Other | Attending: Emergency Medicine | Admitting: Emergency Medicine

## 2019-10-04 ENCOUNTER — Emergency Department (HOSPITAL_BASED_OUTPATIENT_CLINIC_OR_DEPARTMENT_OTHER): Payer: Medicare Other

## 2019-10-04 DIAGNOSIS — Z79899 Other long term (current) drug therapy: Secondary | ICD-10-CM | POA: Insufficient documentation

## 2019-10-04 DIAGNOSIS — Z7901 Long term (current) use of anticoagulants: Secondary | ICD-10-CM | POA: Diagnosis not present

## 2019-10-04 DIAGNOSIS — Z5181 Encounter for therapeutic drug level monitoring: Secondary | ICD-10-CM

## 2019-10-04 DIAGNOSIS — T8149XA Infection following a procedure, other surgical site, initial encounter: Secondary | ICD-10-CM

## 2019-10-04 DIAGNOSIS — Y798 Miscellaneous orthopedic devices associated with adverse incidents, not elsewhere classified: Secondary | ICD-10-CM | POA: Diagnosis not present

## 2019-10-04 DIAGNOSIS — R791 Abnormal coagulation profile: Secondary | ICD-10-CM | POA: Insufficient documentation

## 2019-10-04 DIAGNOSIS — I82412 Acute embolism and thrombosis of left femoral vein: Secondary | ICD-10-CM

## 2019-10-04 DIAGNOSIS — I10 Essential (primary) hypertension: Secondary | ICD-10-CM | POA: Insufficient documentation

## 2019-10-04 DIAGNOSIS — M79605 Pain in left leg: Secondary | ICD-10-CM | POA: Diagnosis present

## 2019-10-04 LAB — CBC WITH DIFFERENTIAL/PLATELET
Abs Immature Granulocytes: 0.04 10*3/uL (ref 0.00–0.07)
Basophils Absolute: 0 10*3/uL (ref 0.0–0.1)
Basophils Relative: 0 %
Eosinophils Absolute: 0.1 10*3/uL (ref 0.0–0.5)
Eosinophils Relative: 1 %
HCT: 33 % — ABNORMAL LOW (ref 36.0–46.0)
Hemoglobin: 10.8 g/dL — ABNORMAL LOW (ref 12.0–15.0)
Immature Granulocytes: 0 %
Lymphocytes Relative: 22 %
Lymphs Abs: 2.4 10*3/uL (ref 0.7–4.0)
MCH: 30.8 pg (ref 26.0–34.0)
MCHC: 32.7 g/dL (ref 30.0–36.0)
MCV: 94 fL (ref 80.0–100.0)
Monocytes Absolute: 1 10*3/uL (ref 0.1–1.0)
Monocytes Relative: 9 %
Neutro Abs: 7.7 10*3/uL (ref 1.7–7.7)
Neutrophils Relative %: 68 %
Platelets: 316 10*3/uL (ref 150–400)
RBC: 3.51 MIL/uL — ABNORMAL LOW (ref 3.87–5.11)
RDW: 12.6 % (ref 11.5–15.5)
WBC: 11.4 10*3/uL — ABNORMAL HIGH (ref 4.0–10.5)
nRBC: 0 % (ref 0.0–0.2)

## 2019-10-04 MED ORDER — ENOXAPARIN SODIUM 120 MG/0.8ML ~~LOC~~ SOLN
1.0000 mg/kg | Freq: Once | SUBCUTANEOUS | Status: AC
  Administered 2019-10-04: 110 mg via SUBCUTANEOUS
  Filled 2019-10-04: qty 0.8

## 2019-10-04 MED ORDER — LEVOFLOXACIN IN D5W 750 MG/150ML IV SOLN
750.0000 mg | Freq: Once | INTRAVENOUS | Status: AC
  Administered 2019-10-04: 750 mg via INTRAVENOUS
  Filled 2019-10-04: qty 150

## 2019-10-04 NOTE — ED Triage Notes (Signed)
C/o left calf and leg pain x 4 days, hip replacement 9/30

## 2019-10-04 NOTE — ED Notes (Signed)
Patient transported to Ultrasound 

## 2019-10-04 NOTE — ED Provider Notes (Addendum)
[facility] Provider Note: Ann Dell, MD, FACEP  CSN: [id number] MRN: [id number] ARRIVAL: 10/04/19 at 2111 ROOM: [facility]08/[facility]08   CHIEF COMPLAINT  Leg Pain   HISTORY OF PRESENT ILLNESS  10/04/19 10:54 PM Ann Long is a 68 y.o. female with a history of DVTs and right hip replacement on 09/20/2019.  She had been on Keflex for wound infection prophylaxis or wound infection, it is unclear if a wound infection had actually been diagnosed.  She discontinued the Keflex 3 days ago.  She is here with 3 days of pain in her left calf now radiating to her left popliteal fossa.  The pain is aching in nature and she radiates is a 5 out of 10, worse with ambulation.  There is no significant associated edema nor is there associated erythema.  She has developed new erythema around the right hip wound with drainage.  She had a fever of 101 earlier but took Tylenol to relieve this.  She denies chest pain, shortness of breath or dyspnea with exertion.  She is on Coumadin, 5 mg daily but has not had her INR checked "in a while".   Past Medical History:  Diagnosis Date  . Abnormal vaginal Pap smear   . Arthritis   . 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 Gallbladder disease   . Heart murmur   . Hypertension   . Jaundice    as a child, her sister was diagnosed with it due to ingestion hepatitis, she later develped januce and thpugh was not testes herself , was presumed to also have the same time of hepatitis and was subsequesntly treated with globulin '  . Left bundle branch block    "my pcp told me years ago i had it , 2 -3 years ago my obgyn heard a heart murmur and sent me to [cardiology] where they did a ultrasound of my heart , they didnt find anything definitive" , denies chest pain , sob, syncope   . Osteoarthritis   . Osteoporosis   .  Phlebitis   . PONV (postoperative nausea and vomiting)     Past Surgical History:  Procedure Laterality Date  . ABLATION  uterine  . CHOLECYSTECTOMY  over 10 years ago  . REPLACEMENT TOTAL HIP W/  RESURFACING IMPLANTS    . TOTAL HIP ARTHROPLASTY Left 2009   at [place]   . TOTAL HIP ARTHROPLASTY Right 09/20/2019   Procedure: TOTAL HIP ARTHROPLASTY ANTERIOR APPROACH;  Surgeon: Ollen Gross, MD;  Location: [facility];  Service: Orthopedics;  Laterality: Right;   . TUBAL LIGATION      Family History  Problem Relation Age of Onset  . Hypertension Mother   . Stroke Mother   . Heart attack Father   . Stroke Father   . Hypertension Sister   . Heart attack Sister        X2  . Atrial fibrillation Sister        X2  . Heart attack Paternal Grandfather   . Stroke Sister     Social History   Tobacco Use  . Smoking status: Never Smoker  . Smokeless tobacco: Never Used  Substance Use Topics  . Alcohol use: No  . Drug use: No    Prior to Admission medications   Medication Sig Start  Date End Date Taking? Authorizing Provider  amoxicillin (AMOXIL) 500 MG capsule Take 500 mg by mouth 2 (two) times daily. FOR DENTIST 11/17/15   [provider]  azelastine (ASTELIN) 0.1 % nasal spray Place 1 spray into both nostrils 2 (two) times daily as needed for rhinitis or allergies. Use in each nostril as directed    [provider]  cetirizine (ZYRTEC) 10 MG tablet Take 10 mg by mouth daily.    [provider]  cholecalciferol (VITAMIN D) 1000 units tablet Take 1,000 Units by mouth daily.    [provider]  docusate sodium (COLACE) 100 MG capsule Take 100 mg by mouth daily.    [provider]  enoxaparin (LOVENOX) 40 MG/0.4ML injection Inject 0.4 mLs (40 mg total) into the skin every 12 (twelve) hours for 5 days. 09/21/19 09/26/19  Tommie ArdStinson, Ashley R, PA-C  fluticasone (FLONASE) 50 MCG/ACT nasal spray Place 1 spray into both nostrils daily as needed  for allergies or rhinitis.    [provider]  guaiFENesin (MUCINEX) 600 MG 12 hr tablet Take 600 mg by mouth 2 (two) times daily as needed for cough.    [provider]  hydrochlorothiazide (HYDRODIURIL) 25 MG tablet Take 25 mg by mouth daily.    [provider]  HYDROcodone-acetaminophen (NORCO/VICODIN) 5-325 MG tablet Take 1-2 tablets by mouth every 6 (six) hours as needed for severe pain. 09/21/19   Tommie ArdStinson, Ashley R, PA-C  hyoscyamine (ANASPAZ) 0.125 MG TBDP disintergrating tablet Place 0.125 mg under the tongue every 6 (six) hours as needed for cramping.    [provider]  lisinopril (PRINIVIL,ZESTRIL) 5 MG tablet Take 5 mg by mouth daily.    [provider]  methocarbamol (ROBAXIN) 500 MG tablet Take 1 tablet (500 mg total) by mouth every 6 (six) hours as needed for muscle spasms. 09/21/19   Tommie ArdStinson, Ashley R, PA-C  Multiple Vitamins-Minerals (AIRBORNE GUMMIES PO) Take 2 each by mouth daily.    [provider]  pantoprazole (PROTONIX) 40 MG tablet Take 40 mg by mouth daily as needed (acid reflux).     [provider]  Probiotic Product (PROBIOTIC DAILY PO) Take 1 tablet by mouth daily.    [provider]  psyllium (METAMUCIL) 58.6 % powder Take 1 packet by mouth daily.    [provider]  traMADol (ULTRAM) 50 MG tablet Take 1-2 tablets (50-100 mg total) by mouth every 6 (six) hours as needed for moderate pain. 09/21/19   Tommie ArdStinson, Ashley R, PA-C  warfarin (COUMADIN) 5 MG tablet Take 5 mg by mouth daily.     [provider]    Allergies Doxycycline and Sulfa antibiotics   REVIEW OF SYSTEMS  Negative except as noted here or in the History of Present Illness.   PHYSICAL EXAMINATION  Initial Vital Signs Blood pressure 108/71, pulse 92, temperature 99.8 F (37.7 C), temperature source Oral, resp. rate 18, height 5\' 7"  (1.702 m), weight 110 kg, SpO2 100 %.  Examination General: Well-developed,  well-nourished female in no acute distress; appearance consistent with age of record HENT: normocephalic; atraumatic Eyes: pupils equal, round and reactive to light; extraocular muscles intact Neck: supple Heart: regular rate and rhythm Lungs: clear to auscultation bilaterally Abdomen: soft; nondistended; nontender; no masses or hepatosplenomegaly; bowel sounds present Extremities: No deformity; full range of motion; pulses normal; tenderness of left calf without significant edema or erythema; right THA wound with surrounding erythema and mild tenderness:    Neurologic: Awake, alert and oriented;  motor function intact in all extremities and symmetric; no facial droop Skin: Warm and dry Psychiatric: Normal mood and affect   RESULTS  Summary of this visit's results, reviewed by myself:   EKG Interpretation  Date/Time:    Ventricular Rate:    PR Interval:    QRS Duration:   QT Interval:    QTC Calculation:   R Axis:     Text Interpretation:        Laboratory Studies: Results for orders placed or performed during the hospital encounter of 10/04/19 (from the past 24 hour(s))  CBC with Differential/Platelet     Status: Abnormal   Collection Time: 10/04/19 11:34 PM  Result Value Ref Range   WBC 11.4 (H) 4.0 - 10.5 K/uL   RBC 3.51 (L) 3.87 - 5.11 MIL/uL   Hemoglobin 10.8 (L) 12.0 - 15.0 g/dL   HCT 33.0 (L) 36.0 - 46.0 %   MCV 94.0 80.0 - 100.0 fL   MCH 30.8 26.0 - 34.0 pg   MCHC 32.7 30.0 - 36.0 g/dL   RDW 12.6 11.5 - 15.5 %   Platelets 316 150 - 400 K/uL   nRBC 0.0 0.0 - 0.2 %   Neutrophils Relative % 68 %   Neutro Abs 7.7 1.7 - 7.7 K/uL   Lymphocytes Relative 22 %   Lymphs Abs 2.4 0.7 - 4.0 K/uL   Monocytes Relative 9 %   Monocytes Absolute 1.0 0.1 - 1.0 K/uL   Eosinophils Relative 1 %   Eosinophils Absolute 0.1 0.0 - 0.5 K/uL   Basophils Relative 0 %   Basophils Absolute 0.0 0.0 - 0.1 K/uL   Immature Granulocytes 0 %   Abs Immature Granulocytes 0.04 0.00 - 0.07  K/uL  Basic metabolic panel     Status: Abnormal   Collection Time: 10/04/19 11:34 PM  Result Value Ref Range   Sodium 133 (L) 135 - 145 mmol/L   Potassium 3.5 3.5 - 5.1 mmol/L   Chloride 99 98 - 111 mmol/L   CO2 24 22 - 32 mmol/L   Glucose, Bld 115 (H) 70 - 99 mg/dL   BUN 15 8 - 23 mg/dL   Creatinine, Ser 0.83 0.44 - 1.00 mg/dL   Calcium 8.6 (L) 8.9 - 10.3 mg/dL   GFR calc non Af Amer >60 >60 mL/min   GFR calc Af Amer >60 >60 mL/min   Anion gap 10 5 - 15  Protime-INR     Status: Abnormal   Collection Time: 10/04/19 11:34 PM  Result Value Ref Range   Prothrombin Time 20.6 (H) 11.4 - 15.2 seconds   INR 1.8 (H) 0.8 - 1.2   Imaging Studies: US Venous Img Lower Unilateral Left  Result Date: 10/04/2019 CLINICAL DATA:  Recent hip replacement, left calf pain EXAM: Left LOWER EXTREMITY VENOUS DOPPLER ULTRASOUND TECHNIQUE: Gray-scale sonography with graded compression, as well as color Doppler and duplex ultrasound were performed to evaluate the lower extremity deep venous systems from the level of the common femoral vein and including the common femoral, femoral, profunda femoral, popliteal and calf veins including the posterior tibial, peroneal and gastrocnemius veins when visible. The superficial great saphenous vein was also interrogated. Spectral Doppler was utilized to evaluate flow at rest and with distal augmentation maneuvers in the common femoral, femoral and popliteal veins. COMPARISON:  None. FINDINGS: Contralateral Common Femoral Vein: Slightly hyperechoic filling defect at the right saphenofemoral junction. Wall thickening of right common femoral vein and profunda vein, suspect sequelae of prior DVT. Common  Femoral Vein: Nonocclusive thrombus present. Incompletely compressible. Saphenofemoral Junction: Nonocclusive thrombus present. Incompletely compressible. Profunda Femoral Vein: Nonocclusive thrombus present. Femoral Vein: Occlusive thrombus within the femoral vein. Vein is  noncompressible. Popliteal Vein: Occlusive thrombus present. Vein is noncompressible. Calf Veins: No evidence of thrombus. Normal compressibility and flow on color Doppler imaging. Superficial Great Saphenous Vein: No evidence of thrombus. Normal compressibility. IMPRESSION: 1. Positive for acute occlusive DVT from the left femoral vein to the popliteal fossa with nonocclusive thrombus present in the left common femoral vein and saphenofemoral junction. 2. 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 Electronically Signed   By: Jasmine Pang M.D.   On: 10/04/2019 22:21    ED COURSE and MDM  Nursing notes and initial vitals signs, including pulse oximetry, reviewed.  Vitals:   10/04/19 2116 10/04/19 2121 10/04/19 2344  BP:  108/71 112/63  Pulse:  92 82  Resp:  18 16  Temp:  99.8 F (37.7 C)   TempSrc:  Oral   SpO2:  100% 98%  Weight: 110 kg    Height: 5\' 7"  (1.702 m)     11:32 PM Given fever and erythema suggestive of wound infection patient was started on IV Levaquin in the ED.  This was chosen as patient is already failed outpatient therapy on Keflex.  Her INR is pending and Lovenox 1 mg/kg subcu was given for treatment of her acute DVT.  12:15 AM We will start patient on 5 days of Lovenox twice daily to bridge until her Coumadin is therapeutic.  She has an appointment with her PCP, Dr. , tomorrow but plans to call them later today.  She was also advised to call Dr. Clovis Riley office and advised him of the wound infection and the DVT.  She had originally been scheduled for a Doppler at his office today but this is not necessary as we obtained a Doppler here that was diagnostic for DVT.  She was advised her Coumadin level will need to be monitored closely while she is on Levaquin.  Wound drainage sent for culture.  PROCEDURES    ED DIAGNOSES     ICD-10-CM   1. Acute deep vein thrombosis (DVT) of femoral vein of  left lower extremity (HCC)  I82.412   2. Wound infection after surgery  T81.49XA   3. Subtherapeutic anticoagulation  Z51.81    Z79.01        Gabrille Kilbride, Deri Fuelling, MD 10/05/19 0018    10/07/19, MD 10/05/19 10/07/19

## 2019-10-05 LAB — BASIC METABOLIC PANEL
Anion gap: 10 (ref 5–15)
BUN: 15 mg/dL (ref 8–23)
CO2: 24 mmol/L (ref 22–32)
Calcium: 8.6 mg/dL — ABNORMAL LOW (ref 8.9–10.3)
Chloride: 99 mmol/L (ref 98–111)
Creatinine, Ser: 0.83 mg/dL (ref 0.44–1.00)
GFR calc Af Amer: >60 mL/min (ref >60)
GFR calc non Af Amer: >60 mL/min (ref >60)
Glucose, Bld: 115 mg/dL — ABNORMAL HIGH (ref 70–99)
Potassium: 3.5 mmol/L (ref 3.5–5.1)
Sodium: 133 mmol/L — ABNORMAL LOW (ref 135–145)

## 2019-10-05 LAB — PROTIME-INR
INR: 1.8 — ABNORMAL HIGH (ref 0.8–1.2)
Prothrombin Time: 20.6 seconds — ABNORMAL HIGH (ref 11.4–15.2)

## 2019-10-05 MED ORDER — ENOXAPARIN SODIUM 120 MG/0.8ML ~~LOC~~ SOLN: 120.0000 mg | Freq: Two times a day (BID) | SUBCUTANEOUS | 0 refills | Status: DC

## 2019-10-05 MED ORDER — WARFARIN SODIUM 5 MG PO TABS
5.0000 mg | ORAL_TABLET | Freq: Once | ORAL | Status: AC
  Administered 2019-10-05: 5 mg via ORAL
  Filled 2019-10-05: qty 1

## 2019-10-05 MED ORDER — LEVOFLOXACIN 750 MG PO TABS: 750.0000 mg | ORAL_TABLET | Freq: Every day | ORAL | 0 refills | Status: DC

## 2019-10-08 LAB — AEROBIC CULTURE W GRAM STAIN (SUPERFICIAL SPECIMEN)

## 2019-10-09 ENCOUNTER — Telehealth: Payer: Self-pay | Admitting: *Deleted

## 2019-10-09 NOTE — Telephone Encounter (Signed)
Post ED Visit - Positive Culture Follow-up  Culture report reviewed by antimicrobial stewardship pharmacist: [place] Team []  Elenor Quinones, Pharm.D. []  Heide Guile, Pharm.D., BCPS AQ-ID []  Parks Neptune, Pharm.D., BCPS []  Alycia Rossetti, Pharm.D., BCPS []  [place], [place].D., BCPS, AAHIVP []  Legrand Como, Pharm.D., BCPS, AAHIVP []  Salome Arnt, PharmD, BCPS []  Johnnette Gourd, PharmD, BCPS [x]  Hughes Better, PharmD, BCPS []  Leeroy Cha, PharmD []  Laqueta Linden, PharmD, BCPS []  Albertina Parr, PharmD  [place] Team []  Leodis Sias, PharmD []  Lindell Spar, PharmD []  Royetta Asal, PharmD []  Graylin Shiver, Rph []  Rema Fendt) Glennon Mac, PharmD []  Arlyn Dunning, PharmD []  Netta Cedars, PharmD []  Dia Sitter, PharmD []  Leone Haven, PharmD []  Gretta Arab, PharmD []  Theodis Shove, PharmD []  Peggyann Juba, PharmD []  Reuel Boom, PharmD   Positive wound culture Treated with Levofloxacin, organism sensitive to the same and no further patient follow-up is required at this time.  Harlon Flor Talley 10/09/2019, 11:04 AM

## 2020-11-18 ENCOUNTER — Other Ambulatory Visit: Payer: Self-pay | Admitting: Nurse Practitioner

## 2020-11-18 ENCOUNTER — Telehealth (HOSPITAL_COMMUNITY): Payer: Self-pay | Admitting: Emergency Medicine

## 2020-11-18 DIAGNOSIS — I447 Left bundle-branch block, unspecified: Secondary | ICD-10-CM

## 2020-11-18 DIAGNOSIS — U071 COVID-19: Secondary | ICD-10-CM

## 2020-11-18 NOTE — Telephone Encounter (Signed)
Pt daughter, Frances Furbish [phone], is an employee at [employer]. She called requesting possible monoclonal antibody treatment for pt. Sx started 11/28. Tested positive 11/29 at [facility] in [place]. Vaccinated. Sx include fever, nasal congestion, cough, and headache. Qualifying risk factors include left bundle branch block, impaired left ventricular relaxation, and obesity. Informed pt an APP will call back to possibly schedule an appointment. Pt daughter would like for an APP to call her.

## 2020-11-18 NOTE — Progress Notes (Signed)
I connected by phone with Ann Long on 11/18/2020 at 4:32 PM to discuss the potential use of a new treatment for mild to moderate COVID-19 viral infection in non-hospitalized patients.  This patient is a 69 y.o. female that meets the FDA criteria for Emergency Use Authorization of COVID monoclonal antibody casirivimab/imdevimab, bamlanivimab/eteseviamb, or sotrovimab.  Has a (+) direct SARS-CoV-2 viral test result  Has mild or moderate COVID-19   Is NOT hospitalized due to COVID-19  Is within 10 days of symptom onset  Has at least one of the high risk factor(s) for progression to severe COVID-19 and/or hospitalization as defined in EUA.  Specific high risk criteria : Older age (>/= 69 yo), BMI > 25 and Cardiovascular disease or hypertension   I have spoken and communicated the following to the patient or parent/caregiver regarding COVID monoclonal antibody treatment:  1. FDA has authorized the emergency use for the treatment of mild to moderate COVID-19 in adults and pediatric patients with positive results of direct SARS-CoV-2 viral testing who are 52 years of age and older weighing at least 40 kg, and who are at high risk for progressing to severe COVID-19 and/or hospitalization.  2. The significant known and potential risks and benefits of COVID monoclonal antibody, and the extent to which such potential risks and benefits are unknown.  3. Information on available alternative treatments and the risks and benefits of those alternatives, including clinical trials.  4. Patients treated with COVID monoclonal antibody should continue to self-isolate and use infection control measures (e.g., wear mask, isolate, social distance, avoid sharing personal items, clean and disinfect "high touch" surfaces, and frequent handwashing) according to CDC guidelines.   5. The patient or parent/caregiver has the option to accept or refuse COVID monoclonal antibody treatment.  After reviewing this  information with the patient, the patient has agreed to receive one of the available covid 19 monoclonal antibodies and will be provided an appropriate fact sheet prior to infusion. Mayra Reel, NP 11/18/2020 4:32 PM

## 2020-11-19 ENCOUNTER — Ambulatory Visit (HOSPITAL_COMMUNITY)
Admission: RE | Admit: 2020-11-19 | Discharge: 2020-11-19 | Disposition: A | Discharge status: Home / Self Care | Payer: Medicare Other | Source: Ambulatory Visit | Attending: Pulmonary Disease | Admitting: Pulmonary Disease

## 2020-11-19 ENCOUNTER — Other Ambulatory Visit (HOSPITAL_COMMUNITY): Payer: Self-pay

## 2020-11-19 DIAGNOSIS — I447 Left bundle-branch block, unspecified: Secondary | ICD-10-CM | POA: Diagnosis not present

## 2020-11-19 DIAGNOSIS — U071 COVID-19: Secondary | ICD-10-CM | POA: Diagnosis present

## 2020-11-19 DIAGNOSIS — Z23 Encounter for immunization: Secondary | ICD-10-CM | POA: Diagnosis not present

## 2020-11-19 MED ORDER — SODIUM CHLORIDE 0.9 % IV SOLN: INTRAVENOUS | Status: DC | PRN

## 2020-11-19 MED ORDER — ALBUTEROL SULFATE HFA 108 (90 BASE) MCG/ACT IN AERS: 2.0000 | INHALATION_SPRAY | Freq: Once | RESPIRATORY_TRACT | Status: DC | PRN

## 2020-11-19 MED ORDER — FAMOTIDINE IN NACL 20-0.9 MG/50ML-% IV SOLN: 20.0000 mg | Freq: Once | INTRAVENOUS | Status: DC | PRN

## 2020-11-19 MED ORDER — DIPHENHYDRAMINE HCL 50 MG/ML IJ SOLN: 50.0000 mg | Freq: Once | INTRAMUSCULAR | Status: DC | PRN

## 2020-11-19 MED ORDER — SOTROVIMAB 500 MG/8ML IV SOLN
500.0000 mg | Freq: Once | INTRAVENOUS | Status: AC
  Administered 2020-11-19: 500 mg via INTRAVENOUS

## 2020-11-19 MED ORDER — METHYLPREDNISOLONE SODIUM SUCC 125 MG IJ SOLR: 125.0000 mg | Freq: Once | INTRAMUSCULAR | Status: DC | PRN

## 2020-11-19 MED ORDER — EPINEPHRINE 0.3 MG/0.3ML IJ SOAJ: 0.3000 mg | Freq: Once | INTRAMUSCULAR | Status: DC | PRN

## 2020-11-19 NOTE — Progress Notes (Signed)
Diagnosis: COVID-19  Physician: Dr. Patrick Wright  Procedure: Covid Infusion Clinic Med: Sotrovimab infusion - Provided patient with sotrovimab fact sheet for patients, parents, and caregivers prior to infusion.   Complications: No immediate complications noted  Discharge: Discharged home    

## 2020-11-19 NOTE — Discharge Instructions (Signed)
10 Things You Can Do to Manage Your COVID-19 Symptoms at Home If you have possible or confirmed COVID-19: 1. Stay home from work and school. And stay away from other public places. If you must go out, avoid using any kind of public transportation, ridesharing, or taxis. 2. Monitor your symptoms carefully. If your symptoms get worse, call your healthcare provider immediately. 3. Get rest and stay hydrated. 4. If you have a medical appointment, call the healthcare provider ahead of time and tell them that you have or may have COVID-19. 5. For medical emergencies, call 911 and notify the dispatch personnel that you have or may have COVID-19. 6. Cover your cough and sneezes with a tissue or use the inside of your elbow. 7. Wash your hands often with soap and water for at least 20 seconds or clean your hands with an alcohol-based hand sanitizer that contains at least 60% alcohol. 8. As much as possible, stay in a specific room and away from other people in your home. Also, you should use a separate bathroom, if available. If you need to be around other people in or outside of the home, wear a mask. 9. Avoid sharing personal items with other people in your household, like dishes, towels, and bedding. 10. Clean all surfaces that are touched often, like counters, tabletops, and doorknobs. Use household cleaning sprays or wipes according to the label instructions. cdc.gov/coronavirus 06/21/2019 This information is not intended to replace advice given to you by your health care provider. Make sure you discuss any questions you have with your health care provider. Document Revised: 11/23/2019 Document Reviewed: 11/23/2019 Elsevier Patient Education  2020 Elsevier Inc.  What types of side effects do monoclonal antibody drugs cause?  Common side effects  In general, the more common side effects caused by monoclonal antibody drugs include: . Allergic reactions, such as hives or itching . Flu-like signs and  symptoms, including chills, fatigue, fever, and muscle aches and pains . Nausea, vomiting . Diarrhea . Skin rashes . Low blood pressure   The CDC is recommending patients who receive monoclonal antibody treatments wait at least 90 days before being vaccinated.  Currently, there are no data on the safety and efficacy of mRNA COVID-19 vaccines in persons who received monoclonal antibodies or convalescent plasma as part of COVID-19 treatment. Based on the estimated half-life of such therapies as well as evidence suggesting that reinfection is uncommon in the 90 days after initial infection, vaccination should be deferred for at least 90 days, as a precautionary measure until additional information becomes available, to avoid interference of the antibody treatment with vaccine-induced immune responses.  If you have any questions or concerns after the infusion please call the Advanced Practice Provider on call at [phone]. This number is only intended for your use regarding questions or concerns about the infusion post-treatment side-effects.  Please do not provide this number to others for use.   If someone you know is interested in receiving treatment please have them call the COVID hotline at [phone].   

## 2020-11-19 NOTE — Progress Notes (Signed)
Patient reviewed Fact Sheet for Patients, Parents, and Caregivers for Emergency Use Authorization (EUA) of Sotrovimab for the Treatment of Coronavirus. Patient also reviewed and is agreeable to the estimated cost of treatment. Patient is agreeable to proceed.   

## 2020-12-31 IMAGING — RF DG HIP (WITH PELVIS) OPERATIVE*R*
1 series · 2 of 2 positions shown · non-contrast
Comparison: 09/10/2028
COMPARISON: 09/10/2028

Addendum:
CLINICAL DATA: Arthroplasty.

EXAM:
OPERATIVE right HIP (WITH PELVIS IF PERFORMED) 2 VIEWS
TECHNIQUE: Fluoroscopic spot image(s) were submitted for interpretation
post-operatively.

[Series 1: unknown protocol · 0.20mm/px · 2 of 2 slices shown]
[im 1/2]
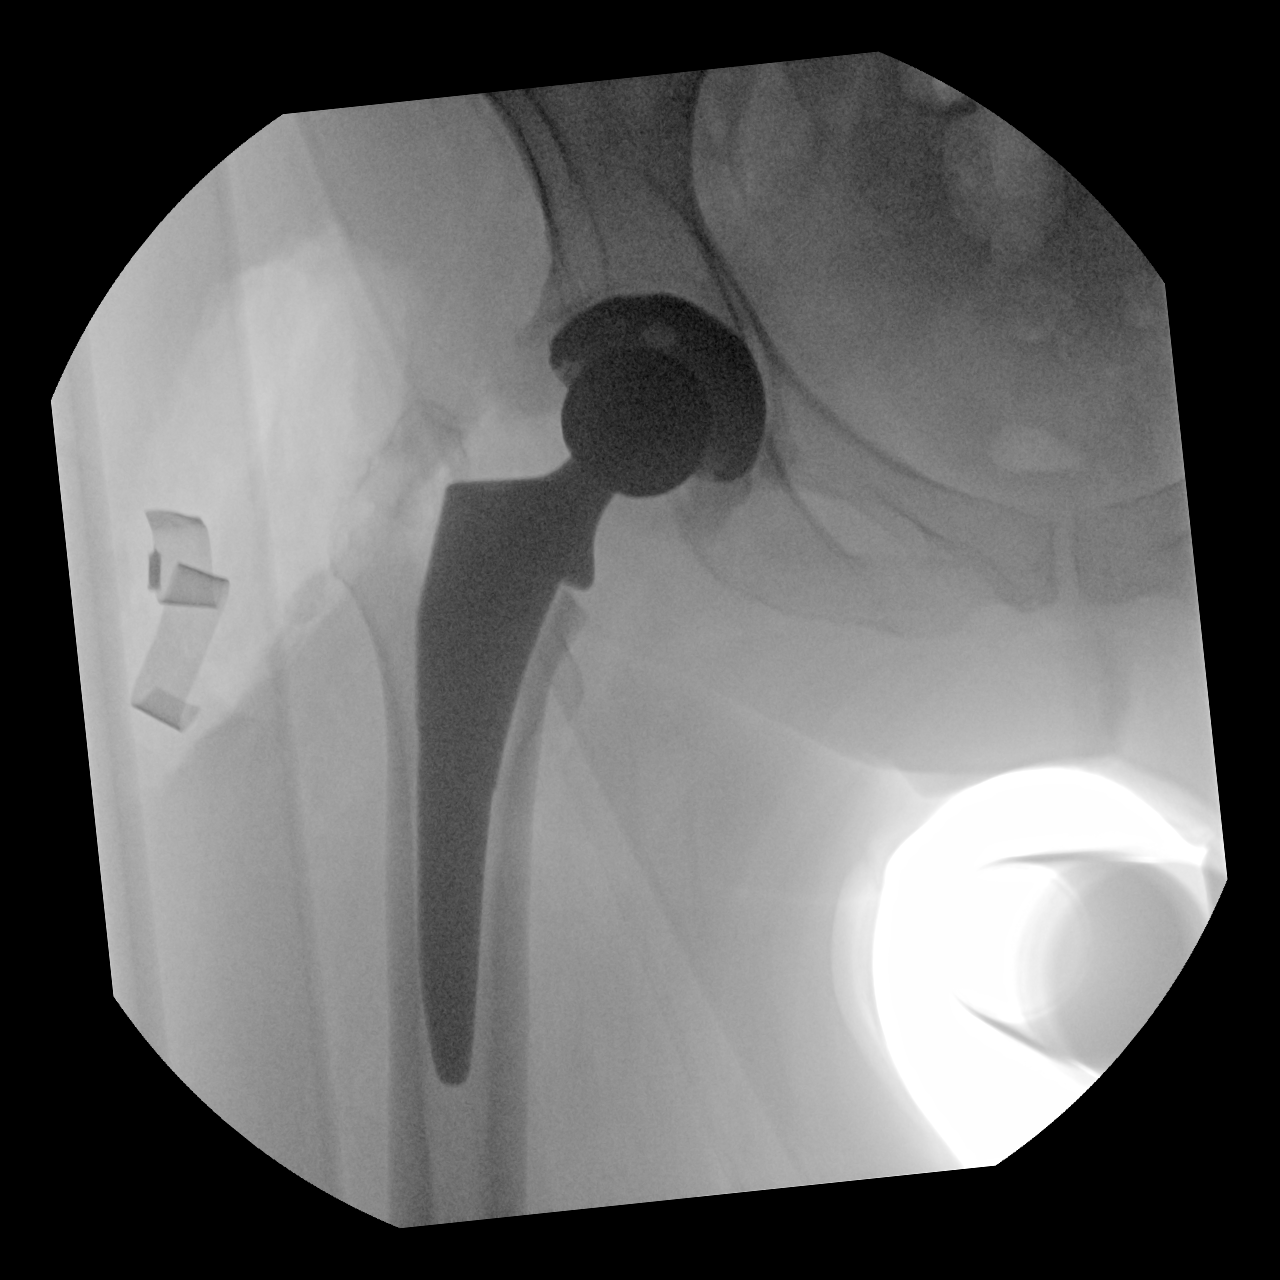
[im 2/2]
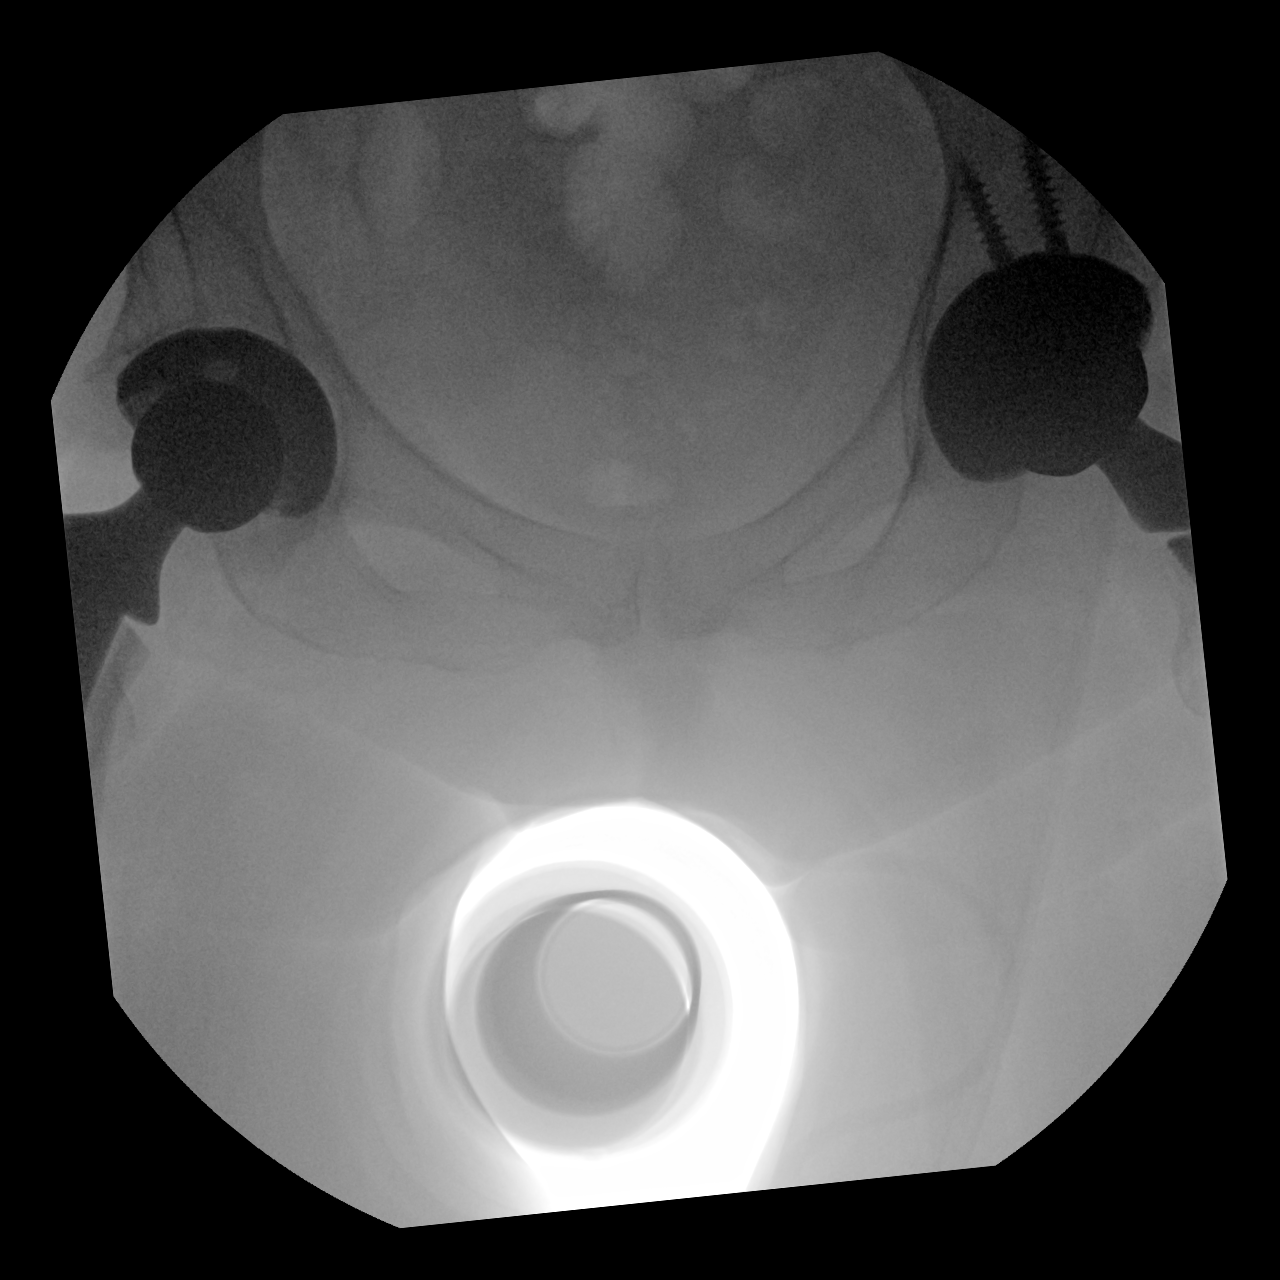

[2 of 2 positions shown; findings below may reference images not displayed]

FINDINGS: Intraoperative spot images of the bilateral hips and of the right
hip show interval changes of right hip arthroplasty without
complicating features. Marker for sponge over the soft tissues of
the right hip where there is lucency suggesting open wound.
IMPRESSION: Post right hip arthroplasty without complicating features.

Lucency overlying soft tissues of the right hip with radiopaque
marker suggesting packing material or lap sponge in this location.
Call is out to the referring provider to ensure that this is a known
finding.

ADDENDUM:
These results were called by telephone at the time of interpretation
on 09/20/2019 at [DATE] to provider MARCEL KEKE , who verbally
acknowledged these results. Provider aware of these findings as an
expected intraoperative finding.

*** End of Addendum ***
FINDINGS: Intraoperative spot images of the bilateral hips and of the right
hip show interval changes of right hip arthroplasty without
complicating features. Marker for sponge over the soft tissues of
the right hip where there is lucency suggesting open wound.
IMPRESSION: Post right hip arthroplasty without complicating features.

Lucency overlying soft tissues of the right hip with radiopaque
marker suggesting packing material or lap sponge in this location.
Call is out to the referring provider to ensure that this is a known
finding.

## 2020-12-31 IMAGING — RF DG C-ARM 1-60 MIN-NO REPORT
1 series · 2 of 2 positions shown · non-contrast
Comparison: 09/10/2028
COMPARISON: 09/10/2028

Addendum:
CLINICAL DATA: Arthroplasty.

EXAM:
OPERATIVE right HIP (WITH PELVIS IF PERFORMED) 2 VIEWS
TECHNIQUE: Fluoroscopic spot image(s) were submitted for interpretation
post-operatively.

[Series 1: unknown protocol · 0.20mm/px · 2 of 2 slices shown]
[im 1/2]
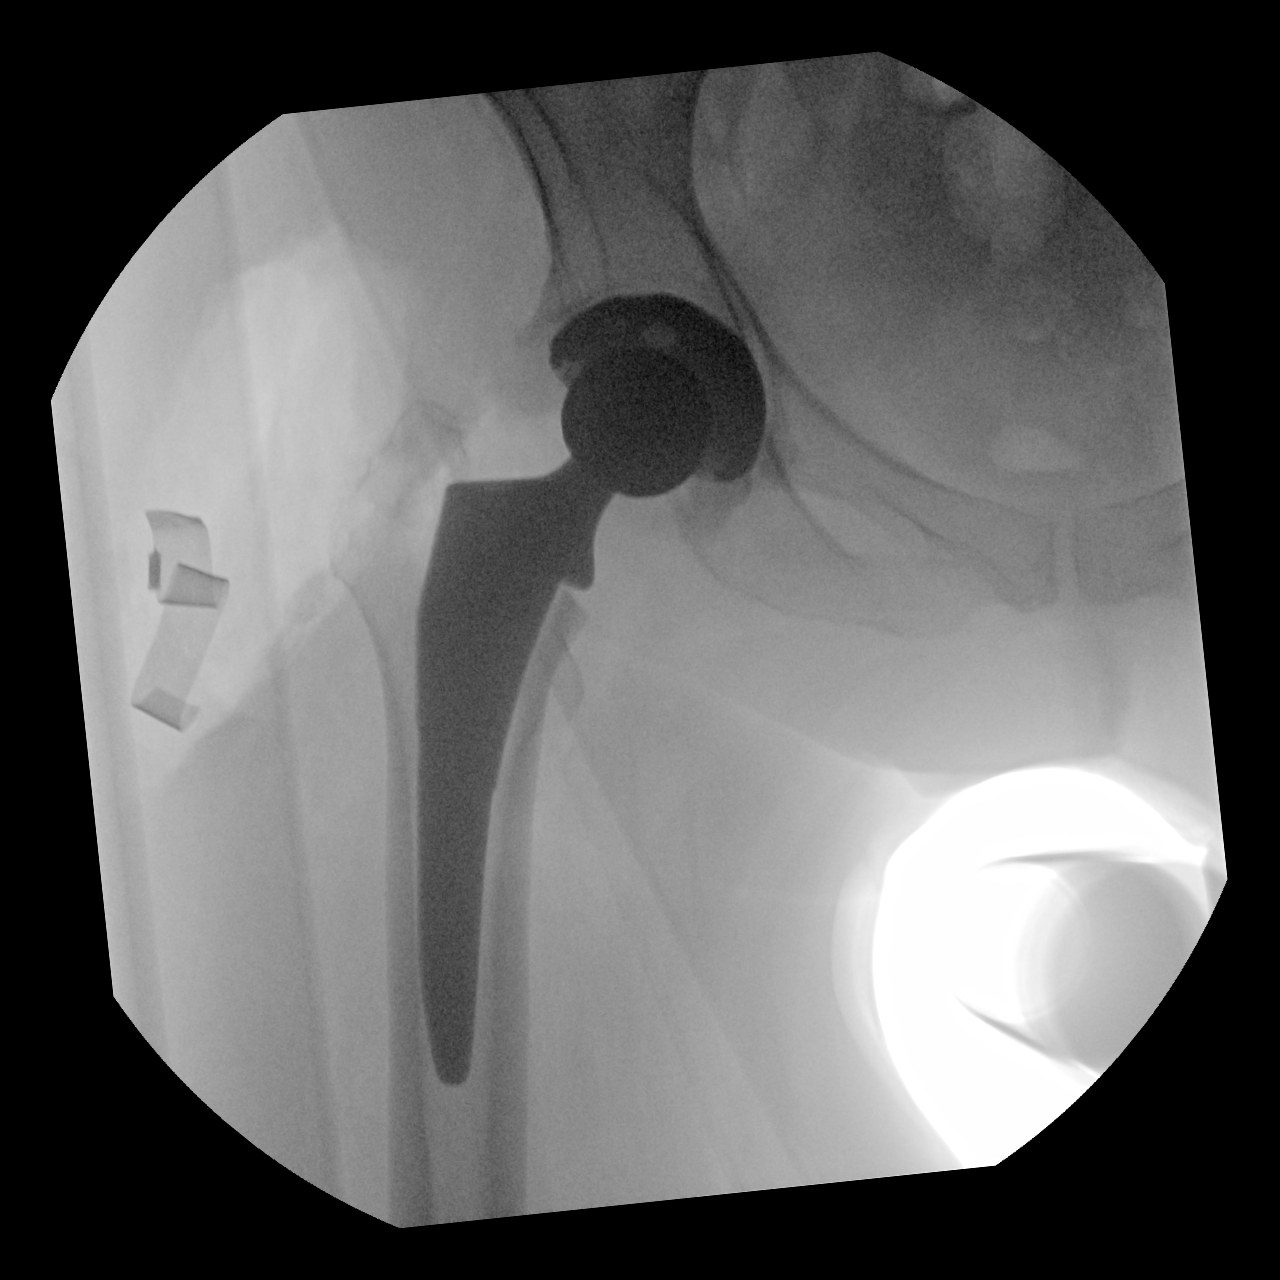
[im 2/2]
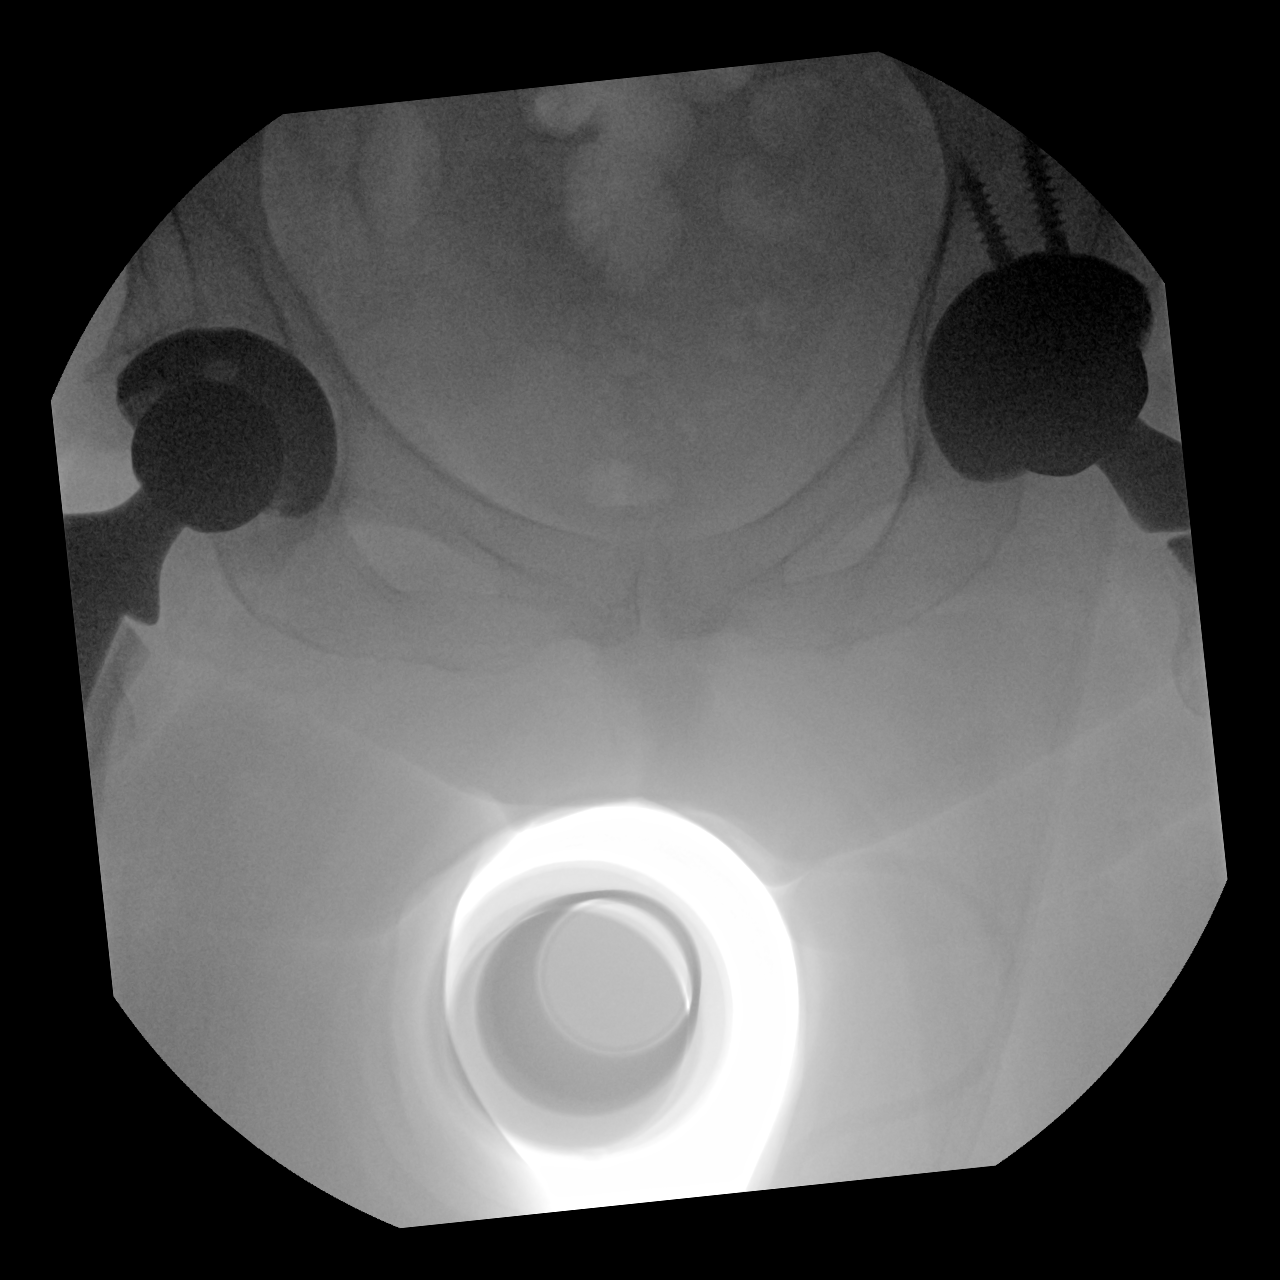

[2 of 2 positions shown; findings below may reference images not displayed]

FINDINGS: Intraoperative spot images of the bilateral hips and of the right
hip show interval changes of right hip arthroplasty without
complicating features. Marker for sponge over the soft tissues of
the right hip where there is lucency suggesting open wound.
IMPRESSION: Post right hip arthroplasty without complicating features.

Lucency overlying soft tissues of the right hip with radiopaque
marker suggesting packing material or lap sponge in this location.
Call is out to the referring provider to ensure that this is a known
finding.

ADDENDUM:
These results were called by telephone at the time of interpretation
on 09/20/2019 at [DATE] to provider MARCEL KEKE , who verbally
acknowledged these results. Provider aware of these findings as an
expected intraoperative finding.

*** End of Addendum ***
FINDINGS: Intraoperative spot images of the bilateral hips and of the right
hip show interval changes of right hip arthroplasty without
complicating features. Marker for sponge over the soft tissues of
the right hip where there is lucency suggesting open wound.
IMPRESSION: Post right hip arthroplasty without complicating features.

Lucency overlying soft tissues of the right hip with radiopaque
marker suggesting packing material or lap sponge in this location.
Call is out to the referring provider to ensure that this is a known
finding.

## 2021-03-24 ENCOUNTER — Other Ambulatory Visit: Payer: Self-pay

## 2021-03-24 ENCOUNTER — Ambulatory Visit (INDEPENDENT_AMBULATORY_CARE_PROVIDER_SITE_OTHER): Payer: Medicare Other | Admitting: Family Medicine

## 2021-03-24 ENCOUNTER — Encounter: Payer: Self-pay | Admitting: Family Medicine

## 2021-03-24 VITALS — BP 118/80 | HR 65 | Temp 97.8°F | Ht 67.0 in | Wt 240.8 lb

## 2021-03-24 DIAGNOSIS — Z7901 Long term (current) use of anticoagulants: Secondary | ICD-10-CM

## 2021-03-24 DIAGNOSIS — Z13220 Encounter for screening for lipoid disorders: Secondary | ICD-10-CM

## 2021-03-24 DIAGNOSIS — K219 Gastro-esophageal reflux disease without esophagitis: Secondary | ICD-10-CM

## 2021-03-24 DIAGNOSIS — M810 Age-related osteoporosis without current pathological fracture: Secondary | ICD-10-CM | POA: Diagnosis not present

## 2021-03-24 DIAGNOSIS — I10 Essential (primary) hypertension: Secondary | ICD-10-CM

## 2021-03-24 DIAGNOSIS — Z86718 Personal history of other venous thrombosis and embolism: Secondary | ICD-10-CM

## 2021-03-24 DIAGNOSIS — K589 Irritable bowel syndrome without diarrhea: Secondary | ICD-10-CM | POA: Insufficient documentation

## 2021-03-24 DIAGNOSIS — J302 Other seasonal allergic rhinitis: Secondary | ICD-10-CM

## 2021-03-24 DIAGNOSIS — K580 Irritable bowel syndrome with diarrhea: Secondary | ICD-10-CM

## 2021-03-24 LAB — COMPREHENSIVE METABOLIC PANEL
ALT: 12 U/L (ref 0–35)
AST: 12 U/L (ref 0–37)
Albumin: 4.4 g/dL (ref 3.5–5.2)
Alkaline Phosphatase: 55 U/L (ref 39–117)
BUN: 20 mg/dL (ref 6–23)
CO2: 29 mEq/L (ref 19–32)
Calcium: 9.7 mg/dL (ref 8.4–10.5)
Chloride: 102 mEq/L (ref 96–112)
Creatinine, Ser: 0.86 mg/dL (ref 0.40–1.20)
GFR: 68.96 mL/min (ref >60.00)
Glucose, Bld: 87 mg/dL (ref 70–99)
Potassium: 4.5 mEq/L (ref 3.5–5.1)
Sodium: 138 mEq/L (ref 135–145)
Total Bilirubin: 0.6 mg/dL (ref 0.2–1.2)
Total Protein: 7.3 g/dL (ref 6.0–8.3)

## 2021-03-24 LAB — CBC WITH DIFFERENTIAL/PLATELET
Basophils Absolute: 0 10*3/uL (ref 0.0–0.1)
Basophils Relative: 0.7 % (ref 0.0–3.0)
Eosinophils Absolute: 0.1 10*3/uL (ref 0.0–0.7)
Eosinophils Relative: 1.4 % (ref 0.0–5.0)
HCT: 42.3 % (ref 36.0–46.0)
Hemoglobin: 14.1 g/dL (ref 12.0–15.0)
Lymphocytes Relative: 33.6 % (ref 12.0–46.0)
Lymphs Abs: 2 10*3/uL (ref 0.7–4.0)
MCHC: 33.3 g/dL (ref 30.0–36.0)
MCV: 93.4 fl (ref 78.0–100.0)
Monocytes Absolute: 0.4 10*3/uL (ref 0.1–1.0)
Monocytes Relative: 6.6 % (ref 3.0–12.0)
Neutro Abs: 3.4 10*3/uL (ref 1.4–7.7)
Neutrophils Relative %: 57.7 % (ref 43.0–77.0)
Platelets: 331 10*3/uL (ref 150.0–400.0)
RBC: 4.54 Mil/uL (ref 3.87–5.11)
RDW: 13.5 % (ref 11.5–15.5)
WBC: 6 10*3/uL (ref 4.0–10.5)

## 2021-03-24 LAB — LDL CHOLESTEROL, DIRECT: Direct LDL: 110 mg/dL

## 2021-03-24 LAB — LIPID PANEL
Cholesterol: 181 mg/dL (ref 0–200)
HDL: 38.2 mg/dL — ABNORMAL LOW (ref >39.00)
NonHDL: 142.4
Total CHOL/HDL Ratio: 5
Triglycerides: 203 mg/dL — ABNORMAL HIGH (ref 0.0–149.0)
VLDL: 40.6 mg/dL — ABNORMAL HIGH (ref 0.0–40.0)

## 2021-03-24 LAB — VITAMIN D 25 HYDROXY (VIT D DEFICIENCY, FRACTURES): VITD: 42.39 ng/mL (ref 30.00–100.00)

## 2021-03-24 LAB — PROTIME-INR
INR: 2.1 ratio — ABNORMAL HIGH (ref 0.8–1.0)
Prothrombin Time: 23.4 s — ABNORMAL HIGH (ref 9.6–13.1)

## 2021-03-24 MED ORDER — RIVAROXABAN 10 MG PO TABS: 10.0000 mg | ORAL_TABLET | Freq: Every day | ORAL | 0 refills | Status: DC

## 2021-03-24 NOTE — Patient Instructions (Signed)
Wait until I call with bloodwork before starting xarelto. I will instruct you on how to do this once I call.

## 2021-03-24 NOTE — Progress Notes (Signed)
Ann Long DOB: 1951-04-05 Encounter date: 03/24/2021  This isa 70 y.o. female who presents to establish care. Chief Complaint  Patient presents with  . Establish Care    History of present illness:  Lost husband in December due to complications with COVID.   She is transitioning care here due to some lack of confidence in care recently. She had COVID in December, she had post-surgical clot.   Has had 4 DVT's; did have evaluation and this was negative. Has been pretty stable lately on the warfarin - had PT on 3/16 and was 2.5. she has thought about trying other medications for management, but she hadn't had problems with coumadin in the past.   HTN: hctz 25mg  has been well controlled for years.   Osteoporosis: has been on fosamax for about a year; [facility] - Dr. . Sees him regularly for gyn health. Remote hx of abnormal; has been normal recently. Taking vitamin D 2000 units daily.   GI: Dr. Arelia Sneddon - last colonoscopy 2020 or 2021; repeat in 5 years.   GERD: takes protonix just as needed. Just with flares. Also has IBS. Follows with GI; just hyoscyamine as needed.   Seasonal allergies: flonase, ipratropium just for more significant congestion (sees Dr. 2022), allegra.     Past Medical History:  Diagnosis Date  . Abnormal vaginal Pap smear   . Arthritis   . 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 Gallbladder disease   . Heart murmur   . Hypertension   . Jaundice    as a child, her sister was diagnosed with it due to ingestion hepatitis, she later develped januce and thpugh was not testes herself , was presumed to also have the same time of hepatitis and was subsequesntly treated with globulin '  . Left bundle branch block    "my pcp told me years ago i had it , 2 -3 years ago my obgyn heard a heart murmur and  sent me to [cardiology] where they did a ultrasound of my heart , they didnt find anything definitive" , denies chest pain , sob, syncope   . Osteoarthritis   . Osteoporosis   . Phlebitis   . PONV (postoperative nausea and vomiting)    Past Surgical History:  Procedure Laterality Date  . ABLATION  uterine   ?1995  . CHOLECYSTECTOMY  over 10 years ago  . REPLACEMENT TOTAL HIP W/  RESURFACING IMPLANTS    . TOTAL HIP ARTHROPLASTY Left 2009   at [place]   . TOTAL HIP ARTHROPLASTY Right 09/20/2019   Procedure: TOTAL HIP ARTHROPLASTY ANTERIOR APPROACH;  Surgeon: 09/22/2019, MD;  Location: [facility];  Service: Orthopedics;  Laterality: Right;  Ollen Gross  . TUBAL LIGATION  1984   Allergies  Allergen Reactions  . Doxycycline Diarrhea  . Sulfa Antibiotics Rash   Current Meds  Medication Sig  . alendronate (FOSAMAX) 70 MG tablet Take 70 mg by mouth once a week. Take with a full glass of water on an empty stomach.  amoxicillin (AMOXIL) 500 MG capsule Take 500 mg by mouth 2 (two) times daily. FOR DENTIST  . cholecalciferol (VITAMIN D) 1000 units tablet Take 2,000 Units by mouth daily.  Marland Kitchen docusate sodium (COLACE) 100 MG capsule Take 100 mg by mouth daily.  Marland Kitchen  Fexofenadine HCl (ALLEGRA PO) Take by mouth.  . fluticasone (FLONASE) 50 MCG/ACT nasal spray Place 1 spray into both nostrils daily as needed for allergies or rhinitis.  . hydrochlorothiazide (HYDRODIURIL) 25 MG tablet Take 25 mg by mouth daily.  . hyoscyamine (ANASPAZ) 0.125 MG TBDP disintergrating tablet Place 0.125 mg under the tongue every 6 (six) hours as needed for cramping.  . IPRATROPIUM BROMIDE NA Place into the nose as needed.  Marland Kitchen lisinopril (PRINIVIL,ZESTRIL) 5 MG tablet Take 5 mg by mouth daily.  . pantoprazole (PROTONIX) 40 MG tablet Take 40 mg by mouth daily as needed (acid reflux).   . psyllium (METAMUCIL) 58.6 % powder Take 1 packet by mouth daily.  . rivaroxaban (XARELTO) 10 MG TABS tablet Take 1 tablet (10 mg total) by  mouth daily.  Marland Kitchen warfarin (COUMADIN) 5 MG tablet Take 5 mg by mouth daily.    Social History   Tobacco Use  . Smoking status: Never Smoker  . Smokeless tobacco: Never Used  Substance Use Topics  . Alcohol use: No   Family History  Problem Relation Age of Onset  . Hypertension Mother   . Stroke Mother 52  . Osteoporosis Mother   . Heart disease Mother        in chronic a fib  . Heart attack Father 84       fatal  . Stroke Father 64  . Diabetes Father        ?not diagnosed  . Heart attack Sister   . Diabetes Sister   . Hypertension Sister   . Kidney failure Sister   . Atrial fibrillation Sister   . Heart attack Sister 18  . Heart disease Sister        enlarged heart  . Hypertension Sister   . Diabetes Sister   . Heart attack Paternal Grandfather 45  . Stroke Sister 62  . Hypertension Sister   . Atrial fibrillation Sister        multiple conversions  . Diabetes Brother   . Blindness Maternal Grandmother   . Deafness Maternal Grandmother   . Heart failure Maternal Grandmother 95  . Hypertension Maternal Grandmother   . Stomach cancer Maternal Grandfather   . Hypertension Maternal Grandfather   . Heart disease Paternal Uncle 40  . Diabetes Paternal Uncle      Review of Systems  Constitutional: Negative for chills, fatigue and fever.  Respiratory: Negative for cough, chest tightness, shortness of breath and wheezing.   Cardiovascular: Negative for chest pain, palpitations and leg swelling.    Objective:  BP 118/80 (BP Location: Left Arm, Patient Position: Sitting, Cuff Size: Large)   Pulse 65   Temp 97.8 F (36.6 C) (Oral)   Ht 5\' 7"  (1.702 m)   Wt 240 lb 12.8 oz (109.2 kg)   SpO2 96%   BMI 37.71 kg/m   Weight: 240 lb 12.8 oz (109.2 kg)   BP Readings from Last 3 Encounters:  03/24/21 118/80  11/19/20 136/87  10/04/19 112/63   Wt Readings from Last 3 Encounters:  03/24/21 240 lb 12.8 oz (109.2 kg)  10/04/19 242 lb 8.1 oz (110 kg)  09/20/19 242 lb  15.2 oz (110.2 kg)    Physical Exam Constitutional:      General: She is not in acute distress.    Appearance: She is well-developed.  Cardiovascular:     Rate and Rhythm: Normal rate and regular rhythm.     Heart sounds: Murmur heard.   Systolic murmur is  present with a grade of 2/6. No friction rub.  Pulmonary:     Effort: Pulmonary effort is normal. No respiratory distress.     Breath sounds: Normal breath sounds. No wheezing or rales.  Musculoskeletal:     Right lower leg: No edema.     Left lower leg: No edema.  Neurological:     Mental Status: She is alert and oriented to person, place, and time.  Psychiatric:        Behavior: Behavior normal.     Assessment/Plan:  1. History of DVT (deep vein thrombosis) We are going to transition from Coumadin to Xarelto.  I will give her instructions pending lab work since we will need to check current INR level before being able to do this.  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  2. Hypertension, unspecified type Well-controlled.  Continue lisinopril 5 mg daily, hydrochlorothiazide 25 mg daily. - CBC with Differential/Platelet; Future - Comprehensive metabolic panel; Future  3. Osteoporosis, unspecified osteoporosis type, unspecified pathological fracture presence Continue with vitamin D 2000 units daily, Fosamax.  Managed through gynecology. - VITAMIN D 25 Hydroxy (Vit-D Deficiency, Fractures); Future  4. Chronic anticoagulation See above.  We will switch over to Xarelto pending lab results. - Protime-INR; Future  5. Irritable bowel syndrome with diarrhea Hyoscyamine as needed for cramping.  Does quite well with this.  Colace daily as needed for bowel regularity.  6. Seasonal allergies Continue with Allegra, Flonase.  Stable with these.  7. Gastroesophageal reflux disease, unspecified whether esophagitis present Protonix 40 mg daily.  8. Lipid screening - Lipid  panel; Future  Return in about 6 months (around 09/23/2021) for Chronic condition visit.  Theodis Shove, MD

## 2021-03-25 ENCOUNTER — Other Ambulatory Visit: Payer: Self-pay | Admitting: Family Medicine

## 2021-03-25 MED ORDER — RIVAROXABAN 10 MG PO TABS
10.0000 mg | ORAL_TABLET | Freq: Every day | ORAL | 3 refills | Status: DC
Start: 2021-03-25 — End: 2021-09-24

## 2021-03-31 ENCOUNTER — Telehealth: Payer: Self-pay | Admitting: Family Medicine

## 2021-03-31 NOTE — Telephone Encounter (Signed)
error 

## 2021-04-21 ENCOUNTER — Telehealth: Payer: Self-pay | Admitting: Family Medicine

## 2021-04-21 NOTE — Telephone Encounter (Signed)
Did she start the xarelto? If so when and is she tolerating ok? And did she stop the coumadin when starting the xarelto (just making sure).   If she is strictly on the xarelto, we no longer will need to check INR.

## 2021-04-21 NOTE — Telephone Encounter (Signed)
Spoke with the pt and informed her of the message below.  Patient stated she has not started Xarelto and is still taking Coumadin.  Stated she spoke with Dr Hassan Rowan previously and told her she was awaiting the Xarelto from her mail order pharmacy, just received on Saturday and to await the results as she has INR testing monthly.  Message sent to PCP.

## 2021-04-21 NOTE — Telephone Encounter (Signed)
Patient is calling and wanted to see when provider wanted her to come in to check her INR levels, please advise. CB is [phone]

## 2021-04-22 NOTE — Telephone Encounter (Signed)
Patient is calling back and stated that she has a question about medication and is asking for a call back, please advise. CB is [phone]

## 2021-04-23 ENCOUNTER — Other Ambulatory Visit: Payer: Self-pay | Admitting: Family Medicine

## 2021-04-23 DIAGNOSIS — Z7901 Long term (current) use of anticoagulants: Secondary | ICD-10-CM

## 2021-04-23 NOTE — Telephone Encounter (Signed)
Patient informed of the message below and a lab appt was scheduled for 5/6.

## 2021-04-23 NOTE — Telephone Encounter (Signed)
Thanks for the update. I have ordered an INR to be completed at lab (please set up appointment for her). We do want this value before she starts the xarelto. I will call her to give directions again once we get this result. Please let her know I am out this week but I will call her if she has questions once we update this INR.   Thanks!

## 2021-04-24 ENCOUNTER — Other Ambulatory Visit: Payer: Self-pay

## 2021-04-25 ENCOUNTER — Other Ambulatory Visit (INDEPENDENT_AMBULATORY_CARE_PROVIDER_SITE_OTHER): Payer: Medicare Other

## 2021-04-25 DIAGNOSIS — Z7901 Long term (current) use of anticoagulants: Secondary | ICD-10-CM

## 2021-04-25 LAB — PROTIME-INR
INR: 2.3 ratio — ABNORMAL HIGH (ref 0.8–1.0)
Prothrombin Time: 25.1 s — ABNORMAL HIGH (ref 9.6–13.1)

## 2021-06-24 ENCOUNTER — Telehealth: Payer: Self-pay | Admitting: Family Medicine

## 2021-06-24 NOTE — Telephone Encounter (Signed)
Patient called back to let Dr. Hassan Rowan know that she called [facility] and they said that she still has 2 refills for those medications and they will send her medication to her and when she is out of refills that they will send a request to Dr. Hassan Rowan for further refills.  Patient doesn't need refills sent in at this time

## 2021-06-24 NOTE — Telephone Encounter (Signed)
hydrochlorothiazide (HYDRODIURIL) 25 MG tablet  lisinopril (PRINIVIL,ZESTRIL) 5 MG tablet  [facility] ([facility]) - [place], [place] - [address] Phone:  [phone]  Fax:  [phone]

## 2021-06-25 NOTE — Telephone Encounter (Signed)
Noted  

## 2021-07-18 NOTE — Progress Notes (Deleted)
Subjective:   Ann Long is a 70 y.o. female who presents for an Initial Medicare Annual Wellness Visit.  Review of Systems    N/a       Objective:    There were no vitals filed for this visit. There is no height or weight on file to calculate BMI.  Advanced Directives 10/04/2019 09/20/2019 09/14/2019  Does Patient Have a Medical Advance Directive? No No No  Would patient like information on creating a medical advance directive? - No - Patient declined No - Patient declined    Current Medications (verified) Outpatient Encounter Medications as of 07/18/2021  Medication Sig   alendronate (FOSAMAX) 70 MG tablet Take 70 mg by mouth once a week. Take with a full glass of water on an empty stomach.   amoxicillin (AMOXIL) 500 MG capsule Take 500 mg by mouth 2 (two) times daily. FOR DENTIST   cholecalciferol (VITAMIN D) 1000 units tablet Take 2,000 Units by mouth daily.   docusate sodium (COLACE) 100 MG capsule Take 100 mg by mouth daily.   Fexofenadine HCl (ALLEGRA PO) Take by mouth.   fluticasone (FLONASE) 50 MCG/ACT nasal spray Place 1 spray into both nostrils daily as needed for allergies or rhinitis.   hydrochlorothiazide (HYDRODIURIL) 25 MG tablet Take 25 mg by mouth daily.   hyoscyamine (ANASPAZ) 0.125 MG TBDP disintergrating tablet Place 0.125 mg under the tongue every 6 (six) hours as needed for cramping.   IPRATROPIUM BROMIDE NA Place into the nose as needed.   lisinopril (PRINIVIL,ZESTRIL) 5 MG tablet Take 5 mg by mouth daily.   pantoprazole (PROTONIX) 40 MG tablet Take 40 mg by mouth daily as needed (acid reflux).    psyllium (METAMUCIL) 58.6 % powder Take 1 packet by mouth daily.   rivaroxaban (XARELTO) 10 MG TABS tablet Take 1 tablet (10 mg total) by mouth daily.   No facility-administered encounter medications on file as of 07/18/2021.    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, then one in the left leg, then one in the left kidney --all were onsearate occasions    Gallbladder disease    Heart murmur    Hypertension    Jaundice    as a child, her sister was diagnosed with it due to ingestion hepatitis, she later develped januce and thpugh was not testes herself , was presumed to also have the same time of hepatitis and was subsequesntly treated with globulin '   Left bundle branch block    "my pcp told me years ago i had it , 2 -3 years ago my obgyn heard a heart murmur and sent me to [cardiology] where they did a ultrasound of my heart , they didnt find anything definitive" , denies chest pain , sob, syncope    Osteoarthritis    Osteoporosis    Phlebitis    PONV (postoperative nausea and vomiting)    Past Surgical History:  Procedure Laterality Date   ABLATION  uterine   ?1995   CHOLECYSTECTOMY  over 10 years ago   REPLACEMENT TOTAL HIP W/  RESURFACING IMPLANTS     TOTAL HIP ARTHROPLASTY Left 2009   at [place]    TOTAL HIP ARTHROPLASTY Right 09/20/2019   Procedure: TOTAL HIP ARTHROPLASTY ANTERIOR APPROACH;  Surgeon: Ollen Gross, MD;  Location: [facility];  Service: Orthopedics;  Laterality: Right;    TUBAL LIGATION  1984   Family History  Problem Relation Age of Onset   Hypertension Mother    Stroke Mother 48   Osteoporosis Mother    Heart disease Mother        in chronic a fib   Heart attack Father 68       fatal   Stroke Father 39   Diabetes Father        ?not diagnosed   Heart attack Sister    Diabetes Sister    Hypertension Sister    Kidney failure Sister    Atrial fibrillation Sister    Heart attack Sister 63   Heart disease Sister        enlarged heart   Hypertension Sister    Diabetes Sister    Heart attack Paternal Grandfather 57   Stroke Sister 18   Hypertension Sister    Atrial fibrillation Sister         multiple conversions   Diabetes Brother    Blindness Maternal Grandmother    Deafness Maternal Grandmother    Heart failure Maternal Grandmother 95   Hypertension Maternal Grandmother    Stomach cancer Maternal Grandfather    Hypertension Maternal Grandfather    Heart disease Paternal Uncle 13   Diabetes Paternal Uncle    Social History   Socioeconomic History   Marital status: Widowed    Spouse name: Not on file   Number of children: Not on file   Years of education: Not on file   Highest education level: Not on file  Occupational History   Not on file  Tobacco Use   Smoking status: Never   Smokeless tobacco: Never  Substance and Sexual Activity   Alcohol use: No   Drug use: No   Sexual activity: Not on file  Other Topics Concern   Not on file  Social History Narrative   Not on file   Social Determinants of Health   Financial Resource Strain: Not on file  Food Insecurity: Not on file  Transportation Needs: Not on file  Physical Activity: Not on file  Stress: Not on file  Social Connections: Not on file    Tobacco Counseling Counseling given: Not Answered   Clinical Intake:                 Diabetic?no         Activities of Daily Living No flowsheet data found.  Patient Care Team: Wynn Banker, MD as PCP - General (Family Medicine) Richardean Chimera, MD as Consulting Physician (Obstetrics and Gynecology) Kerin Salen, MD as Consulting Physician (Gastroenterology)  Indicate any recent Medical Services you may have received from other than [facility] providers in the past year (date may be approximate).     Assessment:   This is a routine wellness examination for Ann Long.  Hearing/Vision screen No results found.  Dietary issues and exercise activities discussed:     Goals Addressed   None    Depression Screen No flowsheet data found.  Fall Risk No flowsheet data found.  FALL RISK PREVENTION PERTAINING TO THE HOME:  Any stairs in  or around the home? {YES/NO:21197} If so, are there any without handrails? {YES/NO:21197} Home free of loose throw rugs in walkways, pet beds, electrical cords, etc? {YES/NO:21197} Adequate lighting in your home to reduce risk of falls? {YES/NO:21197}  ASSISTIVE DEVICES UTILIZED TO PREVENT FALLS:  Life alert? {YES/NO:21197} Use of a cane, walker or w/c? {YES/NO:21197} Grab bars in the bathroom? {YES/NO:21197} Shower chair or bench in shower? {YES/NO:21197} Elevated toilet seat or a handicapped toilet? {YES/NO:21197}  TIMED UP AND GO:  Was the test performed? {YES/NO:21197}.  Length of time to ambulate 10 feet: *** sec.   {Appearance of LKTG:2563893}  Cognitive Function:        Immunizations Immunization History  Administered Date(s) Administered   Influenza, High Dose Seasonal PF 09/13/2017, 09/07/2018, 09/01/2019, 10/08/2020   Moderna Sars-Covid-2 Vaccination 02/24/2021   PFIZER(Purple Top)SARS-COV-2 Vaccination 12/22/2019, 01/22/2020   Pneumococcal Conjugate-13 12/29/2016   Pneumococcal Polysaccharide-23 07/20/2018   Td 07/20/2018   Zoster, Live 08/30/2012    {TDAP status:2101805}  {Flu Vaccine status:2101806}  {Pneumococcal vaccine status:2101807}  {Covid-19 vaccine status:2101808}  Qualifies for Shingles Vaccine? {YES/NO:21197}  Zostavax completed {YES/NO:21197}  {Shingrix Completed?:2101804}  Screening Tests Health Maintenance  Topic Date Due   Zoster Vaccines- Shingrix (1 of 2) Never done   DEXA SCAN  Never done   COVID-19 Vaccine (4 - Booster for Pfizer series) 06/26/2021   Hepatitis C Screening  03/24/2022 (Originally 11/13/1969)   INFLUENZA VACCINE  07/21/2021   MAMMOGRAM  03/24/2022   TETANUS/TDAP  07/20/2028   COLONOSCOPY (Pts 45-28yrs Insurance coverage will need to be confirmed)  03/24/2030   PNA vac Low Risk Adult  Completed   HPV VACCINES  Aged Out    Health Maintenance  Health Maintenance Due  Topic Date Due   Zoster Vaccines-  Shingrix (1 of 2) Never done   DEXA SCAN  Never done   COVID-19 Vaccine (4 - Booster for Pfizer series) 06/26/2021    {Colorectal cancer screening:2101809}  {Mammogram status:21018020}  {Bone Density status:21018021}  Lung Cancer Screening: (Low Dose CT Chest recommended if Age 38-80 years, 30 pack-year currently smoking OR have quit w/in 15years.) {DOES NOT does:27190::"does not"} qualify.   Lung Cancer Screening Referral: ***  Additional Screening:  Hepatitis C Screening: {DOES NOT does:27190::"does not"} qualify; Completed ***  Vision Screening: Recommended annual ophthalmology exams for early detection of glaucoma and other disorders of the eye. Is the patient up to date with their annual eye exam?  {YES/NO:21197} Who is the provider or what is the name of the office in which the patient attends annual eye exams? *** If pt is not established with a provider, would they like to be referred to a provider to establish care? {YES/NO:21197}.   Dental Screening: Recommended annual dental exams for proper oral hygiene  Community Resource Referral / Chronic Care Management: CRR required this visit?  {YES/NO:21197}  CCM required this visit?  {YES/NO:21197}     Plan:     I have personally reviewed and noted the following in the patient's chart:   Medical and social history Use of alcohol, tobacco or illicit drugs  Current medications and supplements including opioid prescriptions. {Opioid [phone]} Functional ability and status Nutritional status Physical activity Advanced directives List of other physicians Hospitalizations, surgeries, and ER visits in previous 12 months Vitals Screenings to include cognitive, depression, and falls Referrals and appointments  In addition, I have reviewed and discussed with patient certain preventive protocols, quality metrics, and best practice recommendations. A written personalized care plan for preventive services as  well as general preventive health recommendations were provided to patient.     March Rummage, LPN   7/34/2876   Nurse Notes: ***

## 2021-07-25 ENCOUNTER — Ambulatory Visit: Payer: Medicare Other

## 2021-08-07 ENCOUNTER — Ambulatory Visit (INDEPENDENT_AMBULATORY_CARE_PROVIDER_SITE_OTHER): Payer: Medicare Other

## 2021-08-07 ENCOUNTER — Other Ambulatory Visit: Payer: Self-pay

## 2021-08-07 VITALS — BP 122/70 | HR 73 | Temp 98.2°F | Ht 67.0 in | Wt 240.0 lb

## 2021-08-07 DIAGNOSIS — Z0000 Encounter for general adult medical examination without abnormal findings: Secondary | ICD-10-CM | POA: Diagnosis not present

## 2021-08-07 LAB — HM MAMMOGRAPHY

## 2021-08-07 NOTE — Progress Notes (Signed)
Subjective:   Ann Long is a 70 y.o. female who presents for an Initial Medicare Annual Wellness Visit.  Review of Systems    N/a       Objective:    There were no vitals filed for this visit. There is no height or weight on file to calculate BMI.  Advanced Directives 10/04/2019 09/20/2019 09/14/2019  Does Patient Have a Medical Advance Directive? No No No  Would patient like information on creating a medical advance directive? - No - Patient declined No - Patient declined    Current Medications (verified) Outpatient Encounter Medications as of 08/07/2021  Medication Sig   alendronate (FOSAMAX) 70 MG tablet Take 70 mg by mouth once a week. Take with a full glass of water on an empty stomach.   amoxicillin (AMOXIL) 500 MG capsule Take 500 mg by mouth 2 (two) times daily. FOR DENTIST   cholecalciferol (VITAMIN D) 1000 units tablet Take 2,000 Units by mouth daily.   docusate sodium (COLACE) 100 MG capsule Take 100 mg by mouth daily.   Fexofenadine HCl (ALLEGRA PO) Take by mouth.   fluticasone (FLONASE) 50 MCG/ACT nasal spray Place 1 spray into both nostrils daily as needed for allergies or rhinitis.   hydrochlorothiazide (HYDRODIURIL) 25 MG tablet Take 25 mg by mouth daily.   hyoscyamine (ANASPAZ) 0.125 MG TBDP disintergrating tablet Place 0.125 mg under the tongue every 6 (six) hours as needed for cramping.   IPRATROPIUM BROMIDE NA Place into the nose as needed.   lisinopril (PRINIVIL,ZESTRIL) 5 MG tablet Take 5 mg by mouth daily.   pantoprazole (PROTONIX) 40 MG tablet Take 40 mg by mouth daily as needed (acid reflux).    psyllium (METAMUCIL) 58.6 % powder Take 1 packet by mouth daily.   rivaroxaban (XARELTO) 10 MG TABS tablet Take 1 tablet (10 mg total) by mouth daily.   No facility-administered encounter medications on file as of 08/07/2021.    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, then one in the left leg, then one in the left kidney --all were onsearate occasions    Gallbladder disease    Heart murmur    Hypertension    Jaundice    as a child, her sister was diagnosed with it due to ingestion hepatitis, she later develped januce and thpugh was not testes herself , was presumed to also have the same time of hepatitis and was subsequesntly treated with globulin '   Left bundle branch block    "my pcp told me years ago i had it , 2 -3 years ago my obgyn heard a heart murmur and sent me to [cardiology] where they did a ultrasound of my heart , they didnt find anything definitive" , denies chest pain , sob, syncope    Osteoarthritis    Osteoporosis    Phlebitis    PONV (postoperative nausea and vomiting)    Past Surgical History:  Procedure Laterality Date   ABLATION  uterine   ?1995   CHOLECYSTECTOMY  over 10 years ago   REPLACEMENT TOTAL HIP W/  RESURFACING IMPLANTS     TOTAL HIP ARTHROPLASTY Left 2009   at     TOTAL HIP ARTHROPLASTY Right 09/20/2019   Procedure: TOTAL HIP ARTHROPLASTY ANTERIOR APPROACH;  Surgeon: Ollen Gross, MD;  Location: [facility];  Service: Orthopedics;  Laterality: Right;    TUBAL LIGATION  1984   Family History  Problem Relation Age of Onset   Hypertension Mother    Stroke Mother 34   Osteoporosis Mother    Heart disease Mother        in chronic a fib   Heart attack Father 74       fatal   Stroke Father 54   Diabetes Father        ?not diagnosed   Heart attack Sister    Diabetes Sister    Hypertension Sister    Kidney failure Sister    Atrial fibrillation Sister    Heart attack Sister 31   Heart disease Sister        enlarged heart   Hypertension Sister    Diabetes Sister    Heart attack Paternal Grandfather 77   Stroke Sister 57   Hypertension Sister    Atrial fibrillation Sister         multiple conversions   Diabetes Brother    Blindness Maternal Grandmother    Deafness Maternal Grandmother    Heart failure Maternal Grandmother 95   Hypertension Maternal Grandmother    Stomach cancer Maternal Grandfather    Hypertension Maternal Grandfather    Heart disease Paternal Uncle 58   Diabetes Paternal Uncle    Social History   Socioeconomic History   Marital status: Widowed    Spouse name: Not on file   Number of children: Not on file   Years of education: Not on file   Highest education level: Not on file  Occupational History   Not on file  Tobacco Use   Smoking status: Never   Smokeless tobacco: Never  Substance and Sexual Activity   Alcohol use: No   Drug use: No   Sexual activity: Not on file  Other Topics Concern   Not on file  Social History Narrative   Not on file   Social Determinants of Health   Financial Resource Strain: Not on file  Food Insecurity: Not on file  Transportation Needs: Not on file  Physical Activity: Not on file  Stress: Not on file  Social Connections: Not on file    Tobacco Counseling Counseling given: Not Answered   Clinical Intake:                 Diabetic?no         Activities of Daily Living No flowsheet data found.  Patient Care Team: Wynn Banker, MD as PCP - General (Family Medicine) Richardean Chimera, MD as Consulting Physician (Obstetrics and Gynecology) Kerin Salen, MD as Consulting Physician (Gastroenterology)  Indicate any recent Medical Services you may have received from other than [facility] providers in the past year (date may be approximate).     Assessment:   This is a routine wellness examination for Ann Long.  Hearing/Vision screen No results found.  Dietary issues and exercise activities discussed:     Goals Addressed   None    Depression Screen No flowsheet data found.  Fall Risk No flowsheet data found.  FALL RISK PREVENTION PERTAINING TO THE HOME:  Any stairs in  or around the home? No  If so, are there any without handrails? No  Home free of loose throw rugs in walkways, pet beds, electrical cords, etc? Yes  Adequate lighting in your home to reduce risk of falls? Yes   ASSISTIVE DEVICES UTILIZED  TO PREVENT FALLS:  Life alert? No  Use of a cane, walker or w/c? No  Grab bars in the bathroom? No  Shower chair or bench in shower? No  Elevated toilet seat or a handicapped toilet? No   TIMED UP AND GO:  Was the test performed? Yes .  Length of time to ambulate 10 feet: 10 sec.   Gait slow and steady without use of assistive device  Cognitive Function:  Normal cognitive status assessed by direct observation by this Nurse Health Advisor. No abnormalities found.        Immunizations Immunization History  Administered Date(s) Administered   Influenza, High Dose Seasonal PF 09/13/2017, 09/07/2018, 09/01/2019, 10/08/2020   Moderna Sars-Covid-2 Vaccination 02/24/2021   PFIZER(Purple Top)SARS-COV-2 Vaccination 12/22/2019, 01/22/2020   Pneumococcal Conjugate-13 12/29/2016   Pneumococcal Polysaccharide-23 07/20/2018   Td 07/20/2018   Zoster, Live 08/30/2012    TDAP status: Up to date  Flu Vaccine status: Up to date  Pneumococcal vaccine status: Up to date  Covid-19 vaccine status: Completed vaccines  Qualifies for Shingles Vaccine? Yes   Zostavax completed No   Shingrix Completed?: No.    Education has been provided regarding the importance of this vaccine. Patient has been advised to call insurance company to determine out of pocket expense if they have not yet received this vaccine. Advised may also receive vaccine at local pharmacy or Health Dept. Verbalized acceptance and understanding.  Screening Tests Health Maintenance  Topic Date Due   Zoster Vaccines- Shingrix (1 of 2) Never done   DEXA SCAN  Never done   MAMMOGRAM  03/24/2021   COVID-19 Vaccine (4 - Booster for Pfizer series) 06/26/2021   INFLUENZA VACCINE  07/21/2021    Hepatitis C Screening  03/24/2022 (Originally 11/13/1969)   TETANUS/TDAP  07/20/2028   COLONOSCOPY (Pts [id number] Insurance coverage will need to be confirmed)  03/24/2030   PNA vac Low Risk Adult  Completed   HPV VACCINES  Aged Out    Health Maintenance  Health Maintenance Due  Topic Date Due   Zoster Vaccines- Shingrix (1 of 2) Never done   DEXA SCAN  Never done   MAMMOGRAM  03/24/2021   COVID-19 Vaccine (4 - Booster for Pfizer series) 06/26/2021   INFLUENZA VACCINE  07/21/2021    Colorectal cancer screening: Type of screening: Colonoscopy. Completed 03/24/2020. Repeat every 10 years  Mammogram status: Completed scheduled today 08/07/2021  1145. Repeat every year  Bone Density status: Completed 03/24/2020 per pt ob office . Results reflect: Bone density results: OSTEOPOROSIS. Repeat every 2 years.  Lung Cancer Screening: (Low Dose CT Chest recommended if Age 65-80 years, 30 pack-year currently smoking OR have quit w/in 15years.) does not qualify.   Lung Cancer Screening Referral: n/a  Additional Screening:  Hepatitis C Screening: does qualify;   Vision Screening: Recommended annual ophthalmology exams for early detection of glaucoma and other disorders of the eye. Is the patient up to date with their annual eye exam?  Yes  Who is the provider or what is the name of the office in which the patient attends annual eye exams? Dr.Lyles If pt is not established with a provider, would they like to be referred to a provider to establish care? Yes .   Dental Screening: Recommended annual dental exams for proper oral hygiene  Community Resource Referral / Chronic Care Management: CRR required this visit?  No   CCM required this visit?  No      Plan:     I  have personally reviewed and noted the following in the patient's chart:   Medical and social history Use of alcohol, tobacco or illicit drugs  Current medications and supplements including opioid prescriptions. Patient  is not currently taking opioid prescriptions. Functional ability and status Nutritional status Physical activity Advanced directives List of other physicians Hospitalizations, surgeries, and ER visits in previous 12 months Vitals Screenings to include cognitive, depression, and falls Referrals and appointments  In addition, I have reviewed and discussed with patient certain preventive protocols, quality metrics, and best practice recommendations. A written personalized care plan for preventive services as well as general preventive health recommendations were provided to patient.     March Rummage, LPN   2/70/3500   Nurse Notes: none

## 2021-08-07 NOTE — Patient Instructions (Signed)
Ann Long , Thank you for taking time to come for your Medicare Wellness Visit. I appreciate your ongoing commitment to your health goals. Please review the following plan we discussed and let me know if I can assist you in the future.   Screening recommendations/referrals: Colonoscopy: 03/24/2020 2031 Mammogram: scheduled today 08/07/2021  1145 am Bone Density: 03/24/2020  Recommended yearly ophthalmology/optometry visit for glaucoma screening and checkup Recommended yearly dental visit for hygiene and checkup  Vaccinations: Influenza vaccine: due in fall 2022  Pneumococcal vaccine: completed series  Tdap vaccine: 07/20/2018 Shingles vaccine: will obtain local pharmacy     Advanced directives: none   Conditions/risks identified: none   Next appointment: 09/24/2021  1130am  Dr.Koberlein    Preventive Care 65 Years and Older, Female Preventive care refers to lifestyle choices and visits with your health care provider that can promote health and wellness. What does preventive care include? A yearly physical exam. This is also called an annual well check. Dental exams once or twice a year. Routine eye exams. Ask your health care provider how often you should have your eyes checked. Personal lifestyle choices, including: Daily care of your teeth and gums. Regular physical activity. Eating a healthy diet. Avoiding tobacco and drug use. Limiting alcohol use. Practicing safe sex. Taking low-dose aspirin every day. Taking vitamin and mineral supplements as recommended by your health care provider. What happens during an annual well check? The services and screenings done by your health care provider during your annual well check will depend on your age, overall health, lifestyle risk factors, and family history of disease. Counseling  Your health care provider may ask you questions about your: Alcohol use. Tobacco use. Drug use. Emotional well-being. Home and relationship  well-being. Sexual activity. Eating habits. History of falls. Memory and ability to understand (cognition). Work and work Astronomer. Reproductive health. Screening  You may have the following tests or measurements: Height, weight, and BMI. Blood pressure. Lipid and cholesterol levels. These may be checked every 5 years, or more frequently if you are over 19 years old. Skin check. Lung cancer screening. You may have this screening every year starting at age 59 if you have a 30-pack-year history of smoking and currently smoke or have quit within the past 15 years. Fecal occult blood test (FOBT) of the stool. You may have this test every year starting at age 13. Flexible sigmoidoscopy or colonoscopy. You may have a sigmoidoscopy every 5 years or a colonoscopy every 10 years starting at age 16. Hepatitis C blood test. Hepatitis B blood test. Sexually transmitted disease (STD) testing. Diabetes screening. This is done by checking your blood sugar (glucose) after you have not eaten for a while (fasting). You may have this done every 1-3 years. Bone density scan. This is done to screen for osteoporosis. You may have this done starting at age 90. Mammogram. This may be done every 1-2 years. Talk to your health care provider about how often you should have regular mammograms. Talk with your health care provider about your test results, treatment options, and if necessary, the need for more tests. Vaccines  Your health care provider may recommend certain vaccines, such as: Influenza vaccine. This is recommended every year. Tetanus, diphtheria, and acellular pertussis (Tdap, Td) vaccine. You may need a Td booster every 10 years. Zoster vaccine. You may need this after age 53. Pneumococcal 13-valent conjugate (PCV13) vaccine. One dose is recommended after age 79. Pneumococcal polysaccharide (PPSV23) vaccine. One dose is recommended after age  65. Talk to your health care provider about which  screenings and vaccines you need and how often you need them. This information is not intended to replace advice given to you by your health care provider. Make sure you discuss any questions you have with your health care provider. Document Released: 01/03/2016 Document Revised: 08/26/2016 Document Reviewed: 10/08/2015 Elsevier Interactive Patient Education  2017 [place] Prevention in the Home Falls can cause injuries. They can happen to people of all ages. There are many things you can do to make your home safe and to help prevent falls. What can I do on the outside of my home? Regularly fix the edges of walkways and driveways and fix any cracks. Remove anything that might make you trip as you walk through a door, such as a raised step or threshold. Trim any bushes or trees on the path to your home. Use bright outdoor lighting. Clear any walking paths of anything that might make someone trip, such as rocks or tools. Regularly check to see if handrails are loose or broken. Make sure that both sides of any steps have handrails. Any raised decks and porches should have guardrails on the edges. Have any leaves, snow, or ice cleared regularly. Use sand or salt on walking paths during winter. Clean up any spills in your garage right away. This includes oil or grease spills. What can I do in the bathroom? Use night lights. Install grab bars by the toilet and in the tub and shower. Do not use towel bars as grab bars. Use non-skid mats or decals in the tub or shower. If you need to sit down in the shower, use a plastic, non-slip stool. Keep the floor dry. Clean up any water that spills on the floor as soon as it happens. Remove soap buildup in the tub or shower regularly. Attach bath mats securely with double-sided non-slip rug tape. Do not have throw rugs and other things on the floor that can make you trip. What can I do in the bedroom? Use night lights. Make sure that you have a  light by your bed that is easy to reach. Do not use any sheets or blankets that are too big for your bed. They should not hang down onto the floor. Have a firm chair that has side arms. You can use this for support while you get dressed. Do not have throw rugs and other things on the floor that can make you trip. What can I do in the kitchen? Clean up any spills right away. Avoid walking on wet floors. Keep items that you use a lot in easy-to-reach places. If you need to reach something above you, use a strong step stool that has a grab bar. Keep electrical cords out of the way. Do not use floor polish or wax that makes floors slippery. If you must use wax, use non-skid floor wax. Do not have throw rugs and other things on the floor that can make you trip. What can I do with my stairs? Do not leave any items on the stairs. Make sure that there are handrails on both sides of the stairs and use them. Fix handrails that are broken or loose. Make sure that handrails are as long as the stairways. Check any carpeting to make sure that it is firmly attached to the stairs. Fix any carpet that is loose or worn. Avoid having throw rugs at the top or bottom of the stairs. If you do have  throw rugs, attach them to the floor with carpet tape. Make sure that you have a light switch at the top of the stairs and the bottom of the stairs. If you do not have them, ask someone to add them for you. What else can I do to help prevent falls? Wear shoes that: Do not have high heels. Have rubber bottoms. Are comfortable and fit you well. Are closed at the toe. Do not wear sandals. If you use a stepladder: Make sure that it is fully opened. Do not climb a closed stepladder. Make sure that both sides of the stepladder are locked into place. Ask someone to hold it for you, if possible. Clearly mark and make sure that you can see: Any grab bars or handrails. First and last steps. Where the edge of each step  is. Use tools that help you move around (mobility aids) if they are needed. These include: Canes. Walkers. Scooters. Crutches. Turn on the lights when you go into a dark area. Replace any light bulbs as soon as they burn out. Set up your furniture so you have a clear path. Avoid moving your furniture around. If any of your floors are uneven, fix them. If there are any pets around you, be aware of where they are. Review your medicines with your doctor. Some medicines can make you feel dizzy. This can increase your chance of falling. Ask your doctor what other things that you can do to help prevent falls. This information is not intended to replace advice given to you by your health care provider. Make sure you discuss any questions you have with your health care provider. Document Released: 10/03/2009 Document Revised: 05/14/2016 Document Reviewed: 01/11/2015 Elsevier Interactive Patient Education  2017 [employer].

## 2021-08-11 ENCOUNTER — Encounter: Payer: Self-pay | Admitting: Family Medicine

## 2021-09-24 ENCOUNTER — Other Ambulatory Visit: Payer: Self-pay

## 2021-09-24 ENCOUNTER — Ambulatory Visit (INDEPENDENT_AMBULATORY_CARE_PROVIDER_SITE_OTHER): Payer: Medicare Other | Admitting: Family Medicine

## 2021-09-24 ENCOUNTER — Other Ambulatory Visit: Payer: Self-pay | Admitting: Family Medicine

## 2021-09-24 ENCOUNTER — Encounter: Payer: Self-pay | Admitting: Family Medicine

## 2021-09-24 VITALS — BP 118/78 | HR 68 | Temp 98.0°F | Ht 67.0 in | Wt 240.5 lb

## 2021-09-24 DIAGNOSIS — Z7901 Long term (current) use of anticoagulants: Secondary | ICD-10-CM | POA: Diagnosis not present

## 2021-09-24 DIAGNOSIS — K219 Gastro-esophageal reflux disease without esophagitis: Secondary | ICD-10-CM

## 2021-09-24 DIAGNOSIS — E538 Deficiency of other specified B group vitamins: Secondary | ICD-10-CM

## 2021-09-24 DIAGNOSIS — I10 Essential (primary) hypertension: Secondary | ICD-10-CM

## 2021-09-24 DIAGNOSIS — Z8249 Family history of ischemic heart disease and other diseases of the circulatory system: Secondary | ICD-10-CM

## 2021-09-24 DIAGNOSIS — K146 Glossodynia: Secondary | ICD-10-CM

## 2021-09-24 DIAGNOSIS — M810 Age-related osteoporosis without current pathological fracture: Secondary | ICD-10-CM | POA: Diagnosis not present

## 2021-09-24 DIAGNOSIS — J302 Other seasonal allergic rhinitis: Secondary | ICD-10-CM

## 2021-09-24 DIAGNOSIS — Z23 Encounter for immunization: Secondary | ICD-10-CM

## 2021-09-24 LAB — COMPREHENSIVE METABOLIC PANEL
ALT: 11 U/L (ref 0–35)
AST: 16 U/L (ref 0–37)
Albumin: 4.3 g/dL (ref 3.5–5.2)
Alkaline Phosphatase: 55 U/L (ref 39–117)
BUN: 22 mg/dL (ref 6–23)
CO2: 25 mEq/L (ref 19–32)
Calcium: 9.5 mg/dL (ref 8.4–10.5)
Chloride: 102 mEq/L (ref 96–112)
Creatinine, Ser: 0.85 mg/dL (ref 0.40–1.20)
GFR: 69.69 mL/min (ref >60.00)
Glucose, Bld: 91 mg/dL (ref 70–99)
Potassium: 4 mEq/L (ref 3.5–5.1)
Sodium: 136 mEq/L (ref 135–145)
Total Bilirubin: 0.6 mg/dL (ref 0.2–1.2)
Total Protein: 7.5 g/dL (ref 6.0–8.3)

## 2021-09-24 LAB — CBC WITH DIFFERENTIAL/PLATELET
Basophils Absolute: 0 10*3/uL (ref 0.0–0.1)
Basophils Relative: 0.6 % (ref 0.0–3.0)
Eosinophils Absolute: 0.1 10*3/uL (ref 0.0–0.7)
Eosinophils Relative: 0.9 % (ref 0.0–5.0)
HCT: 40 % (ref 36.0–46.0)
Hemoglobin: 13.3 g/dL (ref 12.0–15.0)
Lymphocytes Relative: 32.1 % (ref 12.0–46.0)
Lymphs Abs: 2.2 10*3/uL (ref 0.7–4.0)
MCHC: 33.4 g/dL (ref 30.0–36.0)
MCV: 92.4 fl (ref 78.0–100.0)
Monocytes Absolute: 0.4 10*3/uL (ref 0.1–1.0)
Monocytes Relative: 5.8 % (ref 3.0–12.0)
Neutro Abs: 4.2 10*3/uL (ref 1.4–7.7)
Neutrophils Relative %: 60.6 % (ref 43.0–77.0)
Platelets: 323 10*3/uL (ref 150.0–400.0)
RBC: 4.32 Mil/uL (ref 3.87–5.11)
RDW: 13 % (ref 11.5–15.5)
WBC: 6.9 10*3/uL (ref 4.0–10.5)

## 2021-09-24 LAB — FOLATE: Folate: 17.2 ng/mL (ref >5.9)

## 2021-09-24 LAB — IBC + FERRITIN
Ferritin: 93.3 ng/mL (ref 10.0–291.0)
Iron: 96 ug/dL (ref 42–145)
Saturation Ratios: 32.7 % (ref 20.0–50.0)
TIBC: 294 ug/dL (ref 250.0–450.0)
Transferrin: 210 mg/dL — ABNORMAL LOW (ref 212.0–360.0)

## 2021-09-24 LAB — VITAMIN B12: Vitamin B-12: 153 pg/mL — ABNORMAL LOW (ref 211–911)

## 2021-09-24 LAB — TSH: TSH: 1.61 u[IU]/mL (ref 0.35–5.50)

## 2021-09-24 MED ORDER — HYDROCHLOROTHIAZIDE 25 MG PO TABS
25.0000 mg | ORAL_TABLET | Freq: Every day | ORAL | 3 refills | Status: DC
Start: 2021-09-24 — End: 2022-07-20

## 2021-09-24 MED ORDER — LIDOCAINE VISCOUS HCL 2 % MT SOLN: 5.0000 mL | Freq: Four times a day (QID) | OROMUCOSAL | 1 refills | Status: DC | PRN

## 2021-09-24 MED ORDER — RIVAROXABAN 10 MG PO TABS
10.0000 mg | ORAL_TABLET | Freq: Every day | ORAL | 3 refills | Status: DC
Start: 2021-09-24 — End: 2022-10-13

## 2021-09-24 MED ORDER — LISINOPRIL 5 MG PO TABS
5.0000 mg | ORAL_TABLET | Freq: Every day | ORAL | 3 refills | Status: DC
Start: 2021-09-24 — End: 2022-04-06

## 2021-09-24 MED ORDER — PANTOPRAZOLE SODIUM 40 MG PO TBEC
40.0000 mg | DELAYED_RELEASE_TABLET | Freq: Every day | ORAL | 3 refills | Status: DC | PRN
Start: 2021-09-24 — End: 2022-10-13

## 2021-09-24 NOTE — Progress Notes (Signed)
EVANEE LUBRANO DOB: 1951-02-24 Encounter date: 09/24/2021  This is a 70 y.o. female who presents with Chief Complaint  Patient presents with   Follow-up    History of present illness: Last visit with me was 03/24/2021 to establish care.  Has burning mouth syndrome - mouth burning, lips burning. Has had this in the past and was told uncertain cause. Was given magic mouthwash in the past which helped with sx. Has been 15 years since it last happened. Worse at night. Just burning/tingling sensation. Going on for over a month. Nothing new - no new toothpastes or mouthwashes. Uses crest sensitive. No new medications. Diet hasn't changed. Eating out a little more than before. No sores that she can see. She did go to urgent care because felt like it looked like thrush at one point. Was having pain shooting right side of face awhile back (jan/feb). Went to ENT and was evaluated. Has gone away, but if she pushes along side right of nose she will have some pain. Teeth even hurt on that side. Has tried to avoid aggravating foods like citrus and spicy. Hasn't noticed increase pain with certain foods.   Has brother, 2 sisters that have had mild heart attack, dad with heart attack. We had briefly discussed statin treatment consideration at last visit.   After completing blood work, we converted her from Coumadin to Xarelto. She has done ok with this.   Hypertension: Hydrochlorothiazide 25 mg daily, lisinopril 5mg  daily. Has been on these for years. Osteoporosis: Follows with gynecology regularly.  She is on Fosamax.  Supplements of 2000 units vitamin D daily. GERD: Protonix as needed 40mg . Doesn't use daily but has since she started having burning mouth symptoms. Otherwise wasn't using even monthly but when flares will use for a couple of weeks.  IBS: Follows with GI.  Hyoscyamine if needed. Seasonal allergies: Follows with Dr. .  Flonase, ipratropium as needed, Allegra.  Allergies  Allergen  Reactions   Doxycycline Diarrhea   Sulfa Antibiotics Rash   Current Meds  Medication Sig   alendronate (FOSAMAX) 70 MG tablet Take 70 mg by mouth once a week. Take with a full glass of water on an empty stomach.   amoxicillin (AMOXIL) 500 MG capsule Take 500 mg by mouth 2 (two) times daily. FOR DENTIST   cholecalciferol (VITAMIN D) 1000 units tablet Take 2,000 Units by mouth daily.   docusate sodium (COLACE) 100 MG capsule Take 100 mg by mouth daily.   Fexofenadine HCl (ALLEGRA PO) Take by mouth.   fluticasone (FLONASE) 50 MCG/ACT nasal spray Place 1 spray into both nostrils daily as needed for allergies or rhinitis.   hydrochlorothiazide (HYDRODIURIL) 25 MG tablet Take 25 mg by mouth daily.   hyoscyamine (ANASPAZ) 0.125 MG TBDP disintergrating tablet Place 0.125 mg under the tongue every 6 (six) hours as needed for cramping.   IPRATROPIUM BROMIDE NA Place into the nose as needed.   lisinopril (PRINIVIL,ZESTRIL) 5 MG tablet Take 5 mg by mouth daily.   magic mouthwash (lidocaine, diphenhydrAMINE, alum & mag hydroxide) suspension Swish and spit 5 mLs 4 (four) times daily as needed for mouth pain.   psyllium (METAMUCIL) 58.6 % powder Take 1 packet by mouth daily.   rivaroxaban (XARELTO) 10 MG TABS tablet Take 1 tablet (10 mg total) by mouth daily.   [DISCONTINUED] pantoprazole (PROTONIX) 40 MG tablet Take 40 mg by mouth daily as needed (acid reflux).     Review of Systems  Constitutional:  Negative for chills,  fatigue and fever.  HENT:  Positive for congestion (just started in the last couple of days) and postnasal drip (more recent in last couple of days). Negative for ear pain, sinus pressure, sinus pain, sore throat and trouble swallowing.        See hpi  Respiratory:  Negative for cough, chest tightness, shortness of breath and wheezing.   Cardiovascular:  Negative for chest pain, palpitations and leg swelling.  Gastrointestinal:  Negative for abdominal pain.       Increased reflux; has  restarted protonix   Objective:  BP 118/78 (BP Location: Left Arm, Patient Position: Sitting, Cuff Size: Large)   Pulse 68   Temp 98 F (36.7 C) (Oral)   Ht 5\' 7"  (1.702 m)   Wt 240 lb 8 oz (109.1 kg)   SpO2 98%   BMI 37.67 kg/m   Weight: 240 lb 8 oz (109.1 kg)   BP Readings from Last 3 Encounters:  09/24/21 118/78  08/07/21 122/70  03/24/21 118/80   Wt Readings from Last 3 Encounters:  09/24/21 240 lb 8 oz (109.1 kg)  08/07/21 240 lb (108.9 kg)  03/24/21 240 lb 12.8 oz (109.2 kg)    Physical Exam Constitutional:      General: She is not in acute distress.    Appearance: She is well-developed.  HENT:     Head: Normocephalic and atraumatic.     Comments: No obvious abnormalities on exam; there is appearance of chronic irritation inner lips.   Tonsils are large (chronic per patient 1+), partially biphid uvula; higher palate right oropharynx, some erythema/edema poropharynx R>L    Right Ear: Tympanic membrane, ear canal and external ear normal.     Left Ear: Tympanic membrane, ear canal and external ear normal.     Mouth/Throat:     Mouth: Mucous membranes are moist.     Pharynx: Posterior oropharyngeal erythema present. No oropharyngeal exudate.  Cardiovascular:     Rate and Rhythm: Normal rate and regular rhythm.     Heart sounds: Murmur heard.  Systolic murmur is present with a grade of 2/6.    No friction rub.  Pulmonary:     Effort: Pulmonary effort is normal. No respiratory distress.     Breath sounds: Normal breath sounds. No wheezing or rales.  Abdominal:     General: Abdomen is flat. Bowel sounds are normal.     Palpations: Abdomen is soft.     Tenderness: There is no abdominal tenderness.  Musculoskeletal:     Right lower leg: No edema.     Left lower leg: No edema.  Neurological:     Mental Status: She is alert and oriented to person, place, and time.  Psychiatric:        Behavior: Behavior normal.    Assessment/Plan  1. Hypertension, unspecified  type Well controlled. May be able to drop the lisinopril. Will determine pending labwork results.  - CBC with Differential/Platelet; Future - Comprehensive metabolic panel; Future  2. Gastroesophageal reflux disease, unspecified whether esophagitis present Continue with protonix.   3. Osteoporosis, unspecified osteoporosis type, unspecified pathological fracture presence Recheck vitamin D. Taking fosamax through gyn.   4. Chronic anticoagulation On xarelto. Tolerating well.   5. Seasonal allergies Continue with allergy medication. Suggest more regular use due to increased post nasal drainage.   6. Need for immunization against influenza - Flu Vaccine QUAD High Dose(Fluad)  7. Burning tongue syndrome Start with bloodwork; follow up pending these results. - magic mouthwash (lidocaine, diphenhydrAMINE,  alum & mag hydroxide) suspension; Swish and spit 5 mLs 4 (four) times daily as needed for mouth pain.  Dispense: 180 mL; Refill: 1 - TSH; Future - Vitamin B12; Future - Zinc; Future - Vitamin B6; Future - Folate; Future - IBC + Ferritin; Future  8. Family history of heart disease Start with NMR to better help with risk eval; consider coronary calcium scoring if results look favorable. She is agreeable to statin if results not favorable. - NMR, lipoprofile; Future   Return for pending bloodwork.     Theodis Shove, MD

## 2021-09-24 NOTE — Addendum Note (Signed)
Addended by: Kandra Nicolas on: 09/24/2021 01:59 PM   Modules accepted: Orders

## 2021-09-27 LAB — VITAMIN B6: Vitamin B6: 7.9 ng/mL (ref 2.1–21.7)

## 2021-09-27 LAB — ZINC: Zinc: 71 ug/dL (ref 60–130)

## 2021-09-29 NOTE — Addendum Note (Signed)
Addended by: Johnella Moloney on: 09/29/2021 11:58 AM   Modules accepted: Orders

## 2021-10-02 LAB — NMR, LIPOPROFILE

## 2021-10-10 ENCOUNTER — Other Ambulatory Visit: Payer: Self-pay | Admitting: Family Medicine

## 2021-10-10 DIAGNOSIS — Z13220 Encounter for screening for lipoid disorders: Secondary | ICD-10-CM

## 2021-10-10 DIAGNOSIS — E538 Deficiency of other specified B group vitamins: Secondary | ICD-10-CM

## 2021-10-20 NOTE — Addendum Note (Signed)
Addended by: Johnella Moloney on: 10/20/2021 11:57 AM   Modules accepted: Orders

## 2021-11-10 ENCOUNTER — Other Ambulatory Visit (INDEPENDENT_AMBULATORY_CARE_PROVIDER_SITE_OTHER): Payer: Medicare Other

## 2021-11-10 DIAGNOSIS — E538 Deficiency of other specified B group vitamins: Secondary | ICD-10-CM | POA: Diagnosis not present

## 2021-11-10 DIAGNOSIS — Z8249 Family history of ischemic heart disease and other diseases of the circulatory system: Secondary | ICD-10-CM

## 2021-11-10 LAB — VITAMIN B12: Vitamin B-12: 461 pg/mL (ref 211–911)

## 2021-11-10 LAB — FOLATE: Folate: 9.1 ng/mL (ref >5.9)

## 2021-11-11 LAB — NMR, LIPOPROFILE
Cholesterol, Total: 173 mg/dL (ref 100–199)
HDL Particle Number: 28.2 umol/L — ABNORMAL LOW (ref >=30.5)
HDL-C: 40 mg/dL (ref >39)
LDL Particle Number: 1257 nmol/L — ABNORMAL HIGH (ref <1000)
LDL Size: 20.5 nm — ABNORMAL LOW (ref >20.5)
LDL-C (NIH Calc): 107 mg/dL — ABNORMAL HIGH (ref 0–99)
LP-IR Score: 63 — ABNORMAL HIGH (ref <=45)
Small LDL Particle Number: 639 nmol/L — ABNORMAL HIGH (ref <=527)
Triglycerides: 147 mg/dL (ref 0–149)

## 2021-11-14 LAB — HOMOCYSTEINE: Homocysteine: 12.1 umol/L — ABNORMAL HIGH (ref <10.4)

## 2021-11-14 LAB — METHYLMALONIC ACID, SERUM: Methylmalonic Acid, Quant: 147 nmol/L (ref 87–318)

## 2021-11-19 ENCOUNTER — Telehealth: Payer: Self-pay | Admitting: Family Medicine

## 2021-11-19 NOTE — Telephone Encounter (Signed)
No, she should continue with hctz. Unless blood pressures are still running low? If she wants to tell me home readings, then I can advise better.

## 2021-11-19 NOTE — Telephone Encounter (Signed)
Pt seen dr Hassan Rowan in oct and was told per pt to stop taking lisinopril and she did however she is still taking HCTZ and is calling to see if she was suppose to stop that medication also

## 2021-11-19 NOTE — Telephone Encounter (Signed)
Patient informed of the message below and agreed to call back to leave a message with BP readings.

## 2021-11-20 ENCOUNTER — Telehealth: Payer: Self-pay | Admitting: Family Medicine

## 2021-11-20 NOTE — Telephone Encounter (Signed)
Pt is calling and she is getting ready to go buy folic acid and she would like to confirm she needs to get folic acid 1 mg

## 2021-11-20 NOTE — Telephone Encounter (Signed)
Spoke with the patient and advised per lab results, she is correct and should begin taking Folic acid 1mg  daily.

## 2021-11-22 ENCOUNTER — Other Ambulatory Visit: Payer: Self-pay | Admitting: Family Medicine

## 2021-11-24 ENCOUNTER — Other Ambulatory Visit: Payer: Self-pay | Admitting: Family Medicine

## 2021-12-05 ENCOUNTER — Other Ambulatory Visit: Payer: Self-pay | Admitting: Family Medicine

## 2021-12-05 MED ORDER — ROSUVASTATIN CALCIUM 5 MG PO TABS: ORAL_TABLET | ORAL | 3 refills | Status: DC

## 2022-03-27 ENCOUNTER — Ambulatory Visit: Payer: Medicare Other | Admitting: Family Medicine

## 2022-04-01 ENCOUNTER — Telehealth: Payer: Self-pay | Admitting: Family Medicine

## 2022-04-06 ENCOUNTER — Ambulatory Visit (INDEPENDENT_AMBULATORY_CARE_PROVIDER_SITE_OTHER): Payer: Medicare Other | Admitting: Family Medicine

## 2022-04-06 ENCOUNTER — Encounter: Payer: Self-pay | Admitting: Family Medicine

## 2022-04-06 VITALS — BP 112/82 | HR 65 | Temp 97.9°F | Ht 67.0 in | Wt 240.7 lb

## 2022-04-06 DIAGNOSIS — E538 Deficiency of other specified B group vitamins: Secondary | ICD-10-CM

## 2022-04-06 DIAGNOSIS — M810 Age-related osteoporosis without current pathological fracture: Secondary | ICD-10-CM

## 2022-04-06 DIAGNOSIS — I10 Essential (primary) hypertension: Secondary | ICD-10-CM | POA: Diagnosis not present

## 2022-04-06 DIAGNOSIS — Z7901 Long term (current) use of anticoagulants: Secondary | ICD-10-CM | POA: Diagnosis not present

## 2022-04-06 DIAGNOSIS — E7800 Pure hypercholesterolemia, unspecified: Secondary | ICD-10-CM | POA: Insufficient documentation

## 2022-04-06 DIAGNOSIS — E785 Hyperlipidemia, unspecified: Secondary | ICD-10-CM | POA: Diagnosis not present

## 2022-04-06 DIAGNOSIS — K219 Gastro-esophageal reflux disease without esophagitis: Secondary | ICD-10-CM

## 2022-04-06 DIAGNOSIS — D529 Folate deficiency anemia, unspecified: Secondary | ICD-10-CM

## 2022-04-06 DIAGNOSIS — B351 Tinea unguium: Secondary | ICD-10-CM

## 2022-04-06 DIAGNOSIS — Z1159 Encounter for screening for other viral diseases: Secondary | ICD-10-CM

## 2022-04-06 LAB — COMPREHENSIVE METABOLIC PANEL
ALT: 13 U/L (ref 0–35)
AST: 17 U/L (ref 0–37)
Albumin: 4.4 g/dL (ref 3.5–5.2)
Alkaline Phosphatase: 60 U/L (ref 39–117)
BUN: 18 mg/dL (ref 6–23)
CO2: 29 mEq/L (ref 19–32)
Calcium: 9.6 mg/dL (ref 8.4–10.5)
Chloride: 104 mEq/L (ref 96–112)
Creatinine, Ser: 0.93 mg/dL (ref 0.40–1.20)
GFR: 62.32 mL/min (ref >60.00)
Glucose, Bld: 99 mg/dL (ref 70–99)
Potassium: 4.1 mEq/L (ref 3.5–5.1)
Sodium: 140 mEq/L (ref 135–145)
Total Bilirubin: 0.5 mg/dL (ref 0.2–1.2)
Total Protein: 8 g/dL (ref 6.0–8.3)

## 2022-04-06 LAB — LIPID PANEL
Cholesterol: 144 mg/dL (ref 0–200)
HDL: 41.2 mg/dL (ref >39.00)
LDL Cholesterol: 73 mg/dL (ref 0–99)
NonHDL: 102.82
Total CHOL/HDL Ratio: 3
Triglycerides: 151 mg/dL — ABNORMAL HIGH (ref 0.0–149.0)
VLDL: 30.2 mg/dL (ref 0.0–40.0)

## 2022-04-06 LAB — CBC WITH DIFFERENTIAL/PLATELET
Basophils Absolute: 0 10*3/uL (ref 0.0–0.1)
Basophils Relative: 0.6 % (ref 0.0–3.0)
Eosinophils Absolute: 0.1 10*3/uL (ref 0.0–0.7)
Eosinophils Relative: 1.8 % (ref 0.0–5.0)
HCT: 41.9 % (ref 36.0–46.0)
Hemoglobin: 13.9 g/dL (ref 12.0–15.0)
Lymphocytes Relative: 33.2 % (ref 12.0–46.0)
Lymphs Abs: 2.1 10*3/uL (ref 0.7–4.0)
MCHC: 33.3 g/dL (ref 30.0–36.0)
MCV: 92.7 fl (ref 78.0–100.0)
Monocytes Absolute: 0.4 10*3/uL (ref 0.1–1.0)
Monocytes Relative: 6.2 % (ref 3.0–12.0)
Neutro Abs: 3.7 10*3/uL (ref 1.4–7.7)
Neutrophils Relative %: 58.2 % (ref 43.0–77.0)
Platelets: 332 10*3/uL (ref 150.0–400.0)
RBC: 4.52 Mil/uL (ref 3.87–5.11)
RDW: 13.2 % (ref 11.5–15.5)
WBC: 6.3 10*3/uL (ref 4.0–10.5)

## 2022-04-06 LAB — VITAMIN B12: Vitamin B-12: 534 pg/mL (ref 211–911)

## 2022-04-06 LAB — VITAMIN D 25 HYDROXY (VIT D DEFICIENCY, FRACTURES): VITD: 47.22 ng/mL (ref 30.00–100.00)

## 2022-04-06 LAB — FOLATE: Folate: >23.5 ng/mL (ref >5.9)

## 2022-04-06 MED ORDER — CICLOPIROX 8 % EX SOLN: Freq: Every day | CUTANEOUS | 5 refills | Status: DC

## 2022-04-06 NOTE — Progress Notes (Signed)
?Ann Long ?DOB: 02/04/1951 ?Encounter date: 04/06/2022 ? ?This is a 71 y.o. female who presents with ?Chief Complaint  ?Patient presents with  ? Follow-up  ? ? ?History of present illness: ?Last visit was 09/2021. She will be following with Dr. Ardyth Harps after I have moved.  ? ?Got second shingles shot last week. Had a little more reaction to that one.  ? ?HTN: brought home readings - ranging 107-118/63-74. Highest (outlier 1331/81). Home cuff read today was 124/70's. Usually uses right arm at home. Hctz 25 is only current med. Has stopped lisinopril.  ? ?Tongue is doing much better. Just occasional burning sensation.  ? ?GERD: doesn't have reflux all the time. Worse as stomach is empty - like morning. Takes protonix just if needed and then will take a week or so to get under control. Takes mylanta if needed. In a month will take protonix maybe not at all, sometimes up to twice. Sometimes just takes mylanta if needed and that does the trick.  ? ?Taking 2000 units vitamin D daily. Taking fosamax daily. ?Taking b12 daily. Was on calcium for years, but gyn stopped this. Then started vitamin D about 8-10 years ago. Fosamax started about 3 years ago.  ? ?Taking benefiber right now for constipation - just switched flavors - thinks orange triggered reflux. If takes regularly then she can stay on top of constipation. Sometimes will take fiber supplement BID. Will add prune juice. Takes colace daily as well. Takes the anaspaz on occasion for spasm - does get this on occasion and hits hard. Usually 30 day supply will last her at least 2-3 months. Takes if she is on trip to avoid accident or if going out to eat.  ? ?Takes allegra for allergies which controls sx well.  ? ? ?Wants to talk about toenail - has had what she believes is fungus for 3 years. No pain, discomfort. Iniitally fell off. But although starts growing normal, it doesn't grow out long and is thickened, discolored.  ? ? ?Allergies  ?Allergen  Reactions  ? Doxycycline Diarrhea  ? Sulfa Antibiotics Rash  ? ?Current Meds  ?Medication Sig  ? alendronate (FOSAMAX) 70 MG tablet Take 70 mg by mouth once a week. Take with a full glass of water on an empty stomach.  ? amoxicillin (AMOXIL) 500 MG capsule Take 500 mg by mouth 2 (two) times daily. FOR DENTIST  ? cholecalciferol (VITAMIN D) 1000 units tablet Take 2,000 Units by mouth daily.  ? ciclopirox (PENLAC) 8 % solution Apply topically at bedtime. Apply over nail and surrounding skin. Apply daily over previous coat. After seven (7) days, may remove with alcohol and continue cycle.  ? docusate sodium (COLACE) 100 MG capsule Take 100 mg by mouth daily.  ? Fexofenadine HCl (ALLEGRA PO) Take by mouth.  ? fluticasone (FLONASE) 50 MCG/ACT nasal spray Place 1 spray into both nostrils daily as needed for allergies or rhinitis.  ? hydrochlorothiazide (HYDRODIURIL) 25 MG tablet Take 1 tablet (25 mg total) by mouth daily.  ? hyoscyamine (ANASPAZ) 0.125 MG TBDP disintergrating tablet DISSOLVE 1 TABLET ON THE TONGUE THREE TIMES DAILY BEFORE MEALS AS NEEDED  ? IPRATROPIUM BROMIDE NA Place into the nose as needed.  ? magic mouthwash (lidocaine, diphenhydrAMINE, alum & mag hydroxide) suspension Swish and spit 5 mLs 4 (four) times daily as needed for mouth pain.  ? pantoprazole (PROTONIX) 40 MG tablet Take 1 tablet (40 mg total) by mouth daily as needed (acid reflux).  ? psyllium (  METAMUCIL) 58.6 % powder Take 1 packet by mouth daily.  ? rivaroxaban (XARELTO) 10 MG TABS tablet Take 1 tablet (10 mg total) by mouth daily.  ? rosuvastatin (CRESTOR) 5 MG tablet Take 1 tablet PO Monday, Weds, Friday  ? [DISCONTINUED] lisinopril (ZESTRIL) 5 MG tablet Take 1 tablet (5 mg total) by mouth daily.  ? ? ?Review of Systems  ?Constitutional:  Negative for chills, fatigue and fever.  ?Respiratory:  Negative for cough, chest tightness, shortness of breath and wheezing.   ?Cardiovascular:  Negative for chest pain, palpitations and leg swelling.   ? ?Objective: ? ?BP 112/82 (BP Location: Left Arm, Patient Position: Sitting, Cuff Size: Large)   Pulse 65   Temp 97.9 ?F (36.6 ?C) (Oral)   Ht 5\' 7"  (1.702 m)   Wt 240 lb 11.2 oz (109.2 kg)   SpO2 98%   BMI 37.70 kg/m?   Weight: 240 lb 11.2 oz (109.2 kg)  ? ?BP Readings from Last 3 Encounters:  ?04/06/22 112/82  ?09/24/21 118/78  ?08/07/21 122/70  ? ?Wt Readings from Last 3 Encounters:  ?04/06/22 240 lb 11.2 oz (109.2 kg)  ?09/24/21 240 lb 8 oz (109.1 kg)  ?08/07/21 240 lb (108.9 kg)  ? ? ?Physical Exam ?Constitutional:   ?   General: She is not in acute distress. ?   Appearance: She is well-developed.  ?Cardiovascular:  ?   Rate and Rhythm: Normal rate and regular rhythm.  ?   Heart sounds: Normal heart sounds. No murmur heard. ?  No friction rub.  ?Pulmonary:  ?   Effort: Pulmonary effort is normal. No respiratory distress.  ?   Breath sounds: Normal breath sounds. No wheezing or rales.  ?Musculoskeletal:  ?   Right lower leg: No edema.  ?   Left lower leg: No edema.  ?Skin: ?   Comments: Left great toenail thickened, yellow, layered. Some thickening 3rd toe left foot.  ?Neurological:  ?   Mental Status: She is alert and oriented to person, place, and time.  ?Psychiatric:     ?   Behavior: Behavior normal.  ? ? ?Assessment/Plan ?1. 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 ?2. Hypertension, unspecified type ?If your pressures are regularly below 115 systolic or diastolic is regularly less than 65 at home, then break the hctz in half ?- CBC with Differential/Platelet; Future ?- Comprehensive metabolic panel; Future ? ?3. Hyperlipidemia, unspecified hyperlipidemia type ?Tolerating Crestor 5 mg 3 days a week.  Recheck lipids today. ?- Lipid panel; Future ? ?4. B12 deficiency ?Currently taking replacement 1000 mcg daily.  Recheck levels. ?- Vitamin B12; Future ?- Folate; Future ? ?5. Gastroesophageal reflux disease, unspecified whether esophagitis present ?Uses Protonix rarely if  needed.  Generally well controlled. ? ?6. Osteoporosis, unspecified osteoporosis type, unspecified pathological fracture presence ?Has been on Fosamax now for few years.  She follows with gynecology.  She will get recheck of bone density this year. ?- VITAMIN D 25 Hydroxy (Vit-D Deficiency, Fractures); Future ? ?7. Encounter for hepatitis C screening test for low risk patient ?- Hepatitis C antibody; Future ? ?8. Anemia due to folic acid deficiency, unspecified deficiency type ?- Homocysteine; Future ? ?9. Onychomycosis ?She is elected to try topical treatment and see if this can help with improvement of toenail fungus.  Penlac was sent in for her.  Advised filing down nail to thin out surface and help with absorption of treatment.  She will consider if she wants to try oral treatment  pending failure of topical or would consider referral for podiatry and potential laser treatment. ? ? ?Return in about 6 months (around 10/06/2022) for physical exam. ? ? ? ? ?Theodis ShoveJunell Danial Sisley, MD ?

## 2022-04-08 LAB — HOMOCYSTEINE: Homocysteine: 9.9 umol/L (ref <10.4)

## 2022-04-08 LAB — HEPATITIS C ANTIBODY
Hepatitis C Ab: NONREACTIVE
SIGNAL TO CUT-OFF: 0.15 (ref <1.00)

## 2022-05-07 ENCOUNTER — Telehealth: Payer: Self-pay | Admitting: *Deleted

## 2022-05-07 NOTE — Telephone Encounter (Signed)
PCP received a fax from [facility] stating Ciclopirox has been recalled  and she advised the patient contact their pharmacy.  Spoke with the patient and informed her of this.  Patient stated she looked at her Mychart, questioned dosing for [id number], folic acid and was advised of the lab test results from 4/17 per PCP in which stated to take the supplement every other day.

## 2022-06-05 NOTE — Telephone Encounter (Signed)
error 

## 2022-07-14 LAB — HM DEXA SCAN

## 2022-07-20 ENCOUNTER — Other Ambulatory Visit: Payer: Self-pay | Admitting: *Deleted

## 2022-07-20 MED ORDER — HYDROCHLOROTHIAZIDE 25 MG PO TABS: 25.0000 mg | ORAL_TABLET | Freq: Every day | ORAL | 0 refills | Status: DC

## 2022-07-31 ENCOUNTER — Telehealth: Payer: Self-pay | Admitting: Family Medicine

## 2022-07-31 NOTE — Telephone Encounter (Signed)
Left message for patient to call back and schedule Medicare Annual Wellness Visit (AWV) either virtually or in office. Left  my jabber number [phone]   Last AWV 08/07/21   please schedule at anytime with [facility] Nurse Health Advisor 1 or 2     

## 2022-08-14 LAB — HM MAMMOGRAPHY

## 2022-09-29 ENCOUNTER — Ambulatory Visit (INDEPENDENT_AMBULATORY_CARE_PROVIDER_SITE_OTHER): Payer: Medicare Other | Admitting: Internal Medicine

## 2022-09-29 ENCOUNTER — Encounter: Payer: Self-pay | Admitting: Internal Medicine

## 2022-09-29 VITALS — BP 130/90 | HR 63 | Temp 97.9°F | Wt 242.4 lb

## 2022-09-29 DIAGNOSIS — E785 Hyperlipidemia, unspecified: Secondary | ICD-10-CM | POA: Diagnosis not present

## 2022-09-29 DIAGNOSIS — Z86718 Personal history of other venous thrombosis and embolism: Secondary | ICD-10-CM

## 2022-09-29 DIAGNOSIS — K146 Glossodynia: Secondary | ICD-10-CM

## 2022-09-29 DIAGNOSIS — Z1283 Encounter for screening for malignant neoplasm of skin: Secondary | ICD-10-CM

## 2022-09-29 DIAGNOSIS — I10 Essential (primary) hypertension: Secondary | ICD-10-CM | POA: Diagnosis not present

## 2022-09-29 DIAGNOSIS — Z23 Encounter for immunization: Secondary | ICD-10-CM | POA: Diagnosis not present

## 2022-09-29 DIAGNOSIS — Z7901 Long term (current) use of anticoagulants: Secondary | ICD-10-CM | POA: Diagnosis not present

## 2022-09-29 DIAGNOSIS — R011 Cardiac murmur, unspecified: Secondary | ICD-10-CM

## 2022-09-29 DIAGNOSIS — K219 Gastro-esophageal reflux disease without esophagitis: Secondary | ICD-10-CM

## 2022-09-29 NOTE — Progress Notes (Signed)
Established Patient Office Visit     CC/Reason for Visit: Establish care, discuss chronic medical conditions  HPI: Ann Long is a 71 y.o. female who is coming in today for the above mentioned reasons. Past Medical History is significant for: Hypertension, GERD, hyperlipidemia, osteoporosis, vitamin B12 deficiency.  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  She is having a burning tongue.  This was an issue last year but had improved.  She discussed with her dentist that did not find any abnormalities.  Requesting flu vaccine.   Past Medical/Surgical History: Past Medical History:  Diagnosis Date   Abnormal vaginal Pap smear    Arthritis    Deep vein thrombosis (DVT) (HCC)    reports they were sporadic , right leg  to vena cava, then one in the left leg, then one in the left kidney --all were onsearate occasions    Gallbladder disease    Heart murmur    Hypertension    Jaundice    as a child, her sister was diagnosed with it due to ingestion hepatitis, she later develped januce and thpugh was not testes herself , was presumed to also have the same time of hepatitis and was subsequesntly treated with globulin '   Left bundle branch block    "my pcp told me years ago i had it , 2 -3 years ago my obgyn heard a heart murmur and sent me to [cardiology] where they did a ultrasound of my heart , they didnt find anything definitive" , denies chest pain , sob, syncope    Osteoarthritis    Osteoporosis    Phlebitis    PONV (postoperative nausea and vomiting)     Past Surgical History:  Procedure Laterality Date   ABLATION  uterine   ?1995   CHOLECYSTECTOMY  over 10 years ago   REPLACEMENT TOTAL HIP W/  RESURFACING IMPLANTS     TOTAL HIP ARTHROPLASTY Left 2009   at [facility] Right 09/20/2019   Procedure: TOTAL HIP ARTHROPLASTY ANTERIOR APPROACH;  Surgeon: Gaynelle Arabian, MD;  Location: [facility];   Service: Orthopedics;  Laterality: Right;  147min   TUBAL LIGATION  1984    Social History:  reports that she has never smoked. She has never used smokeless tobacco. She reports that she does not drink alcohol and does not use drugs.  Allergies: Allergies  Allergen Reactions   Doxycycline Diarrhea   Sulfa Antibiotics Rash    Family History:  Family History  Problem Relation Age of Onset   Hypertension Mother    Stroke Mother 53   Osteoporosis Mother    Heart disease Mother        in chronic a fib   Heart attack Father 28       fatal   Stroke Father 46   Diabetes Father        ?not diagnosed   Heart attack Sister    Diabetes Sister    Hypertension Sister    Kidney failure Sister    Atrial fibrillation Sister    Heart attack Sister 76   Heart disease Sister        enlarged heart   Hypertension Sister    Diabetes Sister    Heart attack Paternal Grandfather 49   Stroke Sister 3   Hypertension Sister    Atrial fibrillation Sister        multiple conversions  Diabetes Brother    Blindness Maternal Grandmother    Deafness Maternal Grandmother    Heart failure Maternal Grandmother 95   Hypertension Maternal Grandmother    Stomach cancer Maternal Grandfather    Hypertension Maternal Grandfather    Heart disease Paternal Uncle 1   Diabetes Paternal Uncle      Current Outpatient Medications:    cholecalciferol (VITAMIN D) 1000 units tablet, Take 2,000 Units by mouth daily., Disp: , Rfl:    docusate sodium (COLACE) 100 MG capsule, Take 100 mg by mouth daily., Disp: , Rfl:    Fexofenadine HCl (ALLEGRA PO), Take by mouth., Disp: , Rfl:    fluticasone (FLONASE) 50 MCG/ACT nasal spray, Place 1 spray into both nostrils daily as needed for allergies or rhinitis., Disp: , Rfl:    hydrochlorothiazide (HYDRODIURIL) 25 MG tablet, Take 1 tablet (25 mg total) by mouth daily., Disp: 90 tablet, Rfl: 0   hyoscyamine (ANASPAZ) 0.125 MG TBDP disintergrating tablet, DISSOLVE 1 TABLET  ON THE TONGUE THREE TIMES DAILY BEFORE MEALS AS NEEDED, Disp: 270 tablet, Rfl: 1   magic mouthwash (lidocaine, diphenhydrAMINE, alum & mag hydroxide) suspension, Swish and spit 5 mLs 4 (four) times daily as needed for mouth pain., Disp: 180 mL, Rfl: 1   pantoprazole (PROTONIX) 40 MG tablet, Take 1 tablet (40 mg total) by mouth daily as needed (acid reflux)., Disp: 90 tablet, Rfl: 3   Probiotic Product (PROBIOTIC DAILY PO), Take by mouth., Disp: , Rfl:    psyllium (METAMUCIL) 58.6 % powder, Take 1 packet by mouth daily., Disp: , Rfl:    rivaroxaban (XARELTO) 10 MG TABS tablet, Take 1 tablet (10 mg total) by mouth daily., Disp: 90 tablet, Rfl: 3   rosuvastatin (CRESTOR) 5 MG tablet, Take 1 tablet PO Monday, Weds, Friday, Disp: 90 tablet, Rfl: 3   amoxicillin (AMOXIL) 500 MG capsule, Take 500 mg by mouth 2 (two) times daily. FOR DENTIST (Patient not taking: Reported on 09/29/2022), Disp: , Rfl:   Review of Systems:  Constitutional: Denies fever, chills, diaphoresis, appetite change and fatigue.  HEENT: Denies photophobia, eye pain, redness, hearing loss, ear pain, congestion, mouth sores, trouble swallowing, neck pain, neck stiffness and tinnitus.   Respiratory: Denies SOB, DOE, cough, chest tightness,  and wheezing.   Cardiovascular: Denies chest pain, palpitations and leg swelling.  Gastrointestinal: Denies nausea, vomiting, abdominal pain, diarrhea, constipation, blood in stool and abdominal distention.  Genitourinary: Denies dysuria, urgency, frequency, hematuria, flank pain and difficulty urinating.  Endocrine: Denies: hot or cold intolerance, sweats, changes in hair or nails, polyuria, polydipsia. Musculoskeletal: Denies myalgias, back pain, joint swelling, arthralgias and gait problem.  Skin: Denies pallor, rash and wound.  Neurological: Denies dizziness, seizures, syncope, weakness, light-headedness, numbness and headaches.  Hematological: Denies adenopathy. Easy bruising, personal or family  bleeding history  Psychiatric/Behavioral: Denies suicidal ideation, mood changes, confusion, nervousness, sleep disturbance and agitation    Physical Exam: Vitals:   09/29/22 0906 09/29/22 0938  BP: (!) 130/92 (!) 130/90  Pulse: 63   Temp: 97.9 F (36.6 C)   TempSrc: Oral   SpO2: 98%   Weight: 242 lb 6.4 oz (110 kg)     Body mass index is 37.97 kg/m.   Constitutional: NAD, calm, comfortable Eyes: PERRL, lids and conjunctivae normal ENMT: Mucous membranes are moist. Posterior pharynx is erythematous with clear tonsillar enlargement. Normal dentition.  Respiratory: clear to auscultation bilaterally, no wheezing, no crackles. Normal respiratory effort. No accessory muscle use.  Cardiovascular: Regular rate and rhythm, positive  systolic murmur no/ rubs / gallops. No extremity edema.  Psychiatric: Normal judgment and insight. Alert and oriented x 3. Normal mood.    Impression and Plan:  Hyperlipidemia, unspecified hyperlipidemia type  Hypertension, unspecified type  History of DVT (deep vein thrombosis)  Chronic anticoagulation  Gastroesophageal reflux disease, unspecified whether esophagitis present  Burning tongue  Need for influenza vaccination  Murmur  -She has had at least 4 distinct episodes of clotting and remains on lifelong anticoagulation with Xarelto which she is adherent to. -I wonder if her burning tone might be related to undertreated GERD.  She is only taking pantoprazole as needed for indigestion, have advised that she go back to taking daily.  Have also recommended Biotene mouth rinse which might be helpful to her. -Flu vaccine administered today. -Blood pressure is elevated on 2 separate measurements today.  She is only on hydrochlorothiazide 25 mg.  Have advised home blood pressure monitoring and return in 3 months for follow-up.  May need to increase antihypertensive regimen pending results. -I will send to dermatology for skin cancer screening. -She  had mammogram and bone density at [facility], will request records. -I am documenting a murmur today.  She had an echocardiogram in 2017 that did not show valvular abnormalities, she did have some grade 1 diastolic dysfunction and ventricular asynchrony thought related to left bundle branch block.  Reorder echo today.  Time spent:33 minutes reviewing chart, interviewing and examining patient and formulating plan of care.      Lelon Frohlich, MD [facility]

## 2022-09-30 ENCOUNTER — Telehealth (HOSPITAL_BASED_OUTPATIENT_CLINIC_OR_DEPARTMENT_OTHER): Payer: Self-pay | Admitting: *Deleted

## 2022-09-30 NOTE — Telephone Encounter (Signed)
Left message for patient to call and discuss scheduling the Echocardiogram ordered by Dr. Hernandez 

## 2022-10-13 ENCOUNTER — Other Ambulatory Visit: Payer: Self-pay

## 2022-10-13 MED ORDER — RIVAROXABAN 10 MG PO TABS: 10.0000 mg | ORAL_TABLET | Freq: Every day | ORAL | 1 refills | Status: DC

## 2022-10-13 MED ORDER — PANTOPRAZOLE SODIUM 40 MG PO TBEC: 40.0000 mg | DELAYED_RELEASE_TABLET | Freq: Every day | ORAL | 1 refills | Status: DC | PRN

## 2022-10-13 MED ORDER — HYOSCYAMINE SULFATE 0.125 MG PO TBDP: ORAL_TABLET | ORAL | 1 refills | Status: AC

## 2022-10-21 ENCOUNTER — Ambulatory Visit (INDEPENDENT_AMBULATORY_CARE_PROVIDER_SITE_OTHER): Payer: Medicare Other

## 2022-10-21 DIAGNOSIS — R011 Cardiac murmur, unspecified: Secondary | ICD-10-CM | POA: Diagnosis not present

## 2022-10-21 LAB — ECHOCARDIOGRAM COMPLETE
Area-P 1/2: 3.27 cm2
MV M vel: 3.62 m/s
MV Peak grad: 52.4 mmHg
S' Lateral: 3.6 cm

## 2022-10-22 ENCOUNTER — Other Ambulatory Visit: Payer: Self-pay | Admitting: Internal Medicine

## 2022-10-22 ENCOUNTER — Telehealth: Payer: Self-pay | Admitting: Internal Medicine

## 2022-10-22 DIAGNOSIS — I5020 Unspecified systolic (congestive) heart failure: Secondary | ICD-10-CM

## 2022-10-22 NOTE — Telephone Encounter (Signed)
Patient states she is returning a call from [place]

## 2022-10-22 NOTE — Telephone Encounter (Signed)
See result note.  

## 2022-11-03 ENCOUNTER — Ambulatory Visit: Payer: Medicare Other | Attending: Interventional Cardiology | Admitting: Cardiovascular Disease

## 2022-11-03 ENCOUNTER — Encounter: Payer: Self-pay | Admitting: Cardiovascular Disease

## 2022-11-03 VITALS — BP 134/86 | HR 66 | Ht 67.0 in | Wt 239.0 lb

## 2022-11-03 DIAGNOSIS — I5042 Chronic combined systolic (congestive) and diastolic (congestive) heart failure: Secondary | ICD-10-CM

## 2022-11-03 DIAGNOSIS — R0602 Shortness of breath: Secondary | ICD-10-CM | POA: Diagnosis not present

## 2022-11-03 DIAGNOSIS — I447 Left bundle-branch block, unspecified: Secondary | ICD-10-CM

## 2022-11-03 DIAGNOSIS — R072 Precordial pain: Secondary | ICD-10-CM

## 2022-11-03 DIAGNOSIS — E785 Hyperlipidemia, unspecified: Secondary | ICD-10-CM

## 2022-11-03 DIAGNOSIS — I82409 Acute embolism and thrombosis of unspecified deep veins of unspecified lower extremity: Secondary | ICD-10-CM

## 2022-11-03 LAB — BASIC METABOLIC PANEL
BUN/Creatinine Ratio: 18 (ref 12–28)
BUN: 16 mg/dL (ref 8–27)
CO2: 25 mmol/L (ref 20–29)
Calcium: 9.8 mg/dL (ref 8.7–10.3)
Chloride: 102 mmol/L (ref 96–106)
Creatinine, Ser: 0.91 mg/dL (ref 0.57–1.00)
Glucose: 92 mg/dL (ref 70–99)
Potassium: 4 mmol/L (ref 3.5–5.2)
Sodium: 139 mmol/L (ref 134–144)
eGFR: 68 mL/min/{1.73_m2} (ref >59)

## 2022-11-03 MED ORDER — CARVEDILOL 3.125 MG PO TABS: 3.1250 mg | ORAL_TABLET | Freq: Two times a day (BID) | ORAL | 3 refills | Status: DC

## 2022-11-03 MED ORDER — METOPROLOL TARTRATE 50 MG PO TABS: ORAL_TABLET | ORAL | 0 refills | Status: DC

## 2022-11-03 NOTE — Progress Notes (Signed)
Cardiology Office Note:    Date:  11/04/2022   ID:  Ann Long, DOB 1951-05-03, MRN [id number]  PCP:  Isaac Bliss, Rayford Halsted, MD   [facility] Providers Cardiologist:  None     Referring MD: Isaac Bliss, [place]*   No chief complaint on file. Ann Long is a 71 y.o. female who is being seen today for the evaluation of cardiomyopathy at the request of Isaac Bliss, [place] [facility]*.   History of Present Illness:    Ann Long is a 71 y.o. 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  Previous echo in 2017 showed EF 50-55%, same abnormal septal motion that was interpreted as septal wall motion abnormality on the current echo. GLS, PAP and E/e' are all within normal range. On my review, the reported EF is an underestimation. I think EF is approximately 45% (automated 3D EF was 44%).  There are minor valve abnormalities and mild atrial dilation.  She believes that she has had a left bundle branch block for over 20 years.  The oldest ECG to have access to is from 2017.  She initially reports that she is asymptomatic, but on detailed interrogation she does have NYHA functional class II dyspnea on exertion.  She gets short of breath if she has to climb a flight of stairs, sometimes has to stop at the intermediate landing.  She has been blaming this on her weight.  She does complain of diminished stamina.  She does not have any edema, orthopnea, PND.  She has never had issues with chest pain, but she does recall that during more intense physical activity she had some pain in her right shoulder blade that resolved fairly promptly with rest.  She does not have palpitations, dizziness syncope or neurological complaints and does not have intermittent claudication.  On current therapy she has a good lipid profile.  LDL was 73 earlier this year.  She does not have diabetes mellitus.   She is not a smoker.  Her dad died of a massive heart attack at age 47 and there is a strong history of diabetes mellitus on that side of the family.  She has a brother with diabetes mellitus who has had a stent in his 32s.  She has 2 sisters with atrial fibrillation.  Past Medical History:  Diagnosis Date   Abnormal vaginal Pap smear    Arthritis    Deep vein thrombosis (DVT) (HCC)    reports they were sporadic , right leg  to vena cava, then one in the left leg, then one in the left kidney --all were onsearate occasions    Gallbladder disease    Heart murmur    Hypertension    Jaundice    as a child, her sister was diagnosed with it due to ingestion hepatitis, she later develped januce and thpugh was not testes herself , was presumed to also have the same time of hepatitis and was subsequesntly treated with globulin '   Left bundle branch block    "my pcp told me years ago i had it , 2 -3 years ago my obgyn heard a heart murmur and sent me to [cardiology] where they did a ultrasound of my heart , they didnt find anything definitive" , denies chest pain , sob, syncope    Osteoarthritis    Osteoporosis    Phlebitis    PONV (postoperative nausea  and vomiting)     Past Surgical History:  Procedure Laterality Date   ABLATION  uterine   ?1995   CHOLECYSTECTOMY  over 10 years ago   REPLACEMENT TOTAL HIP W/  RESURFACING IMPLANTS     TOTAL HIP ARTHROPLASTY Left 2009   at [facility]    TOTAL HIP ARTHROPLASTY Right 09/20/2019   Procedure: TOTAL HIP ARTHROPLASTY ANTERIOR APPROACH;  Surgeon: Ollen Gross, MD;  Location: [facility];  Service: Orthopedics;  Laterality: Right;    TUBAL LIGATION  1984    Current Medications: Current Meds  Medication Sig   carvedilol (COREG) 3.125 MG tablet Take 1 tablet (3.125 mg total) by mouth 2 (two) times daily.   cholecalciferol (VITAMIN D) 1000 units tablet Take 2,000 Units by mouth daily.   Cyanocobalamin (VITAMIN B 12 PO) Take 1,000 mcg by mouth  daily in the afternoon.   docusate sodium (COLACE) 100 MG capsule Take 100 mg by mouth daily.   Fexofenadine HCl (ALLEGRA PO) Take 10 mg by mouth daily in the afternoon.   folic acid (FOLVITE) 1 MG tablet Take 1 mg by mouth every other day.   hydrochlorothiazide (HYDRODIURIL) 25 MG tablet Take 1 tablet (25 mg total) by mouth daily.   metoprolol tartrate (LOPRESSOR) 50 MG tablet Take 50 mg 2 hours before coronary ct   pantoprazole (PROTONIX) 40 MG tablet Take 1 tablet (40 mg total) by mouth daily as needed (acid reflux).   Probiotic Product (PROBIOTIC DAILY PO) Take 1 capsule by mouth daily in the afternoon.   psyllium (METAMUCIL) 58.6 % powder Take 1 packet by mouth daily.   rivaroxaban (XARELTO) 10 MG TABS tablet Take 1 tablet (10 mg total) by mouth daily.   rosuvastatin (CRESTOR) 5 MG tablet Take 1 tablet PO Monday, Weds, Friday     Allergies:   Doxycycline and Sulfa antibiotics   Social History   Socioeconomic History   Marital status: Widowed    Spouse name: Not on file   Number of children: Not on file   Years of education: Not on file   Highest education level: Not on file  Occupational History   Not on file  Tobacco Use   Smoking status: Never   Smokeless tobacco: Never  Substance and Sexual Activity   Alcohol use: No   Drug use: No   Sexual activity: Not on file  Other Topics Concern   Not on file  Social History Narrative   Not on file   Social Determinants of Health   Financial Resource Strain: Low Risk  (08/07/2021)   Overall Financial Resource Strain (CARDIA)    Difficulty of Paying Living Expenses: Not hard at all  Food Insecurity: No Food Insecurity (08/07/2021)   Hunger Vital Sign    Worried About Running Out of Food in the Last Year: Never true    Ran Out of Food in the Last Year: Never true  Transportation Needs: No Transportation Needs (08/07/2021)   PRAPARE - Administrator, Civil Service (Medical): No    Lack of Transportation  (Non-Medical): No  Physical Activity: Inactive (08/07/2021)   Exercise Vital Sign    Days of Exercise per Week: 0 days    Minutes of Exercise per Session: 0 min  Stress: No Stress Concern Present (08/07/2021)   [employer] of Occupational Health - Occupational Stress Questionnaire    Feeling of Stress : Not at all  Social Connections: Moderately Isolated (08/07/2021)   Social Connection and Isolation Panel [NHANES]  Frequency of Communication with Friends and Family: Three times a week    Frequency of Social Gatherings with Friends and Family: Three times a week    Attends Religious Services: More than 4 times per year    Active Member of Clubs or Organizations: No    Attends Archivist Meetings: Never    Marital Status: Widowed     Family History: The patient's family history includes Atrial fibrillation in her sister and sister; Blindness in her maternal grandmother; Deafness in her maternal grandmother; Diabetes in her brother, father, paternal uncle, sister, and sister; Heart attack in her sister; Heart attack (age of onset: 34) in her paternal grandfather; Heart attack (age of onset: 35) in her father; Heart attack (age of onset: 63) in her sister; Heart disease in her mother and sister; Heart disease (age of onset: 32) in her paternal uncle; Heart failure (age of onset: 19) in her maternal grandmother; Hypertension in her maternal grandfather, maternal grandmother, mother, sister, sister, and sister; Kidney failure in her sister; Osteoporosis in her mother; Stomach cancer in her maternal grandfather; Stroke (age of onset: 51) in her father; Stroke (age of onset: 60) in her sister; Stroke (age of onset: 39) in her mother.  ROS:   Please see the history of present illness.     All other systems reviewed and are negative.  EKGs/Labs/Other Studies Reviewed:    The following studies were reviewed today: ECHO 10/21/2022    1. Mild global hypokinesis worse in the  anteroseptum and inferoseptum.  Left ventricular ejection fraction, by estimation, is 35 to 40%. The left  ventricle has moderately decreased function. The left ventricle  demonstrates regional wall motion  abnormalities (see scoring diagram/findings for description). There is  mild concentric left ventricular hypertrophy. Left ventricular diastolic  parameters are consistent with Grade I diastolic dysfunction (impaired  relaxation). The average left  ventricular global longitudinal strain is -18.7 %. The global longitudinal  strain is normal.   2. Right ventricular systolic function is normal. The right ventricular  size is normal. There is normal pulmonary artery systolic pressure.   3. The mitral valve is normal in structure. Trivial mitral valve  regurgitation. No evidence of mitral stenosis.   4. The aortic valve is tricuspid. Aortic valve regurgitation is not  visualized. No aortic stenosis is present.   5. The inferior vena cava is normal in size with greater than 50%  respiratory variability, suggesting right atrial pressure of 3 mmHg.   EKG:  EKG is  ordered today.  The ekg ordered today demonstrates normal sinus rhythm, left bundle branch block (QRS 152 ms), left axis deviation, QTc 459 ms.  LBBB was seen on ECGs as far back as 2017  Recent Labs: 04/06/2022: ALT 13; Hemoglobin 13.9; Platelets 332.0 11/03/2022: BUN 16; Creatinine, Ser 0.91; Potassium 4.0; Sodium 139  Recent Lipid Panel    Component Value Date/Time   CHOL 144 04/06/2022 1031   TRIG 151.0 (H) 04/06/2022 1031   HDL 41.20 04/06/2022 1031   CHOLHDL 3 04/06/2022 1031   VLDL 30.2 04/06/2022 1031   LDLCALC 73 04/06/2022 1031   LDLDIRECT 110.0 03/24/2021 1101     Risk Assessment/Calculations:                Physical Exam:    VS:  BP 134/86 (BP Location: Left Arm, Patient Position: Sitting, Cuff Size: Large)   Pulse 66   Ht 5\' 7"  (1.702 m)   Wt 108.4 kg   SpO2  98%   BMI 37.43 kg/m     Wt Readings  from Last 3 Encounters:  11/03/22 108.4 kg  09/29/22 110 kg  04/06/22 109.2 kg     GEN: Severely obese, well nourished, well developed in no acute distress HEENT: Normal NECK: No JVD; No carotid bruits LYMPHATICS: No lymphadenopathy CARDIAC: Regular rate and rhythm, normal S1, paradoxically split S2, short aortic ejection murmur 1/6 early peaking, faint holosystolic murmur in the precordial area, no diastolic murmurs, rubs, gallops RESPIRATORY:  Clear to auscultation without rales, wheezing or rhonchi  ABDOMEN: Soft, non-tender, non-distended MUSCULOSKELETAL:  No edema; No deformity  SKIN: Warm and dry NEUROLOGIC:  Alert and oriented x 3 PSYCHIATRIC:  Normal affect   ASSESSMENT:    1. Chronic combined systolic (congestive) and diastolic (congestive) heart failure ([place])   2. Precordial chest pain   3. SOB (shortness of breath)   4. LBBB (left bundle branch block)   5. Recurrent deep venous thrombosis (HCC)   6. Severe obesity (BMI 35.0-39.9) with comorbidity ([place])   7. Dyslipidemia (high LDL; low HDL)    PLAN:    In order of problems listed above:  Chest pain: Need to exclude multivessel CAD.  She has had at least 1 episode of exertional chest discomfort that is concerning for angina pectoris, albeit stable angina.  Schedule her for coronary CT angiogram.  Findings will inform how aggressive we need to be for lipid-lowering.  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 NYHA functional class II.  No overt signs of hypervolemia today.  On my evaluation EF is closer to 45%, not as bad as recorded.  We discussed the complex treatment of heart failure with reduced ejection fraction that includes ARNI/beta-blocker/spironolactone/SGLT2 inhibitor.  Since her presentation is not acute, we will start these gradually and stepwise.  We will hold off starting ARNI and spironolactone until  after she has the contrast for her CT.  Start beta-blockers today since these will help [place] achieve heart rate less than 65 for good quality angiogram.  Warned her that beta-blocker initiation often leads to a transient worsening of fatigue or dyspnea.  Next  start the SGLT2 inhibitor.  Reevaluate LV systolic function after we were down titrating her medications.  Loop diuretics do not appear necessary at this time.   LBBB: Typical LBBB with QRS greater than 150 ms.  Future option for CRT-P if her symptoms should worsen and the EF drops to less than 35%. Recurrent DVT: Has had at least 4 independent episodes of DVT of which only 1 was associated with an orthopedic procedure (total hip replacement 2021), others were spontaneous.  Plan lifelong anticoagulation.  She reports a previous hypercoagulable work-up by hematologist was unrevealing. HLP: LDL cholesterol is not bad on current regimen, but if we identify vascular disease would shoot for an LDL less than 70, we should be able to achieve by doubling her rosuvastatin dose.  Note borderline elevated triglycerides and borderline low HDL cholesterol, signs of insulin resistance/metabolic syndrome.  Weight loss would be beneficial. Obesity: Recurrent DVT.  Borderline hypertriglyceridemia borderline low HDL.  She does not have daytime hypersomnolence.  She does not have diabetes mellitus.  Weight loss would be beneficial for her long-term prognosis and to improve her symptoms.           Medication Adjustments/Labs and Tests Ordered: Current medicines are reviewed at length with the patient today.  Concerns  regarding medicines are outlined above.  Orders Placed This Encounter  Procedures   CT CORONARY MORPH W/CTA COR W/SCORE W/CA W/CM &/OR WO/CM   Basic metabolic panel   EKG 12-Lead   Meds ordered this encounter  Medications   carvedilol (COREG) 3.125 MG tablet    Sig: Take 1 tablet (3.125 mg total) by mouth 2 (two) times daily.    Dispense:  180  tablet    Refill:  3   metoprolol tartrate (LOPRESSOR) 50 MG tablet    Sig: Take 50 mg 2 hours before coronary ct    Dispense:  1 tablet    Refill:  0    Patient Instructions  Medication Instructions:  Start Carvedilol 3.125 mg twice a day Continue all other medications *If you need a refill on your cardiac medications before your next appointment, please call your pharmacy*   Lab Work: Bmet today   Testing/Procedures: Coronary CT  will be scheduled after approved by insurance   Follow instructions below   Follow-Up: At [facility], you and your health needs are our priority.  As part of our continuing mission to provide you with exceptional heart care, we have created designated Provider Care Teams.  These Care Teams include your primary Cardiologist (physician) and Advanced Practice Providers (APPs -  Physician Assistants and Nurse Practitioners) who all work together to provide you with the care you need, when you need it.  We recommend signing up for the patient portal called "MyChart".  Sign up information is provided on this After Visit Summary.  MyChart is used to connect with patients for Virtual Visits (Telemedicine).  Patients are able to view lab/test results, encounter notes, upcoming appointments, etc.  Non-urgent messages can be sent to your provider as well.   To learn more about what you can do with MyChart, go to [email or web address].    Your next appointment:  After test    The format for your next appointment: Office    Provider:  Dr.Hedy Garro's PA      Your cardiac CT will be scheduled at one of the below locations:   [facility] [address] [place], [place] [address] [phone]  OR  [facility] [address] [place], [place] [address] [phone]  OR   [facility] [address] [place], [place] [address] [phone]  If scheduled  at [facility], please arrive at the [facility] and Children's Entrance (Entrance C2) of [facility] 30 minutes prior to test start time. You can use the FREE valet parking offered at entrance C (encouraged to control the heart rate for the test)  Proceed to the [facility] Radiology Department (first floor) to check-in and test prep.  All radiology patients and guests should use entrance C2 at [facility], accessed from [facility], even though the hospital's physical address listed is [address].    If scheduled at [facility] or [facility], please arrive 15 mins early for check-in and test prep.   Please follow these instructions carefully (unless otherwise directed):    On the Night Before the Test: Be sure to Drink plenty of water. Do not consume any caffeinated/decaffeinated beverages or chocolate 12 hours prior to your test. Do not take any antihistamines 12 hours prior to your test.   On the Day of the Test: Drink plenty of water until 1 hour prior to the test.  Do not eat any food 1 hour prior to test. You may take your regular medications prior to the test.  Take metoprolol 50 mg two hours prior to test. HOLD Hydrochlorothiazide morning of the test. Hold Carvedilol morning of test.  FEMALES- please wear underwire-free bra if available, avoid dresses & tight clothing       After the Test: Drink plenty of water. After receiving IV contrast, you may experience a mild flushed feeling. This is normal. On occasion, you may experience a mild rash up to 24 hours after the test. This is not dangerous. If this occurs, you can take Benadryl 25 mg and increase your fluid intake. If you experience trouble breathing, this can be serious. If it is severe call 911 IMMEDIATELY. If it is mild, please call our office.   We will call to schedule your test 2-4 weeks out understanding that some  insurance companies will need an authorization prior to the service being performed.   For non-scheduling related questions, please contact the cardiac imaging nurse navigator should you have any questions/concerns: Marchia Bond, Cardiac Imaging Nurse Navigator Gordy Clement, Cardiac Imaging Nurse Navigator  Heart and Vascular Services Direct Office Dial: [phone]   For scheduling needs, including cancellations and rescheduling, please call [place], [phone].     Important Information About [place]         Signed, Sanda Klein, MD  11/04/2022 2:34 PM    [place]

## 2022-11-03 NOTE — Patient Instructions (Addendum)
Medication Instructions:  Start Carvedilol 3.125 mg twice a day Continue all other medications *If you need a refill on your cardiac medications before your next appointment, please call your pharmacy*   Lab Work: Bmet today   Testing/Procedures: Coronary CT  will be scheduled after approved by insurance   Follow instructions below   Follow-Up: At [facility], you and your health needs are our priority.  As part of our continuing mission to provide you with exceptional heart care, we have created designated Provider Care Teams.  These Care Teams include your primary Cardiologist (physician) and Advanced Practice Providers (APPs -  Physician Assistants and Nurse Practitioners) who all work together to provide you with the care you need, when you need it.  We recommend signing up for the patient portal called "MyChart".  Sign up information is provided on this After Visit Summary.  MyChart is used to connect with patients for Virtual Visits (Telemedicine).  Patients are able to view lab/test results, encounter notes, upcoming appointments, etc.  Non-urgent messages can be sent to your provider as well.   To learn more about what you can do with MyChart, go to [email or web address].    Your next appointment:  After test    The format for your next appointment: Office    Provider:  Dr.Croitoru's PA      Your cardiac CT will be scheduled at one of the below locations:   [facility] [address] [place], [place] [address] [phone]  OR  [facility] [address] [place], [place] [address] [phone]  OR   [facility] [address] [place], [place] [address] [phone]  If scheduled at [facility], please arrive at the [facility] and Children's Entrance (Entrance C2) of [facility] 30 minutes prior to test start time. You can use the FREE valet parking  offered at entrance C (encouraged to control the heart rate for the test)  Proceed to the [facility] Radiology Department (first floor) to check-in and test prep.  All radiology patients and guests should use entrance C2 at [facility], accessed from [facility], even though the hospital's physical address listed is [address]    If scheduled at [facility] or [facility], please arrive 15 mins early for check-in and test prep.   Please follow these instructions carefully (unless otherwise directed):    On the Night Before the Test: Be sure to Drink plenty of water. Do not consume any caffeinated/decaffeinated beverages or chocolate 12 hours prior to your test. Do not take any antihistamines 12 hours prior to your test.   On the Day of the Test: Drink plenty of water until 1 hour prior to the test. Do not eat any food 1 hour prior to test. You may take your regular medications prior to the test.  Take metoprolol 50 mg two hours prior to test. HOLD Hydrochlorothiazide morning of the test. Hold Carvedilol morning of test.  FEMALES- please wear underwire-free bra if available, avoid dresses & tight clothing       After the Test: Drink plenty of water. After receiving IV contrast, you may experience a mild flushed feeling. This is normal. On occasion, you may experience a mild rash up to 24 hours after the test. This is not dangerous. If this occurs, you can take Benadryl 25 mg and increase your fluid intake. If you experience  trouble breathing, this can be serious. If it is severe call 911 IMMEDIATELY. If it is mild, please call our office.   We will call to schedule your test 2-4 weeks out understanding that some insurance companies will need an authorization prior to the service being performed.   For non-scheduling related questions, please contact the cardiac imaging nurse navigator should you have  any questions/concerns: Rockwell Alexandria, Cardiac Imaging Nurse Navigator Larey Brick, Cardiac Imaging Nurse Navigator [place] [facility] Direct Office Dial: [phone]   For scheduling needs, including cancellations and rescheduling, please call [place], [phone].     Important Information About [place]

## 2022-11-06 ENCOUNTER — Other Ambulatory Visit: Payer: Self-pay | Admitting: Family Medicine

## 2022-11-09 ENCOUNTER — Ambulatory Visit (INDEPENDENT_AMBULATORY_CARE_PROVIDER_SITE_OTHER): Payer: Medicare Other | Admitting: Internal Medicine

## 2022-11-09 ENCOUNTER — Encounter: Payer: Self-pay | Admitting: Internal Medicine

## 2022-11-09 VITALS — BP 120/80 | HR 69 | Temp 97.4°F | Wt 234.6 lb

## 2022-11-09 DIAGNOSIS — R350 Frequency of micturition: Secondary | ICD-10-CM | POA: Diagnosis not present

## 2022-11-09 DIAGNOSIS — R102 Pelvic and perineal pain: Secondary | ICD-10-CM

## 2022-11-09 LAB — POCT URINALYSIS DIPSTICK
Bilirubin, UA: NEGATIVE
Blood, UA: NEGATIVE
Glucose, UA: NEGATIVE
Ketones, UA: NEGATIVE
Leukocytes, UA: NEGATIVE
Nitrite, UA: NEGATIVE
Protein, UA: NEGATIVE
Spec Grav, UA: 1.01 (ref 1.010–1.025)
Urobilinogen, UA: 0.2 E.U./dL
pH, UA: 6 (ref 5.0–8.0)

## 2022-11-09 NOTE — Patient Instructions (Signed)
Urine  screen in clear today  but will order culture .  In case .  If sx recur  let us know  and if needed we can add antibiotic  as indicated .

## 2022-11-09 NOTE — Progress Notes (Signed)
Chief Complaint  Patient presents with   Abdominal Pain    Pt reports lower abdominal pain on Friday and notice urinating more frequently. Denied of any burning. Pt added that she did home UTI test this morning, there was positive for leukocyte. Pt states she is not taking any medication for her sx.     HPI: Ann Long 71 y.o. come in for new problem  see aboe  mid lower abd discomfprt   Over weekend  ( 2-3 days) waxing and waning     Has baseline  nocturia   increased 1 every 90 minutes .    Having heart ct on Nov 30  Had  leukocytes this am on otc  . Last utia a while before .  But checking in case  before ct scan next week .  Feels improved  right now. Taking fluids  ROS: See pertinent positives and negatives per HPI. 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, then one in the left leg, then one in the left kidney --all were onsearate occasions    Gallbladder disease    Heart murmur    Hypertension    Jaundice    as a child, her sister was diagnosed with it due to ingestion hepatitis, she later develped januce and thpugh was not testes herself , was presumed to also have the same time of hepatitis and was subsequesntly treated with globulin '   Left bundle branch block    "my pcp told me years ago i had it , 2 -3 years ago my obgyn heard a heart murmur and sent me to [cardiology] where they did a ultrasound of my heart , they didnt find anything definitive" , denies chest pain , sob, syncope    Osteoarthritis    Osteoporosis    Phlebitis    PONV (postoperative nausea and vomiting)     Family History  Problem Relation Age of Onset   Hypertension Mother    Stroke Mother 60   Osteoporosis Mother    Heart disease Mother        in chronic a fib   Heart attack Father 8       fatal   Stroke Father 60    Diabetes Father        ?not diagnosed   Heart attack Sister    Diabetes Sister    Hypertension Sister    Kidney failure Sister    Atrial fibrillation Sister    Heart attack Sister 49   Heart disease Sister        enlarged heart   Hypertension Sister    Diabetes Sister    Heart attack Paternal Grandfather 41   Stroke Sister 40   Hypertension Sister    Atrial fibrillation Sister        multiple conversions   Diabetes Brother    Blindness Maternal Grandmother    Deafness Maternal Grandmother    Heart failure Maternal Grandmother 95   Hypertension Maternal Grandmother    Stomach cancer Maternal Grandfather    Hypertension Maternal Grandfather    Heart disease Paternal Uncle 20   Diabetes Paternal Uncle     Social History   Socioeconomic History   Marital status: Widowed    Spouse name: Not on file  Number of children: Not on file   Years of education: Not on file   Highest education level: Not on file  Occupational History   Not on file  Tobacco Use   Smoking status: Never   Smokeless tobacco: Never  Substance and Sexual Activity   Alcohol use: No   Drug use: No   Sexual activity: Not on file  Other Topics Concern   Not on file  Social History Narrative   Not on file   Social Determinants of Health   Financial Resource Strain: Low Risk  (08/07/2021)   Overall Financial Resource Strain (CARDIA)    Difficulty of Paying Living Expenses: Not hard at all  Food Insecurity: No Food Insecurity (08/07/2021)   Hunger Vital Sign    Worried About Running Out of Food in the Last Year: Never true    Ran Out of Food in the Last Year: Never true  Transportation Needs: No Transportation Needs (08/07/2021)   PRAPARE - Administrator, Civil Service (Medical): No    Lack of Transportation (Non-Medical): No  Physical Activity: Inactive (08/07/2021)   Exercise Vital Sign    Days of Exercise per Week: 0 days    Minutes of Exercise per Session: 0 min  Stress: No Stress  Concern Present (08/07/2021)   [employer] of Occupational Health - Occupational Stress Questionnaire    Feeling of Stress : Not at all  Social Connections: Moderately Isolated (08/07/2021)   Social Connection and Isolation Panel [NHANES]    Frequency of Communication with Friends and Family: Three times a week    Frequency of Social Gatherings with Friends and Family: Three times a week    Attends Religious Services: More than 4 times per year    Active Member of Clubs or Organizations: No    Attends Banker Meetings: Never    Marital Status: Widowed    Outpatient Medications Prior to Visit  Medication Sig Dispense Refill   amoxicillin (AMOXIL) 500 MG capsule Take 500 mg by mouth 2 (two) times daily. FOR DENTIST     carvedilol (COREG) 3.125 MG tablet Take 1 tablet (3.125 mg total) by mouth 2 (two) times daily. 180 tablet 3   cholecalciferol (VITAMIN D) 1000 units tablet Take 2,000 Units by mouth daily.     Cyanocobalamin (VITAMIN B 12 PO) Take 1,000 mcg by mouth daily in the afternoon.     docusate sodium (COLACE) 100 MG capsule Take 100 mg by mouth daily.     Fexofenadine HCl (ALLEGRA PO) Take 10 mg by mouth daily in the afternoon.     fluticasone (FLONASE) 50 MCG/ACT nasal spray Place 1 spray into both nostrils daily as needed for allergies or rhinitis.     folic acid (FOLVITE) 1 MG tablet Take 1 mg by mouth every other day.     hydrochlorothiazide (HYDRODIURIL) 25 MG tablet TAKE 1 TABLET BY MOUTH DAILY 90 tablet 1   hyoscyamine (ANASPAZ) 0.125 MG TBDP disintergrating tablet DISSOLVE 1 TABLET ON THE TONGUE THREE TIMES DAILY BEFORE MEALS AS NEEDED 270 tablet 1   metoprolol tartrate (LOPRESSOR) 50 MG tablet Take 50 mg 2 hours before coronary ct 1 tablet 0   pantoprazole (PROTONIX) 40 MG tablet Take 1 tablet (40 mg total) by mouth daily as needed (acid reflux). 90 tablet 1   Probiotic Product (PROBIOTIC DAILY PO) Take 1 capsule by mouth daily in the afternoon.      psyllium (METAMUCIL) 58.6 % powder Take 1 packet by  mouth daily.     rivaroxaban (XARELTO) 10 MG TABS tablet Take 1 tablet (10 mg total) by mouth daily. 90 tablet 1   rosuvastatin (CRESTOR) 5 MG tablet Take 1 tablet PO Monday, Weds, Friday 90 tablet 3   magic mouthwash (lidocaine, diphenhydrAMINE, alum & mag hydroxide) suspension Swish and spit 5 mLs 4 (four) times daily as needed for mouth pain. (Patient not taking: Reported on 11/03/2022) 180 mL 1   No facility-administered medications prior to visit.     EXAM:  BP 120/80 (BP Location: Right Arm, Patient Position: Sitting, Cuff Size: Large)   Pulse 69   Temp (!) 97.4 F (36.3 C) (Oral)   Wt 234 lb 9.6 oz (106.4 kg)   SpO2 96%   BMI 36.74 kg/m   Body mass index is 36.74 kg/m.  GENERAL: vitals reviewed and listed above, alert, oriented, appears well hydrated and in no acute distress HEENT: atraumatic, conjunctiva  clear, no obvious abnormalities on inspection of external nose and ears  NECK: no obvious masses on inspection palpation  LUNGS: no resp distress CV: HRRR, no clubbing cyanosis nl cap refill  Abdomen:  Sof,t normal bowel sounds without hepatosplenomegaly, no guarding rebound or masses no CVA tenderness min tenderenss discomfort in suprapubic area MS: moves all extremities without noticeable focal  abnormality PSYCH: pleasant and cooperative, no obvious depression or anxiety Lab Results  Component Value Date   WBC 6.3 04/06/2022   HGB 13.9 04/06/2022   HCT 41.9 04/06/2022   PLT 332.0 04/06/2022   GLUCOSE 92 11/03/2022   CHOL 144 04/06/2022   TRIG 151.0 (H) 04/06/2022   HDL 41.20 04/06/2022   LDLDIRECT 110.0 03/24/2021   LDLCALC 73 04/06/2022   ALT 13 04/06/2022   AST 17 04/06/2022   NA 139 11/03/2022   K 4.0 11/03/2022   CL 102 11/03/2022   CREATININE 0.91 11/03/2022   BUN 16 11/03/2022   CO2 25 11/03/2022   TSH 1.61 09/24/2021   INR 2.3 (H) 04/25/2021   BP Readings from Last 3 Encounters:  11/09/22  120/80  11/03/22 134/86  09/29/22 (!) 130/90   Urinalysis    Component Value Date/Time   COLORURINE YELLOW 09/04/2008 0810   APPEARANCEUR CLEAR 09/04/2008 0810   LABSPEC 1.024 09/04/2008 0810   PHURINE 6.0 09/04/2008 0810   GLUCOSEU NEGATIVE 09/04/2008 0810   HGBUR NEGATIVE 09/04/2008 0810   BILIRUBINUR Negative 11/09/2022 1531   KETONESUR NEGATIVE 09/04/2008 0810   PROTEINUR Negative 11/09/2022 1531   PROTEINUR NEGATIVE 09/04/2008 0810   UROBILINOGEN 0.2 11/09/2022 1531   UROBILINOGEN 1.0 09/04/2008 0810   NITRITE Negative 11/09/2022 1531   NITRITE NEGATIVE 09/04/2008 0810   LEUKOCYTESUR Negative 11/09/2022 1531     ASSESSMENT AND PLAN:  Discussed the following assessment and plan:  Urinary frequency - Plan: POC Urinalysis Dipstick, Culture, Urine, Culture, Urine  Suprapubic discomfort Sx supcious for cystitis mild poss  dec sx with hydration  Urine culture to be sent   and treat accordingly  if sx recur contact us -Patient advised to return or notify health care team  if  new concerns arise.  Patient Instructions  Urine  screen in clear today  but will order culture .  In case .  If sx recur  let us know  and if needed we can add antibiotic  as indicated .    Standley Brooking. Lillar Bianca M.D.

## 2022-11-10 LAB — URINE CULTURE
MICRO NUMBER:: 14212605
SPECIMEN QUALITY:: ADEQUATE

## 2022-11-11 NOTE — Progress Notes (Signed)
Urine  shows no predominant bacteria   so no uti documented   continue  fluids self care and .  If   relapsing   contact medical team for advice . No antibiotic indicated that would help  at this time

## 2022-11-17 ENCOUNTER — Telehealth (HOSPITAL_COMMUNITY): Payer: Self-pay | Admitting: Emergency Medicine

## 2022-11-17 NOTE — Telephone Encounter (Signed)
Reaching out to patient to offer assistance regarding upcoming cardiac imaging study; pt verbalizes understanding of appt date/time, parking situation and where to check in, pre-test NPO status and medications ordered, and verified current allergies; name and call back number provided for further questions should they arise Rockwell Alexandria RN Navigator Cardiac Imaging [facility] [phone] office [phone] cell  Arrival 830 50mg  metoprolol Holding carvedilol, HCTZ, allergy meds Denies iv issues

## 2022-11-19 ENCOUNTER — Ambulatory Visit (HOSPITAL_BASED_OUTPATIENT_CLINIC_OR_DEPARTMENT_OTHER)
Admission: RE | Admit: 2022-11-19 | Discharge: 2022-11-19 | Disposition: A | Discharge status: Home / Self Care | Payer: Medicare Other | Source: Ambulatory Visit | Attending: Cardiology | Admitting: Cardiology

## 2022-11-19 ENCOUNTER — Other Ambulatory Visit (HOSPITAL_COMMUNITY): Payer: Self-pay | Admitting: Emergency Medicine

## 2022-11-19 ENCOUNTER — Ambulatory Visit (HOSPITAL_COMMUNITY)
Admission: RE | Admit: 2022-11-19 | Discharge: 2022-11-19 | Disposition: A | Discharge status: Home / Self Care | Payer: Medicare Other | Source: Ambulatory Visit | Attending: Cardiovascular Disease | Admitting: Cardiovascular Disease

## 2022-11-19 DIAGNOSIS — R931 Abnormal findings on diagnostic imaging of heart and coronary circulation: Secondary | ICD-10-CM | POA: Insufficient documentation

## 2022-11-19 DIAGNOSIS — R072 Precordial pain: Secondary | ICD-10-CM | POA: Diagnosis present

## 2022-11-19 DIAGNOSIS — I5042 Chronic combined systolic (congestive) and diastolic (congestive) heart failure: Secondary | ICD-10-CM | POA: Insufficient documentation

## 2022-11-19 DIAGNOSIS — I2510 Atherosclerotic heart disease of native coronary artery without angina pectoris: Secondary | ICD-10-CM | POA: Diagnosis not present

## 2022-11-19 DIAGNOSIS — R0602 Shortness of breath: Secondary | ICD-10-CM | POA: Diagnosis present

## 2022-11-19 MED ORDER — IOHEXOL 350 MG/ML SOLN
100.0000 mL | Freq: Once | INTRAVENOUS | Status: AC | PRN
  Administered 2022-11-19: 100 mL via INTRAVENOUS

## 2022-11-19 MED ORDER — NITROGLYCERIN 0.4 MG SL SUBL
SUBLINGUAL_TABLET | SUBLINGUAL | Status: AC
  Filled 2022-11-19: qty 2

## 2022-11-19 MED ORDER — NITROGLYCERIN 0.4 MG SL SUBL
0.8000 mg | SUBLINGUAL_TABLET | Freq: Once | SUBLINGUAL | Status: AC
  Administered 2022-11-19: 0.8 mg via SUBLINGUAL

## 2022-11-19 MED ORDER — ENTRESTO 24-26 MG PO TABS: 1.0000 | ORAL_TABLET | Freq: Two times a day (BID) | ORAL | 3 refills | Status: DC

## 2022-11-19 NOTE — Addendum Note (Signed)
Addended by: Thurmon Fair on: 11/19/2022 05:19 PM   Modules accepted: Orders

## 2022-11-23 ENCOUNTER — Ambulatory Visit: Payer: Medicare Other | Admitting: Interventional Cardiology

## 2022-12-01 NOTE — Progress Notes (Signed)
Cardiology Clinic Note   Patient Name: Ann Long Date of Encounter: 12/02/2022  Primary Care Provider:  Philip Aspen, Limmie Patricia, MD Primary Cardiologist: Dr. Royann Shivers  Patient Profile    Ann Long 71 year old female presents the clinic today for follow-up evaluation post coronary CTA.  Past Medical History    Past Medical History:  Diagnosis Date   Abnormal vaginal Pap smear    Arthritis    Deep vein thrombosis (DVT) (HCC)    reports they were sporadic , right leg  to vena cava, then one in the left leg, then one in the left kidney --all were onsearate occasions    Gallbladder disease    Heart murmur    Hypertension    Jaundice    as a child, her sister was diagnosed with it due to ingestion hepatitis, she later develped januce and thpugh was not testes herself , was presumed to also have the same time of hepatitis and was subsequesntly treated with globulin '   Left bundle branch block    "my pcp told me years ago i had it , 2 -3 years ago my obgyn heard a heart murmur and sent me to [cardiology] where they did a ultrasound of my heart , they didnt find anything definitive" , denies chest pain , sob, syncope    Osteoarthritis    Osteoporosis    Phlebitis    PONV (postoperative nausea and vomiting)    Past Surgical History:  Procedure Laterality Date   ABLATION  uterine   ?1995   CHOLECYSTECTOMY  over 10 years ago   REPLACEMENT TOTAL HIP W/  RESURFACING IMPLANTS     TOTAL HIP ARTHROPLASTY Left 2009   at [facility]    TOTAL HIP ARTHROPLASTY Right 09/20/2019   Procedure: TOTAL HIP ARTHROPLASTY ANTERIOR APPROACH;  Surgeon: Ollen Gross, MD;  Location: [facility];  Service: Orthopedics;  Laterality: Right;    TUBAL LIGATION  1984    Allergies  Allergies  Allergen Reactions   Doxycycline Diarrhea   Sulfa Antibiotics Rash    History of Present Illness    Ann Long has a PMH of chronic combined systolic and diastolic CHF, precordial pain,  shortness of breath, left bundle branch block, recurrent DVT, obesity, and dyslipidemia.  She was seen in follow-up by Dr. Royann Shivers on 11/03/2022.  During that time she was noted to be NYHA class II with DOE.  She noted having shortness of breath if she had to climb a flight of stairs.  She would have to stop on a landing to catch her breath before continuing to climb stairs.  She felt that this was somewhat related to her weight.  She did note some diminished stamina.  She denied lower extremity swelling, orthopnea or PND.  She denied issues with chest discomfort.  She did note that during more intense physical activity she had some pain in her right shoulder which resolved with rest.  She denied palpitations, dizziness, presyncope, and neurological issues.  A BMP was ordered and showed stable renal and electrolytes.  Coronary CTA was ordered and showed coronary calcium score of 34.3 which placed her in the 56 percentile for age and gender suggesting intermediate risk for cardiac events.  She was noted to have mid LAD 25-49% stenosis, no plaque in her circumflex, and 70-99% ostial RCA stenosis with an FFR of 0.77 in her mid LAD.  Results were reviewed by Dr. Royann Shivers and it was felt that if she  remained stable medical management with lowering LDL to 70 or less would be an appropriate course of treatment.  Sherryll Burger was started 24-26 for CHF with plan to continue to uptitrate GDMT.  She presents to the clinic today for follow-up evaluation and states she has noticed some dizziness after she gets out of the water aerobics and with increased physical activity since she was started carvedilol and Entresto.  We reviewed her coronary CTA and reduced ejection fraction.  She and her daughter expressed understanding.  We outlined goals for medication management and therapy.  I reviewed importance of maintaining physical activity, increasing fiber in her diet and limiting cholesterol.  I will start Marcelline Deist and order a  BMP 1 week after initiation.  Will plan follow-up in 1 month.  We also will have her increase her rosuvastatin to 4 days/week and plan to repeat fasting lipids and LFTs  6-8 weeks.  Today she denies chest pain, shortness of breath, lower extremity edema, fatigue, palpitations, melena, hematuria, hemoptysis, diaphoresis, weakness, presyncope, syncope, orthopnea, and PND.    Home Medications    Prior to Admission medications   Medication Sig Start Date End Date Taking? Authorizing Provider  amoxicillin (AMOXIL) 500 MG capsule Take 500 mg by mouth 2 (two) times daily. FOR DENTIST 11/17/15   [provider]  carvedilol (COREG) 3.125 MG tablet Take 1 tablet (3.125 mg total) by mouth 2 (two) times daily. 11/03/22 10/29/23  Croitoru, Mihai, MD  cholecalciferol (VITAMIN D) 1000 units tablet Take 2,000 Units by mouth daily.    [provider]  Cyanocobalamin (VITAMIN B 12 PO) Take 1,000 mcg by mouth daily in the afternoon.    [provider]  docusate sodium (COLACE) 100 MG capsule Take 100 mg by mouth daily.    [provider]  Fexofenadine HCl (ALLEGRA PO) Take 10 mg by mouth daily in the afternoon.    [provider]  fluticasone (FLONASE) 50 MCG/ACT nasal spray Place 1 spray into both nostrils daily as needed for allergies or rhinitis.    [provider]  folic acid (FOLVITE) 1 MG tablet Take 1 mg by mouth every other day.    [provider]  hydrochlorothiazide (HYDRODIURIL) 25 MG tablet TAKE 1 TABLET BY MOUTH DAILY 11/09/22   Philip Aspen, Limmie Patricia, MD  hyoscyamine (ANASPAZ) 0.125 MG TBDP disintergrating tablet DISSOLVE 1 TABLET ON THE TONGUE THREE TIMES DAILY BEFORE MEALS AS NEEDED 10/13/22   Philip Aspen, Limmie Patricia, MD  metoprolol tartrate (LOPRESSOR) 50 MG tablet Take 50 mg 2 hours before coronary ct 11/03/22   Croitoru, Mihai, MD  pantoprazole (PROTONIX) 40 MG tablet Take 1 tablet (40 mg total) by mouth daily as needed (acid  reflux). 10/13/22   Philip Aspen, Limmie Patricia, MD  Probiotic Product (PROBIOTIC DAILY PO) Take 1 capsule by mouth daily in the afternoon.    [provider]  psyllium (METAMUCIL) 58.6 % powder Take 1 packet by mouth daily.    [provider]  rivaroxaban (XARELTO) 10 MG TABS tablet Take 1 tablet (10 mg total) by mouth daily. 10/13/22   Philip Aspen, Limmie Patricia, MD  rosuvastatin (CRESTOR) 5 MG tablet Take 1 tablet PO Monday, Weds, Friday 12/05/21   Koberlein, Paris Lore, MD  sacubitril-valsartan (ENTRESTO) 24-26 MG Take 1 tablet by mouth 2 (two) times daily. 11/19/22   Croitoru, Rachelle Hora, MD    Family History    Family History  Problem Relation Age of Onset   Hypertension Mother    Stroke  Mother 5080   Osteoporosis Mother    Heart disease Mother        in chronic a fib   Heart attack Father 951       fatal   Stroke Father 7848   Diabetes Father        ?not diagnosed   Heart attack Sister    Diabetes Sister    Hypertension Sister    Kidney failure Sister    Atrial fibrillation Sister    Heart attack Sister 455   Heart disease Sister        enlarged heart   Hypertension Sister    Diabetes Sister    Heart attack Paternal Grandfather [phone]   Stroke Sister 7660   Hypertension Sister    Atrial fibrillation Sister        multiple conversions   Diabetes Brother    Blindness Maternal Grandmother    Deafness Maternal Grandmother    Heart failure Maternal Grandmother 95   Hypertension Maternal Grandmother    Stomach cancer Maternal Grandfather    Hypertension Maternal Grandfather    Heart disease Paternal Uncle 1440   Diabetes Paternal Uncle    She indicated that her mother is deceased. She indicated that her father is deceased. She indicated that two of her three sisters are alive. She indicated that her brother is alive. She indicated that her maternal grandmother is deceased. She indicated that her maternal grandfather is deceased. She indicated that her paternal grandfather  is deceased. She indicated that both of her paternal uncles are deceased.  Social History    Social History   Socioeconomic History   Marital status: Widowed    Spouse name: Not on file   Number of children: Not on file   Years of education: Not on file   Highest education level: Not on file  Occupational History   Not on file  Tobacco Use   Smoking status: Never   Smokeless tobacco: Never  Substance and Sexual Activity   Alcohol use: No   Drug use: No   Sexual activity: Not on file  Other Topics Concern   Not on file  Social History Narrative   Not on file   Social Determinants of Health   Financial Resource Strain: Low Risk  (08/07/2021)   Overall Financial Resource Strain (CARDIA)    Difficulty of Paying Living Expenses: Not hard at all  Food Insecurity: No Food Insecurity (08/07/2021)   Hunger Vital Sign    Worried About Running Out of Food in the Last Year: Never true    Ran Out of Food in the Last Year: Never true  Transportation Needs: No Transportation Needs (08/07/2021)   PRAPARE - Administrator, Civil ServiceTransportation    Lack of Transportation (Medical): No    Lack of Transportation (Non-Medical): No  Physical Activity: Inactive (08/07/2021)   Exercise Vital Sign    Days of Exercise per Week: 0 days    Minutes of Exercise per Session: 0 min  Stress: No Stress Concern Present (08/07/2021)   [employer] - Occupational Stress Questionnaire    Feeling of Stress : Not at all  Social Connections: Moderately Isolated (08/07/2021)   Social Connection and Isolation Panel [NHANES]    Frequency of Communication with Friends and Family: Three times a week    Frequency of Social Gatherings with Friends and Family: Three times a week    Attends Religious Services: More than 4 times per year    Active Member of Clubs or  Organizations: No    Attends Banker Meetings: Never    Marital Status: Widowed  Intimate Partner Violence: Not At Risk (08/07/2021)    Humiliation, Afraid, Rape, and Kick questionnaire    Fear of Current or Ex-Partner: No    Emotionally Abused: No    Physically Abused: No    Sexually Abused: No     Review of Systems    General:  No chills, fever, night sweats or weight changes.  Cardiovascular:  No chest pain, dyspnea on exertion, edema, orthopnea, palpitations, paroxysmal nocturnal dyspnea. Dermatological: No rash, lesions/masses Respiratory: No cough, dyspnea Urologic: No hematuria, dysuria Abdominal:   No nausea, vomiting, diarrhea, bright red blood per rectum, melena, or hematemesis Neurologic:  No visual changes, wkns, changes in mental status. All other systems reviewed and are otherwise negative except as noted above.  Physical Exam    VS:  BP 104/70   Pulse 84   Ht 5\' 7"  (1.702 m)   Wt 228 lb 12.8 oz (103.8 kg)   SpO2 97%   BMI 35.84 kg/m  , BMI Body mass index is 35.84 kg/m. GEN: Well nourished, well developed, in no acute distress. HEENT: normal. Neck: Supple, no JVD, carotid bruits, or masses. Cardiac: RRR, 2/6 systolic murmur heard along right sternal border , rubs, or gallops. No clubbing, cyanosis, edema.  Radials/DP/PT 2+ and equal bilaterally.  Respiratory:  Respirations regular and unlabored, clear to auscultation bilaterally. GI: Soft, nontender, nondistended, BS + x 4. MS: no deformity or atrophy. Skin: warm and dry, no rash. Neuro:  Strength and sensation are intact. Psych: Normal affect.  Accessory Clinical Findings    Recent Labs: 04/06/2022: ALT 13; Hemoglobin 13.9; Platelets 332.0 11/03/2022: BUN 16; Creatinine, Ser 0.91; Potassium 4.0; Sodium 139   Recent Lipid Panel    Component Value Date/Time   CHOL 144 04/06/2022 1031   TRIG 151.0 (H) 04/06/2022 1031   HDL 41.20 04/06/2022 1031   CHOLHDL 3 04/06/2022 1031   VLDL 30.2 04/06/2022 1031   LDLCALC 73 04/06/2022 1031   LDLDIRECT 110.0 03/24/2021 1101         ECG personally reviewed by me today- None  today.  Echocardiogram 10/21/2022  IMPRESSIONS     1. Mild global hypokinesis worse in the anteroseptum and inferoseptum.  Left ventricular ejection fraction, by estimation, is 35 to 40%. The left  ventricle has moderately decreased function. The left ventricle  demonstrates regional wall motion  abnormalities (see scoring diagram/findings for description). There is  mild concentric left ventricular hypertrophy. Left ventricular diastolic  parameters are consistent with Grade I diastolic dysfunction (impaired  relaxation). The average left  ventricular global longitudinal strain is -18.7 %. The global longitudinal  strain is normal.   2. Right ventricular systolic function is normal. The right ventricular  size is normal. There is normal pulmonary artery systolic pressure.   3. The mitral valve is normal in structure. Trivial mitral valve  regurgitation. No evidence of mitral stenosis.   4. The aortic valve is tricuspid. Aortic valve regurgitation is not  visualized. No aortic stenosis is present.   5. The inferior vena cava is normal in size with greater than 50%  respiratory variability, suggesting right atrial pressure of 3 mmHg.   FINDINGS   Left Ventricle: Mild global hypokinesis worse in the anteroseptum and  inferoseptum. Left ventricular ejection fraction, by estimation, is 35 to  40%. The left ventricle has moderately decreased function. The left  ventricle demonstrates regional wall  motion abnormalities.  The average left ventricular global longitudinal  strain is -18.7 %. The global longitudinal strain is normal. The left  ventricular internal cavity size was normal in size. There is mild  concentric left ventricular hypertrophy. Left  ventricular diastolic parameters are consistent with Grade I diastolic  dysfunction (impaired relaxation). Indeterminate filling pressures.   Right Ventricle: The right ventricular size is normal. No increase in  right ventricular wall  thickness. Right ventricular systolic function is  normal. There is normal pulmonary artery systolic pressure. The tricuspid  regurgitant velocity is 2.68 m/s, and   with an assumed right atrial pressure of 3 mmHg, the estimated right  ventricular systolic pressure is 31.7 mmHg.   Left Atrium: Left atrial size was normal in size.   Right Atrium: Right atrial size was normal in size.   Pericardium: There is no evidence of pericardial effusion.   Mitral Valve: The mitral valve is normal in structure. Mild mitral annular  calcification. Trivial mitral valve regurgitation. No evidence of mitral  valve stenosis.   Tricuspid Valve: The tricuspid valve is normal in structure. Tricuspid  valve regurgitation is mild . No evidence of tricuspid stenosis.   Aortic Valve: The aortic valve is tricuspid. Aortic valve regurgitation is  not visualized. No aortic stenosis is present.   Pulmonic Valve: The pulmonic valve was normal in structure. Pulmonic valve  regurgitation is not visualized. No evidence of pulmonic stenosis.   Aorta: The aortic root is normal in size and structure.   Venous: The inferior vena cava is normal in size with greater than 50%  respiratory variability, suggesting right atrial pressure of 3 mmHg.   IAS/Shunts: No atrial level shunt detected by color flow Doppler.   Coronary CTA 11/19/2022  TECHNIQUE: The patient was scanned on a Siemens 192 slice scanner. Gantry rotation speed was 250 msecs. Collimation was 0.6 mm. A 100 kV prospective scan was triggered in the ascending thoracic aorta at 35-75% of the R-R interval. Average HR during the scan was 60 bpm. The 3D data set was interpreted on a dedicated work station using MPR, MIP and VRT modes. A total of 80cc of contrast was used.   FINDINGS: Non-cardiac: See separate report from [facility].   Pulmonary veins drain normally to the left atrium. No LA appendage thrombus.   Calcium Score: 34.3 Agatston  units.   Coronary Arteries: Right dominant with no anomalies   LM: No plaque or stenosis.   LAD system: Calcified plaque mid LAD with mild (25-49%) stenosis.   Circumflex system: Moderate ramus, no plaque or stenosis. No plaque or stenosis in the AV LCx.   RCA system: Severe (70-99%) ostial RCA stenosis with noncalcified plaque. FFR 0.77 in mid RCA.   IMPRESSION: 1. Coronary artery calcium score 34.3 Agatston units. This places the patient in the 56th percentile for age and gender, suggesting intermediate risk for future cardiac events.   2. Visually severe ostial RCA stenosis with noncalcified plaque. FFR 0.77 in the mid RCA suggests borderline hemodynamic significance of the ostial RCA stenosis.   Dalton Mclean     Electronically Signed   By: Marca Ancona M.D.   On: 11/19/2022 15:49  Assessment & Plan   1.  Chronic combined systolic and diastolic CHF-NYHA class II.  Trying to increase physical activity, water aerobics 3 days/week.  EF reevaluated/reviewed by Dr. Royann Shivers and he noted EF to be closer to 45%.  GDMT was recommended.  He was not felt that diuretics would be needed at this  time.  Plan for SGLT2 inhibitor was made during last cardiology clinic visit.  Will plan for repeat echocardiogram once GDMT has been optimized x 1 month. Continue carvedilol , Sherryll Burger, Start Farxiga 10 mg daily Heart healthy low-sodium diet-salty 6 given Increase physical activity as tolerated Repeat BMP in 1 week after initiating Farxiga  Coronary artery disease-denies recent episodes of chest discomfort.  Coronary CTA showed 25-49% mid LAD, 70-99% ostial RCA with 0.77 FFR and mid RCA. Continue carvedilol,  Increase rosuvastatin to 4 days/week Heart healthy low-sodium diet-salty 6 given Increase physical activity as tolerated   Hyperlipidemia-04/06/2022: Cholesterol 144; HDL 41.20; LDL Cholesterol 73; Triglycerides 151.0; VLDL 30.2.  No aspirin on Xarelto Increase rosuvastatin to 4  days/week Heart healthy low-sodium high-fiber diet Increase physical activity as tolerated Repeat fasting lipids and LFTs in 6-8 weeks  History of DVT-noted to have 4 separate episodes of DVT.  1 was associated with orthopedic procedure involving a total hip replacement in 2021.  Lifelong anticoagulation recommended.  Underwent evaluation by hematologist which was unrevealing. Continue Xarelto Follows with PCP  Disposition: Follow-up with Dr. Royann Shivers or me in 1-2 months.   Thomasene Ripple. Aleighna Wojtas NP-C     12/02/2022, 9:27 AM [facility] [address] Office [phone] Fax [phone]    I spent 14 minutes examining this patient, reviewing medications, and using patient centered shared decision making involving her cardiac care.  Prior to her visit I spent greater than 20 minutes reviewing her past medical history,  medications, and prior cardiac tests.

## 2022-12-02 ENCOUNTER — Encounter: Payer: Self-pay | Admitting: General Practice

## 2022-12-02 ENCOUNTER — Ambulatory Visit: Payer: Medicare Other | Attending: General Practice | Admitting: General Practice

## 2022-12-02 VITALS — BP 104/70 | HR 84 | Ht 67.0 in | Wt 228.8 lb

## 2022-12-02 DIAGNOSIS — I5042 Chronic combined systolic (congestive) and diastolic (congestive) heart failure: Secondary | ICD-10-CM | POA: Diagnosis not present

## 2022-12-02 DIAGNOSIS — E785 Hyperlipidemia, unspecified: Secondary | ICD-10-CM

## 2022-12-02 DIAGNOSIS — I2510 Atherosclerotic heart disease of native coronary artery without angina pectoris: Secondary | ICD-10-CM | POA: Diagnosis not present

## 2022-12-02 DIAGNOSIS — I82409 Acute embolism and thrombosis of unspecified deep veins of unspecified lower extremity: Secondary | ICD-10-CM

## 2022-12-02 MED ORDER — ROSUVASTATIN CALCIUM 10 MG PO TABS: ORAL_TABLET | ORAL | 1 refills | Status: DC

## 2022-12-02 MED ORDER — ENTRESTO 24-26 MG PO TABS: 1.0000 | ORAL_TABLET | Freq: Two times a day (BID) | ORAL | 1 refills | Status: DC

## 2022-12-02 MED ORDER — DAPAGLIFLOZIN PROPANEDIOL 10 MG PO TABS: 10.0000 mg | ORAL_TABLET | Freq: Every day | ORAL | 1 refills | Status: DC

## 2022-12-02 NOTE — Addendum Note (Signed)
Addended by: Alyson Ingles on: 12/02/2022 09:30 AM   Modules accepted: Orders

## 2022-12-02 NOTE — Patient Instructions (Addendum)
Medication Instructions:  START FARXIGA 10MG  DAILY  INCREASE Rosuvastatin TAKE 4 DAYS A WEEK  *If you need a refill on your cardiac medications before your next appointment, please call your pharmacy*  Lab Work: BMET 1 WEEK AFTER STARTING THE FARXIGA If you have labs (blood work) drawn today and your tests are completely normal, you will receive your results only by: MyChart Message (if you have MyChart) OR A paper copy in the mail   If you have any lab test that is abnormal or we need to change your treatment, we will call you to review the results.  Other Instructions PLEASE READ AND FOLLOW ATTACHED  SALTY 6  INCREASE HYDRATION AN ADDITIONAL 10-20 OUNCES DAILY  Follow-Up: At [facility], you and your health needs are our priority.  As part of our continuing mission to provide you with exceptional heart care, we have created designated Provider Care Teams.  These Care Teams include your primary Cardiologist (physician) and Advanced Practice Providers (APPs -  Physician Assistants and Nurse Practitioners) who all work together to provide you with the care you need, when you need it.  Your next appointment:   1 month(s)  The format for your next appointment:   In Person  Provider:   [facility], MD  or Thurmon Fair, FNP        Important Information About Sugar

## 2022-12-08 ENCOUNTER — Telehealth: Payer: Self-pay | Admitting: Internal Medicine

## 2022-12-08 NOTE — Telephone Encounter (Signed)
Pt is calling and will be seeing cardiologist on 01-01-2023 and per pt they are following her bp and pt would like to know if she should cancel her appt with dr Ardyth Harps on 12-30-2022. Please advise

## 2022-12-16 LAB — BASIC METABOLIC PANEL
BUN/Creatinine Ratio: 21 (ref 12–28)
BUN: 24 mg/dL (ref 8–27)
CO2: 24 mmol/L (ref 20–29)
Calcium: 9.6 mg/dL (ref 8.7–10.3)
Chloride: 99 mmol/L (ref 96–106)
Creatinine, Ser: 1.15 mg/dL — ABNORMAL HIGH (ref 0.57–1.00)
Glucose: 93 mg/dL (ref 70–99)
Potassium: 3.9 mmol/L (ref 3.5–5.2)
Sodium: 138 mmol/L (ref 134–144)
eGFR: 51 mL/min/{1.73_m2} — ABNORMAL LOW (ref >59)

## 2022-12-17 ENCOUNTER — Telehealth: Payer: Self-pay | Admitting: General Practice

## 2022-12-17 ENCOUNTER — Other Ambulatory Visit: Payer: Self-pay

## 2022-12-17 DIAGNOSIS — I5042 Chronic combined systolic (congestive) and diastolic (congestive) heart failure: Secondary | ICD-10-CM

## 2022-12-17 MED ORDER — DAPAGLIFLOZIN PROPANEDIOL 5 MG PO TABS: 5.0000 mg | ORAL_TABLET | Freq: Every day | ORAL | 3 refills | Status: DC

## 2022-12-17 NOTE — Telephone Encounter (Signed)
Called patient, advised of results:  Ann Asters, NP 12/16/2022  7:19 AM EST     Please contact Ann Long and let her know that her lab work is been reviewed.  Her creatinine was elevated at 1.15 and her GFR was decreased to 51.  We will decrease her Farxiga to 5 mg daily and repeat her BMP in 2 weeks.  Please ask her to slightly increase her p.o. hydration.  Thank you.    Advised of decrease Marcelline Deist (she will attempt to cut in half, will notify us if she is unable) BMET ordered for 2 weeks. Patient has appointment on 01/12 will have labs checked at this appointment.   Thank you!

## 2022-12-17 NOTE — Addendum Note (Signed)
Addended by: Darene Lamer T on: 12/17/2022 04:12 PM   Modules accepted: Orders

## 2022-12-17 NOTE — Telephone Encounter (Signed)
Pt returning call for lab results  

## 2022-12-17 NOTE — Telephone Encounter (Signed)
Contact patient. Left a voicemail to call us back.  

## 2022-12-23 NOTE — Telephone Encounter (Signed)
Pt returning call,  asking whether she should cancel or not.

## 2022-12-23 NOTE — Telephone Encounter (Signed)
  Spoke to patient. Pt states she will keep her appt on 12/30/2022 with Dr. Jerilee Hoh.

## 2022-12-30 ENCOUNTER — Ambulatory Visit (INDEPENDENT_AMBULATORY_CARE_PROVIDER_SITE_OTHER): Payer: Medicare Other | Admitting: Internal Medicine

## 2022-12-30 VITALS — BP 130/72 | HR 67 | Temp 97.9°F | Wt 225.0 lb

## 2022-12-30 DIAGNOSIS — I519 Heart disease, unspecified: Secondary | ICD-10-CM

## 2022-12-30 DIAGNOSIS — I10 Essential (primary) hypertension: Secondary | ICD-10-CM | POA: Diagnosis not present

## 2022-12-30 DIAGNOSIS — E785 Hyperlipidemia, unspecified: Secondary | ICD-10-CM

## 2022-12-30 DIAGNOSIS — Z86718 Personal history of other venous thrombosis and embolism: Secondary | ICD-10-CM | POA: Diagnosis not present

## 2022-12-30 DIAGNOSIS — Z7901 Long term (current) use of anticoagulants: Secondary | ICD-10-CM

## 2022-12-30 NOTE — Progress Notes (Signed)
Established Patient Office Visit     CC/Reason for Visit: Follow-up chronic conditions  HPI: Ann Long is a 72 y.o. female who is coming in today for the above mentioned reasons. 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  Most recently she was found on echocardiogram to have a depressed EF that led to a coronary CT.  She is now being followed by cardiology.  She is on Entresto, carvedilol and [place].  She is feeling well.  Is wondering whether she should obtain RSV vaccine.   Past Medical/Surgical History: Past Medical History:  Diagnosis Date   Abnormal vaginal Pap smear    Arthritis    Deep vein thrombosis (DVT) (HCC)    reports they were sporadic , right leg  to vena cava, then one in the left leg, then one in the left kidney --all were onsearate occasions    Gallbladder disease    Heart murmur    Hypertension    Jaundice    as a child, her sister was diagnosed with it due to ingestion hepatitis, she later develped januce and thpugh was not testes herself , was presumed to also have the same time of hepatitis and was subsequesntly treated with globulin '   Left bundle branch block    "my pcp told me years ago i had it , 2 -3 years ago my obgyn heard a heart murmur and sent me to [cardiology] where they did a ultrasound of my heart , they didnt find anything definitive" , denies chest pain , sob, syncope    Osteoarthritis    Osteoporosis    Phlebitis    PONV (postoperative nausea and vomiting)     Past Surgical History:  Procedure Laterality Date   ABLATION  uterine   ?1995   CHOLECYSTECTOMY  over 10 years ago   REPLACEMENT TOTAL HIP W/  RESURFACING IMPLANTS     TOTAL HIP ARTHROPLASTY Left 2009   at [facility]    TOTAL HIP ARTHROPLASTY Right 09/20/2019   Procedure: TOTAL HIP ARTHROPLASTY ANTERIOR APPROACH;  Surgeon: Ollen Gross, MD;  Location: [facility];  Service: Orthopedics;  Laterality: Right;    TUBAL LIGATION  1984    Social History:  reports that she has never smoked. She has never used smokeless tobacco. She reports that she does not drink alcohol and does not use drugs.  Allergies: Allergies  Allergen Reactions   Doxycycline Diarrhea   Sulfa Antibiotics Rash    Family History:  Family History  Problem Relation Age of Onset   Hypertension Mother    Stroke Mother 45   Osteoporosis Mother    Heart disease Mother        in chronic a fib   Heart attack Father 29       fatal   Stroke Father 22   Diabetes Father        ?not diagnosed   Heart attack Sister    Diabetes Sister    Hypertension Sister    Kidney failure Sister    Atrial fibrillation Sister    Heart attack Sister 12   Heart disease Sister        enlarged heart   Hypertension Sister    Diabetes Sister    Heart attack Paternal Grandfather 38   Stroke Sister 37   Hypertension Sister    Atrial fibrillation Sister        multiple  conversions   Diabetes Brother    Blindness Maternal Grandmother    Deafness Maternal Grandmother    Heart failure Maternal Grandmother 95   Hypertension Maternal Grandmother    Stomach cancer Maternal Grandfather    Hypertension Maternal Grandfather    Heart disease Paternal Uncle 60   Diabetes Paternal Uncle      Current Outpatient Medications:    amoxicillin (AMOXIL) 500 MG capsule, Take 500 mg by mouth 2 (two) times daily. FOR DENTIST, Disp: , Rfl:    carvedilol (COREG) 3.125 MG tablet, Take 1 tablet (3.125 mg total) by mouth 2 (two) times daily., Disp: 180 tablet, Rfl: 3   cholecalciferol (VITAMIN D) 1000 units tablet, Take 2,000 Units by mouth daily., Disp: , Rfl:    Cyanocobalamin (VITAMIN B 12 PO), Take 1,000 mcg by mouth daily in the afternoon., Disp: , Rfl:    dapagliflozin propanediol (FARXIGA) 5 MG TABS tablet, Take 1 tablet (5 mg total) by mouth daily before breakfast., Disp: 90 tablet, Rfl: 3    docusate sodium (COLACE) 100 MG capsule, Take 100 mg by mouth daily., Disp: , Rfl:    Fexofenadine HCl (ALLEGRA PO), Take 10 mg by mouth daily in the afternoon., Disp: , Rfl:    fluticasone (FLONASE) 50 MCG/ACT nasal spray, Place 1 spray into both nostrils daily as needed for allergies or rhinitis., Disp: , Rfl:    folic acid (FOLVITE) 1 MG tablet, Take 1 mg by mouth every other day., Disp: , Rfl:    hydrochlorothiazide (HYDRODIURIL) 25 MG tablet, TAKE 1 TABLET BY MOUTH DAILY, Disp: 90 tablet, Rfl: 1   hyoscyamine (ANASPAZ) 0.125 MG TBDP disintergrating tablet, DISSOLVE 1 TABLET ON THE TONGUE THREE TIMES DAILY BEFORE MEALS AS NEEDED, Disp: 270 tablet, Rfl: 1   pantoprazole (PROTONIX) 40 MG tablet, Take 1 tablet (40 mg total) by mouth daily as needed (acid reflux)., Disp: 90 tablet, Rfl: 1   Probiotic Product (PROBIOTIC DAILY PO), Take 1 capsule by mouth daily in the afternoon., Disp: , Rfl:    psyllium (METAMUCIL) 58.6 % powder, Take 1 packet by mouth daily., Disp: , Rfl:    rivaroxaban (XARELTO) 10 MG TABS tablet, Take 1 tablet (10 mg total) by mouth daily., Disp: 90 tablet, Rfl: 1   rosuvastatin (CRESTOR) 10 MG tablet, Take 1 tablet PO 4 days a week, Disp: 90 tablet, Rfl: 1   sacubitril-valsartan (ENTRESTO) 24-26 MG, Take 1 tablet by mouth 2 (two) times daily., Disp: 180 tablet, Rfl: 1  Review of Systems:  Constitutional: Denies fever, chills, diaphoresis, appetite change and fatigue.  HEENT: Denies photophobia, eye pain, redness, hearing loss, ear pain, congestion, sore throat, rhinorrhea, sneezing, mouth sores, trouble swallowing, neck pain, neck stiffness and tinnitus.   Respiratory: Denies SOB, DOE, cough, chest tightness,  and wheezing.   Cardiovascular: Denies chest pain, palpitations and leg swelling.  Gastrointestinal: Denies nausea, vomiting, abdominal pain, diarrhea, constipation, blood in stool and abdominal distention.  Genitourinary: Denies dysuria, urgency, frequency, hematuria,  flank pain and difficulty urinating.  Endocrine: Denies: hot or cold intolerance, sweats, changes in hair or nails, polyuria, polydipsia. Musculoskeletal: Denies myalgias, back pain, joint swelling, arthralgias and gait problem.  Skin: Denies pallor, rash and wound.  Neurological: Denies dizziness, seizures, syncope, weakness, light-headedness, numbness and headaches.  Hematological: Denies adenopathy. Easy bruising, personal or family bleeding history  Psychiatric/Behavioral: Denies suicidal ideation, mood changes, confusion, nervousness, sleep disturbance and agitation    Physical Exam: Vitals:   12/30/22 0955  BP: 130/72  Pulse: 67  Temp: 97.9 F (36.6 C)  TempSrc: Oral  SpO2: 98%  Weight: 225 lb (102.1 kg)    Body mass index is 35.24 kg/m.   Constitutional: NAD, calm, comfortable Eyes: PERRL, lids and conjunctivae normal ENMT: Mucous membranes are moist.  Respiratory: clear to auscultation bilaterally, no wheezing, no crackles. Normal respiratory effort. No accessory muscle use.  Cardiovascular: Regular rate and rhythm, systolic murmur best heard along the right sternal border, no rubs / gallops. No extremity edema.  Psychiatric: Normal judgment and insight. Alert and oriented x 3. Normal mood.    Impression and Plan:  History of DVT (deep vein thrombosis)  Hypertension, unspecified type  Impaired left ventricular relaxation  Hyperlipidemia, unspecified hyperlipidemia type  Chronic anticoagulation  -Notes from cardiology reviewed in detail.  She has been started on Entresto, farxiga and carvedilol.  Despite well-controlled blood pressure in office today, ambulatory measurements show relative hypotension with systolics in the 03K to low 100s.  She has been feeling a little dizzy.  She has an upcoming appointment with cardiology this week, have advised that she discuss this with them. -She has increased rosuvastatin to 4 days a week.  Tolerating well. -She remains on  Xarelto. -Advised to obtain RSV at pharmacy.  Time spent:35 minutes reviewing chart, interviewing and examining patient and formulating plan of care.     Lelon Frohlich, MD [facility]

## 2022-12-30 NOTE — Progress Notes (Signed)
Cardiology Clinic Note   Patient Name: Ann Long Date of Encounter: 01/01/2023  Primary Care Provider:  Philip Long, Ann Patricia, MD Primary Cardiologist: Dr. Royann Long  Patient Profile    Ann Long 72 year old female presents the clinic today for 1 month follow-up.  Past Medical History    Past Medical History:  Diagnosis Date   Abnormal vaginal Pap smear    Arthritis    Deep vein thrombosis (DVT) (HCC)    reports they were sporadic , right leg  to vena cava, then one in the left leg, then one in the left kidney --all were onsearate occasions    Gallbladder disease    Heart murmur    Hypertension    Jaundice    as a child, her sister was diagnosed with it due to ingestion hepatitis, she later develped januce and thpugh was not testes herself , was presumed to also have the same time of hepatitis and was subsequesntly treated with globulin '   Left bundle branch block    "my pcp told me years ago i had it , 2 -3 years ago my obgyn heard a heart murmur and sent me to [cardiology] where they did a ultrasound of my heart , they didnt find anything definitive" , denies chest pain , sob, syncope    Osteoarthritis    Osteoporosis    Phlebitis    PONV (postoperative nausea and vomiting)    Past Surgical History:  Procedure Laterality Date   ABLATION  uterine   ?1995   CHOLECYSTECTOMY  over 10 years ago   REPLACEMENT TOTAL HIP W/  RESURFACING IMPLANTS     TOTAL HIP ARTHROPLASTY Left 2009   at [facility]    TOTAL HIP ARTHROPLASTY Right 09/20/2019   Procedure: TOTAL HIP ARTHROPLASTY ANTERIOR APPROACH;  Surgeon: Ollen Gross, MD;  Location: [facility];  Service: Orthopedics;  Laterality: Right;    TUBAL LIGATION  1984    Allergies  Allergies  Allergen Reactions   Doxycycline Diarrhea   Sulfa Antibiotics Rash    History of Present Illness    [name] has a PMH of chronic combined systolic and diastolic CHF, precordial pain, shortness of breath,  left bundle branch block, recurrent DVT, obesity, and dyslipidemia.  She was seen in follow-up by Dr. Royann Long on 11/03/2022.  During that time she was noted to be NYHA class II with DOE.  She noted having shortness of breath if she had to climb a flight of stairs.  She would have to stop on a landing to catch her breath before continuing to climb stairs.  She felt that this was somewhat related to her weight.  She did note some diminished stamina.  She denied lower extremity swelling, orthopnea or PND.  She denied issues with chest discomfort.  She did note that during more intense physical activity she had some pain in her right shoulder which resolved with rest.  She denied palpitations, dizziness, presyncope, and neurological issues.  A BMP was ordered and showed stable renal and electrolytes.  Coronary CTA was ordered and showed coronary calcium score of 34.3 which placed her in the 56 percentile for age and gender suggesting intermediate risk for cardiac events.  She was noted to have mid LAD 25-49% stenosis, no plaque in her circumflex, and 70-99% ostial RCA stenosis with an FFR of 0.77 in her mid LAD.  Results were reviewed by Dr. Royann Long and it was felt that if she remained stable  medical management with lowering LDL to 70 or less would be an appropriate course of treatment.  Sherryll Burger was started 24-26 for CHF with plan to continue to uptitrate GDMT.  She presented to the clinic 12/02/2022 for follow-up evaluation and stated she had noticed some dizziness after she got out of the water aerobics and with increased physical activity since she was started carvedilol and Entresto.  We reviewed her coronary CTA and reduced ejection fraction.  She and her daughter expressed understanding.  We outlined goals for medication management and therapy.  I reviewed importance of maintaining physical activity, increasing fiber in her diet and limiting cholesterol.  I started [place] and ordered a BMP 1 week after  initiation.  Will planned follow-up in 1 month.  I also increased her rosuvastatin to 4 days/week and planned to repeat fasting lipids and LFTs  6-8 weeks.  Follow-up lab work showed an increased creatinine at 1.15 with a GFR decreased to 51.  I reduced her Marcelline Deist to 5 mg daily  She presents to the clinic today for follow-up evaluation states she feels well.  She is tolerating the increase rosuvastatin dosing.  She denies dizziness.  She does note that she is slightly fatigued until about noon after taking [place].  We will repeat her BMP today to evaluate kidney function.  She continues to maintain her 3-day a week aquarobics exercise routine.  At this time I will pause up titration of GDMT.  We will plan for repeat echocardiogram in 1 month and have her follow-up after her echocardiogram.  Today she denies chest pain, shortness of breath, lower extremity edema, fatigue, palpitations, melena, hematuria, hemoptysis, diaphoresis, weakness, presyncope, syncope, orthopnea, and PND.    Home Medications    Prior to Admission medications   Medication Sig Start Date End Date Taking? Authorizing Provider  amoxicillin (AMOXIL) 500 MG capsule Take 500 mg by mouth 2 (two) times daily. FOR DENTIST 11/17/15   [provider]  carvedilol (COREG) 3.125 MG tablet Take 1 tablet (3.125 mg total) by mouth 2 (two) times daily. 11/03/22 10/29/23  Croitoru, Mihai, MD  cholecalciferol (VITAMIN D) 1000 units tablet Take 2,000 Units by mouth daily.    [provider]  Cyanocobalamin (VITAMIN B 12 PO) Take 1,000 mcg by mouth daily in the afternoon.    [provider]  docusate sodium (COLACE) 100 MG capsule Take 100 mg by mouth daily.    [provider]  Fexofenadine HCl (ALLEGRA PO) Take 10 mg by mouth daily in the afternoon.    [provider]  fluticasone (FLONASE) 50 MCG/ACT nasal spray Place 1 spray into both nostrils daily as needed for allergies or rhinitis.    [provider]  folic acid (FOLVITE) 1 MG tablet Take 1 mg by mouth every other day.    [provider]  hydrochlorothiazide (HYDRODIURIL) 25 MG tablet TAKE 1 TABLET BY MOUTH DAILY 11/09/22   Ann Long, Ann Patricia, MD  hyoscyamine (ANASPAZ) 0.125 MG TBDP disintergrating tablet DISSOLVE 1 TABLET ON THE TONGUE THREE TIMES DAILY BEFORE MEALS AS NEEDED 10/13/22   Ann Long, Ann Patricia, MD  metoprolol tartrate (LOPRESSOR) 50 MG tablet Take 50 mg 2 hours before coronary ct 11/03/22   Croitoru, Mihai, MD  pantoprazole (PROTONIX) 40 MG tablet Take 1 tablet (40 mg total) by mouth daily as needed (acid reflux). 10/13/22   Ann Long, Ann Patricia, MD  Probiotic Product (PROBIOTIC DAILY PO) Take 1 capsule by mouth daily in the afternoon.  [provider]  psyllium (METAMUCIL) 58.6 % powder Take 1 packet by mouth daily.    [provider]  rivaroxaban (XARELTO) 10 MG TABS tablet Take 1 tablet (10 mg total) by mouth daily. 10/13/22   Ann Long, Ann Patricia, MD  rosuvastatin (CRESTOR) 5 MG tablet Take 1 tablet PO Monday, Weds, Friday 12/05/21   Koberlein, Paris Lore, MD  sacubitril-valsartan (ENTRESTO) 24-26 MG Take 1 tablet by mouth 2 (two) times daily. 11/19/22   Croitoru, Mihai, MD    Family History    Family History  Problem Relation Age of Onset   Hypertension Mother    Stroke Mother 23   Osteoporosis Mother    Heart disease Mother        in chronic a fib   Heart attack Father 78       fatal   Stroke Father 32   Diabetes Father        ?not diagnosed   Heart attack Sister    Diabetes Sister    Hypertension Sister    Kidney failure Sister    Atrial fibrillation Sister    Heart attack Sister 102   Heart disease Sister        enlarged heart   Hypertension Sister    Diabetes Sister    Heart attack Paternal Grandfather 60   Stroke Sister 85   Hypertension Sister    Atrial fibrillation Sister        multiple conversions   Diabetes Brother     Blindness Maternal Grandmother    Deafness Maternal Grandmother    Heart failure Maternal Grandmother 95   Hypertension Maternal Grandmother    Stomach cancer Maternal Grandfather    Hypertension Maternal Grandfather    Heart disease Paternal Uncle 48   Diabetes Paternal Uncle    She indicated that her mother is deceased. She indicated that her father is deceased. She indicated that two of her three sisters are alive. She indicated that her brother is alive. She indicated that her maternal grandmother is deceased. She indicated that her maternal grandfather is deceased. She indicated that her paternal grandfather is deceased. She indicated that both of her paternal uncles are deceased.  Social History    Social History   Socioeconomic History   Marital status: Widowed    Spouse name: Not on file   Number of children: Not on file   Years of education: Not on file   Highest education level: Not on file  Occupational History   Not on file  Tobacco Use   Smoking status: Never   Smokeless tobacco: Never  Substance and Sexual Activity   Alcohol use: No   Drug use: No   Sexual activity: Not on file  Other Topics Concern   Not on file  Social History Narrative   Not on file   Social Determinants of Health   Financial Resource Strain: Low Risk  (08/07/2021)   Overall Financial Resource Strain (CARDIA)    Difficulty of Paying Living Expenses: Not hard at all  Food Insecurity: No Food Insecurity (08/07/2021)   Hunger Vital Sign    Worried About Running Out of Food in the Last Year: Never true    Ran Out of Food in the Last Year: Never true  Transportation Needs: No Transportation Needs (08/07/2021)   PRAPARE - Administrator, Civil Service (Medical): No    Lack of Transportation (Non-Medical): No  Physical Activity: Inactive (08/07/2021)   Exercise Vital Sign  Days of Exercise per Week: 0 days    Minutes of Exercise per Session: 0 min  Stress: No Stress Concern  Present (08/07/2021)   [employer] of Occupational Health - Occupational Stress Questionnaire    Feeling of Stress : Not at all  Social Connections: Moderately Isolated (08/07/2021)   Social Connection and Isolation Panel [NHANES]    Frequency of Communication with Friends and Family: Three times a week    Frequency of Social Gatherings with Friends and Family: Three times a week    Attends Religious Services: More than 4 times per year    Active Member of Clubs or Organizations: No    Attends Banker Meetings: Never    Marital Status: Widowed  Intimate Partner Violence: Not At Risk (08/07/2021)   Humiliation, Afraid, Rape, and Kick questionnaire    Fear of Current or Ex-Partner: No    Emotionally Abused: No    Physically Abused: No    Sexually Abused: No     Review of Systems    General:  No chills, fever, night sweats or weight changes.  Cardiovascular:  No chest pain, dyspnea on exertion, edema, orthopnea, palpitations, paroxysmal nocturnal dyspnea. Dermatological: No rash, lesions/masses Respiratory: No cough, dyspnea Urologic: No hematuria, dysuria Abdominal:   No nausea, vomiting, diarrhea, bright red blood per rectum, melena, or hematemesis Neurologic:  No visual changes, wkns, changes in mental status. All other systems reviewed and are otherwise negative except as noted above.  Physical Exam    VS:  BP 114/65 (BP Location: Right Arm, Patient Position: Sitting, Cuff Size: Large)   Pulse 74   Ht 5\' 7"  (1.702 m)   Wt 223 lb 4.8 oz (101.3 kg)   SpO2 100%   BMI 34.97 kg/m  , BMI Body mass index is 34.97 kg/m. GEN: Well nourished, well developed, in no acute distress. HEENT: normal. Neck: Supple, no JVD, carotid bruits, or masses. Cardiac: RRR, 2/6 systolic murmur heard along right sternal border , rubs, or gallops. No clubbing, cyanosis, edema.  Radials/DP/PT 2+ and equal bilaterally.  Respiratory:  Respirations regular and unlabored, clear to  auscultation bilaterally. GI: Soft, nontender, nondistended, BS + x 4. MS: no deformity or atrophy. Skin: warm and dry, no rash. Neuro:  Strength and sensation are intact. Psych: Normal affect.  Accessory Clinical Findings    Recent Labs: 04/06/2022: ALT 13; Hemoglobin 13.9; Platelets 332.0 12/15/2022: BUN 24; Creatinine, Ser 1.15; Potassium 3.9; Sodium 138   Recent Lipid Panel    Component Value Date/Time   CHOL 144 04/06/2022 1031   TRIG 151.0 (H) 04/06/2022 1031   HDL 41.20 04/06/2022 1031   CHOLHDL 3 04/06/2022 1031   VLDL 30.2 04/06/2022 1031   LDLCALC 73 04/06/2022 1031   LDLDIRECT 110.0 03/24/2021 1101         ECG personally reviewed by me today- None today.  Echocardiogram 10/21/2022  IMPRESSIONS     1. Mild global hypokinesis worse in the anteroseptum and inferoseptum.  Left ventricular ejection fraction, by estimation, is 35 to 40%. The left  ventricle has moderately decreased function. The left ventricle  demonstrates regional wall motion  abnormalities (see scoring diagram/findings for description). There is  mild concentric left ventricular hypertrophy. Left ventricular diastolic  parameters are consistent with Grade I diastolic dysfunction (impaired  relaxation). The average left  ventricular global longitudinal strain is -18.7 %. The global longitudinal  strain is normal.   2. Right ventricular systolic function is normal. The right ventricular  size is  normal. There is normal pulmonary artery systolic pressure.   3. The mitral valve is normal in structure. Trivial mitral valve  regurgitation. No evidence of mitral stenosis.   4. The aortic valve is tricuspid. Aortic valve regurgitation is not  visualized. No aortic stenosis is present.   5. The inferior vena cava is normal in size with greater than 50%  respiratory variability, suggesting right atrial pressure of 3 mmHg.   FINDINGS   Left Ventricle: Mild global hypokinesis worse in the anteroseptum  and  inferoseptum. Left ventricular ejection fraction, by estimation, is 35 to  40%. The left ventricle has moderately decreased function. The left  ventricle demonstrates regional wall  motion abnormalities. The average left ventricular global longitudinal  strain is -18.7 %. The global longitudinal strain is normal. The left  ventricular internal cavity size was normal in size. There is mild  concentric left ventricular hypertrophy. Left  ventricular diastolic parameters are consistent with Grade I diastolic  dysfunction (impaired relaxation). Indeterminate filling pressures.   Right Ventricle: The right ventricular size is normal. No increase in  right ventricular wall thickness. Right ventricular systolic function is  normal. There is normal pulmonary artery systolic pressure. The tricuspid  regurgitant velocity is 2.68 m/s, and   with an assumed right atrial pressure of 3 mmHg, the estimated right  ventricular systolic pressure is 09.6 mmHg.   Left Atrium: Left atrial size was normal in size.   Right Atrium: Right atrial size was normal in size.   Pericardium: There is no evidence of pericardial effusion.   Mitral Valve: The mitral valve is normal in structure. Mild mitral annular  calcification. Trivial mitral valve regurgitation. No evidence of mitral  valve stenosis.   Tricuspid Valve: The tricuspid valve is normal in structure. Tricuspid  valve regurgitation is mild . No evidence of tricuspid stenosis.   Aortic Valve: The aortic valve is tricuspid. Aortic valve regurgitation is  not visualized. No aortic stenosis is present.   Pulmonic Valve: The pulmonic valve was normal in structure. Pulmonic valve  regurgitation is not visualized. No evidence of pulmonic stenosis.   Aorta: The aortic root is normal in size and structure.   Venous: The inferior vena cava is normal in size with greater than 50%  respiratory variability, suggesting right atrial pressure of 3 mmHg.    IAS/Shunts: No atrial level shunt detected by color flow Doppler.   Coronary CTA 11/19/2022  TECHNIQUE: The patient was scanned on a Siemens 045 slice scanner. Gantry rotation speed was 250 msecs. Collimation was 0.6 mm. A 100 kV prospective scan was triggered in the ascending thoracic aorta at 35-75% of the R-R interval. Average HR during the scan was 60 bpm. The 3D data set was interpreted on a dedicated work station using MPR, MIP and VRT modes. A total of 80cc of contrast was used.   FINDINGS: Non-cardiac: See separate report from [facility].   Pulmonary veins drain normally to the left atrium. No LA appendage thrombus.   Calcium Score: 34.3 Agatston units.   Coronary Arteries: Right dominant with no anomalies   LM: No plaque or stenosis.   LAD system: Calcified plaque mid LAD with mild (25-49%) stenosis.   Circumflex system: Moderate ramus, no plaque or stenosis. No plaque or stenosis in the AV LCx.   RCA system: Severe (70-99%) ostial RCA stenosis with noncalcified plaque. FFR 0.77 in mid RCA.   IMPRESSION: 1. Coronary artery calcium score 34.3 Agatston units. This places the patient in the  56th percentile for age and gender, suggesting intermediate risk for future cardiac events.   2. Visually severe ostial RCA stenosis with noncalcified plaque. FFR 0.77 in the mid RCA suggests borderline hemodynamic significance of the ostial RCA stenosis.   Dalton Mclean     Electronically Signed   By: Loralie Champagne M.D.   On: 11/19/2022 15:49  Assessment & Plan   1.  Chronic combined systolic and diastolic CHF-NYHA class II.  Denies dizziness today.  Maintaining physical activity, water aerobics 3 days/week.  EF reevaluated/reviewed by Dr. Sallyanne Kuster and he noted EF to be closer to 45%.  GDMT was recommended.  He did not feel that diuretics would be needed at this time.    Continue Entresto, carvedilol Continue Farxiga 5 mg daily Heart healthy low-sodium  diet Increase physical activity as tolerated Repeat BMP  Repeat echocardiogram in 1 month.  Coronary artery disease-denies recent episodes of chest discomfort.  Coronary CTA showed 25-49% mid LAD, 70-99% ostial RCA with 0.77 FFR and mid RCA. Continue carvedilol  Continue rosuvastatin to 4 days/week Heart healthy low-sodium diet Increase physical activity as tolerated   Hyperlipidemia-04/06/2022: Cholesterol 144; HDL 41.20; LDL Cholesterol 73; Triglycerides 151.0; VLDL 30.2.  No aspirin on Xarelto Continue rosuvastatin to 4 days/week Heart healthy low-sodium high-fiber diet Increase physical activity as tolerated Repeat fasting lipids and LFTs at follow-up visit.  History of DVT-with 4 separate episodes of DVT.  One associated with orthopedic procedure involving a total hip replacement in 2021.  Lifelong anticoagulation recommended.  Underwent evaluation by hematologist which was unrevealing.  Denies bleeding issues, compliant with Xarelto. Continue Xarelto Follows with PCP  Disposition: Follow-up with Dr. Sallyanne Kuster after echocardiogram.  Jossie Ng. Aerik Polan NP-C     01/01/2023, 9:06 AM [place] [address] Office [phone] Fax [phone]    I spent 14 minutes examining this patient, reviewing medications, and using patient centered shared decision making involving her cardiac care.  Prior to her visit I spent greater than 20 minutes reviewing her past medical history,  medications, and prior cardiac tests.

## 2023-01-01 ENCOUNTER — Encounter: Payer: Self-pay | Admitting: General Practice

## 2023-01-01 ENCOUNTER — Ambulatory Visit: Payer: Medicare Other | Attending: General Practice | Admitting: General Practice

## 2023-01-01 VITALS — BP 114/65 | HR 74 | Ht 67.0 in | Wt 223.3 lb

## 2023-01-01 DIAGNOSIS — I5042 Chronic combined systolic (congestive) and diastolic (congestive) heart failure: Secondary | ICD-10-CM

## 2023-01-01 DIAGNOSIS — E785 Hyperlipidemia, unspecified: Secondary | ICD-10-CM | POA: Diagnosis not present

## 2023-01-01 DIAGNOSIS — Z86718 Personal history of other venous thrombosis and embolism: Secondary | ICD-10-CM | POA: Diagnosis not present

## 2023-01-01 DIAGNOSIS — I2510 Atherosclerotic heart disease of native coronary artery without angina pectoris: Secondary | ICD-10-CM

## 2023-01-01 LAB — BASIC METABOLIC PANEL
BUN/Creatinine Ratio: 20 (ref 12–28)
BUN: 22 mg/dL (ref 8–27)
CO2: 25 mmol/L (ref 20–29)
Calcium: 9.8 mg/dL (ref 8.7–10.3)
Chloride: 102 mmol/L (ref 96–106)
Creatinine, Ser: 1.12 mg/dL — ABNORMAL HIGH (ref 0.57–1.00)
Glucose: 93 mg/dL (ref 70–99)
Potassium: 3.8 mmol/L (ref 3.5–5.2)
Sodium: 142 mmol/L (ref 134–144)
eGFR: 53 mL/min/{1.73_m2} — ABNORMAL LOW (ref >59)

## 2023-01-01 NOTE — Patient Instructions (Addendum)
Medication Instructions:   Your physician recommends that you continue on your current medications as directed. Please refer to the Current Medication list given to you today.  *If you need a refill on your cardiac medications before your next appointment, please call your pharmacy*  Lab Work: Your physician recommends that you return for lab work TODAY:  BMP If you have labs (blood work) drawn today and your tests are completely normal, you will receive your results only by: [place] (if you have [place]) OR A paper copy in the mail If you have any lab test that is abnormal or we need to change your treatment, we will call you to review the results.  Testing/Procedures: Your physician has requested that you have an echocardiogram. Echocardiography is a painless test that uses sound waves to create images of your heart. It provides your doctor with information about the size and shape of your heart and how well your heart's chambers and valves are working. This procedure takes approximately one hour. There are no restrictions for this procedure. Please do NOT wear cologne, perfume, aftershave, or lotions (deodorant is allowed). Please arrive 15 minutes prior to your appointment time.  Follow-Up: At [facility], you and your health needs are our priority.  As part of our continuing mission to provide you with exceptional heart care, we have created designated Provider Care Teams.  These Care Teams include your primary Cardiologist (physician) and Advanced Practice Providers (APPs -  Physician Assistants and Nurse Practitioners) who all work together to provide you with the care you need, when you need it.    Your next appointment:   3 month(s)  Provider:   Sanda Klein, MD     Other Instructions CONTINUE daily weights, physical activity and diet

## 2023-01-05 ENCOUNTER — Telehealth: Payer: Self-pay | Admitting: Cardiovascular Disease

## 2023-01-05 DIAGNOSIS — R899 Unspecified abnormal finding in specimens from other organs, systems and tissues: Secondary | ICD-10-CM

## 2023-01-05 DIAGNOSIS — R7989 Other specified abnormal findings of blood chemistry: Secondary | ICD-10-CM

## 2023-01-05 NOTE — Telephone Encounter (Signed)
Patient calling in because her kidney function is still high. Calling to see if she needs to stop her medication. Please advise

## 2023-01-05 NOTE — Telephone Encounter (Signed)
Spoke to patient . She  reviewed information from my chart. RN  went over instruction and direction again  Patient will have BMET done at [address]. Patient is aware she does not need an appointment for labs.  BMP released

## 2023-01-05 NOTE — Telephone Encounter (Signed)
Ann Pelton, NP 01/01/2023  4:45 PM EST     Please contact Ann Long and let her know that her lab work from today has been reviewed.  Her creatinine continues to be slightly elevated at 1.12.  We will discontinue her [place] and continue her other medications.  We will plan to repeat BMP during her follow-up visit after her echocardiogram.  Thank you.

## 2023-01-21 ENCOUNTER — Other Ambulatory Visit (HOSPITAL_COMMUNITY): Payer: Medicare Other

## 2023-01-22 ENCOUNTER — Other Ambulatory Visit (HOSPITAL_COMMUNITY): Payer: Medicare Other

## 2023-01-25 LAB — BASIC METABOLIC PANEL
BUN/Creatinine Ratio: 17 (ref 12–28)
BUN: 17 mg/dL (ref 8–27)
CO2: 24 mmol/L (ref 20–29)
Calcium: 10 mg/dL (ref 8.7–10.3)
Chloride: 100 mmol/L (ref 96–106)
Creatinine, Ser: 1.03 mg/dL — ABNORMAL HIGH (ref 0.57–1.00)
Glucose: 91 mg/dL (ref 70–99)
Potassium: 4.5 mmol/L (ref 3.5–5.2)
Sodium: 139 mmol/L (ref 134–144)
eGFR: 58 mL/min/{1.73_m2} — ABNORMAL LOW (ref >59)

## 2023-02-11 ENCOUNTER — Ambulatory Visit (HOSPITAL_COMMUNITY): Payer: Medicare Other | Attending: General Practice

## 2023-02-11 DIAGNOSIS — I5042 Chronic combined systolic (congestive) and diastolic (congestive) heart failure: Secondary | ICD-10-CM | POA: Diagnosis present

## 2023-02-11 LAB — ECHOCARDIOGRAM COMPLETE
Area-P 1/2: 3.48 cm2
S' Lateral: 3.1 cm

## 2023-03-08 ENCOUNTER — Other Ambulatory Visit: Payer: Self-pay | Admitting: Internal Medicine

## 2023-04-06 ENCOUNTER — Other Ambulatory Visit: Payer: Self-pay | Admitting: Internal Medicine

## 2023-04-18 NOTE — Progress Notes (Signed)
Cardiology Office Note:    Date:  04/23/2023   ID:  Ann Long, DOB [birth year], MRN [id number]  PCP:  Philip Aspen, Limmie Patricia, MD   [facility] Providers Cardiologist:  Thurmon Fair, MD     Referring MD: Philip Aspen, Estel*   No chief complaint on file. Ann Long is a 72 y.o. female who is being seen today for the evaluation of cardiomyopathy at the request of Philip Aspen, Almira Bar*.   History of Present Illness:    Ann Long is a 72 y.o. 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  Previous echo in 2017 showed EF 50-55%, same abnormal septal motion that was interpreted as septal wall motion abnormality on the current echo. GLS, PAP and E/e' are all within normal range. On my review, the reported EF is an underestimation. I think EF is approximately 45% (automated 3D EF was 44%).  There are minor valve abnormalities and mild atrial dilation.  She believes that she has had a left bundle branch block for over 20 years.  The oldest ECG I have access to is from 2017.  Coronary CT angiogram performed 11/19/2022 showed only mild single-vessel CAD, severe ostial right coronary artery stenosis with noncalcified plaque and FFR 0.77, calcium score of only 34 (56 percentile).  It was felt that this was unlikely to explain her cardiomyopathy.  We started treatment with heart failure medications but she is only able to tolerate the starting doses of carvedilol and Entresto due to bradycardia and hypotension.  She does not require loop diuretics.  She generally feels well, but still probably needs criteria for NYHA functional class II exertional dyspnea.She does not have lower extremity edema, orthopnea, PND.  A repeat echocardiogram be performed in February 2024 showed mild improvement with an EF of 40-45%, grade 1 diastolic dysfunction and mild MR.  She has aortic valve  sclerosis, without stenosis.  On increased dose of statin her LDL has decreased to 66, but her HDL remains borderline low at 42  . She does not have diabetes mellitus.  She is not a smoker.  Her dad died of a massive heart attack at age 19 and there is a strong history of diabetes mellitus on that side of the family.  She has a brother with diabetes mellitus who has had a stent in his 76s.  She has 2 sisters with atrial fibrillation.  Past Medical History:  Diagnosis Date   Abnormal vaginal Pap smear    Arthritis    Deep vein thrombosis (DVT) (HCC)    reports they were sporadic , right leg  to vena cava, then one in the left leg, then one in the left kidney --all were onsearate occasions    Gallbladder disease    Heart murmur    Hypertension    Jaundice    as a child, her sister was diagnosed with it due to ingestion hepatitis, she later develped januce and thpugh was not testes herself , was presumed to also have the same time of hepatitis and was subsequesntly treated with globulin '   Left bundle branch block    "my pcp told me years ago i had it , 2 -3 years ago my obgyn heard a heart murmur and sent me to [cardiology] where they did a ultrasound of my heart , they didnt find anything definitive" , denies chest pain , sob, syncope  Osteoarthritis    Osteoporosis    Phlebitis    PONV (postoperative nausea and vomiting)     Past Surgical History:  Procedure Laterality Date   ABLATION  uterine   ?1995   CHOLECYSTECTOMY  over 10 years ago   REPLACEMENT TOTAL HIP W/  RESURFACING IMPLANTS     TOTAL HIP ARTHROPLASTY Left 2009   at [facility]    TOTAL HIP ARTHROPLASTY Right 09/20/2019   Procedure: TOTAL HIP ARTHROPLASTY ANTERIOR APPROACH;  Surgeon: Ollen Gross, MD;  Location: [facility];  Service: Orthopedics;  Laterality: Right;    TUBAL LIGATION  1984    Current Medications: Current Meds  Medication Sig   carvedilol (COREG) 3.125 MG tablet Take 1 tablet (3.125 mg total) by  mouth 2 (two) times daily.   cholecalciferol (VITAMIN D) 1000 units tablet Take 2,000 Units by mouth daily.   Cyanocobalamin (VITAMIN B 12 PO) Take 1,000 mcg by mouth every other day.   docusate sodium (COLACE) 100 MG capsule Take 100 mg by mouth daily.   Fexofenadine HCl (ALLEGRA PO) Take 10 mg by mouth daily in the afternoon.   fluticasone (FLONASE) 50 MCG/ACT nasal spray Place 1 spray into both nostrils daily as needed for allergies or rhinitis.   folic acid (FOLVITE) 1 MG tablet Take 1 mg by mouth every other day.   hyoscyamine (ANASPAZ) 0.125 MG TBDP disintergrating tablet DISSOLVE 1 TABLET ON THE TONGUE THREE TIMES DAILY BEFORE MEALS AS NEEDED   pantoprazole (PROTONIX) 40 MG tablet Take 1 tablet (40 mg total) by mouth daily as needed (acid reflux).   Probiotic Product (PROBIOTIC DAILY PO) Take 1 capsule by mouth daily in the afternoon.   psyllium (METAMUCIL) 58.6 % powder Take 1 packet by mouth daily.   rosuvastatin (CRESTOR) 10 MG tablet Take 1 tablet PO 4 days a week   sacubitril-valsartan (ENTRESTO) 24-26 MG Take 1 tablet by mouth 2 (two) times daily.   XARELTO 10 MG TABS tablet TAKE 1 TABLET BY MOUTH DAILY   [DISCONTINUED] hydrochlorothiazide (HYDRODIURIL) 25 MG tablet TAKE 1 TABLET BY MOUTH DAILY     Allergies:   Doxycycline and Sulfa antibiotics   Social History   Socioeconomic History   Marital status: Widowed    Spouse name: Not on file   Number of children: Not on file   Years of education: Not on file   Highest education level: Not on file  Occupational History   Not on file  Tobacco Use   Smoking status: Never   Smokeless tobacco: Never  Substance and Sexual Activity   Alcohol use: No   Drug use: No   Sexual activity: Not on file  Other Topics Concern   Not on file  Social History Narrative   Not on file   Social Determinants of Health   Financial Resource Strain: Low Risk  (08/07/2021)   Overall Financial Resource Strain (CARDIA)    Difficulty of Paying  Living Expenses: Not hard at all  Food Insecurity: No Food Insecurity (08/07/2021)   Hunger Vital Sign    Worried About Running Out of Food in the Last Year: Never true    Ran Out of Food in the Last Year: Never true  Transportation Needs: No Transportation Needs (08/07/2021)   PRAPARE - Administrator, Civil Service (Medical): No    Lack of Transportation (Non-Medical): No  Physical Activity: Inactive (08/07/2021)   Exercise Vital Sign    Days of Exercise per Week: 0 days  Minutes of Exercise per Session: 0 min  Stress: No Stress Concern Present (08/07/2021)   [employer] of Occupational Health - Occupational Stress Questionnaire    Feeling of Stress : Not at all  Social Connections: Moderately Isolated (08/07/2021)   Social Connection and Isolation Panel [NHANES]    Frequency of Communication with Friends and Family: Three times a week    Frequency of Social Gatherings with Friends and Family: Three times a week    Attends Religious Services: More than 4 times per year    Active Member of Clubs or Organizations: No    Attends Banker Meetings: Never    Marital Status: Widowed     Family History: The patient's family history includes Atrial fibrillation in her sister and sister; Blindness in her maternal grandmother; Deafness in her maternal grandmother; Diabetes in her brother, father, paternal uncle, sister, and sister; Heart attack in her sister; Heart attack (age of onset: 81) in her paternal grandfather; Heart attack (age of onset: 67) in her father; Heart attack (age of onset: 61) in her sister; Heart disease in her mother and sister; Heart disease (age of onset: 25) in her paternal uncle; Heart failure (age of onset: 8) in her maternal grandmother; Hypertension in her maternal grandfather, maternal grandmother, mother, sister, sister, and sister; Kidney failure in her sister; Osteoporosis in her mother; Stomach cancer in her maternal grandfather; Stroke  (age of onset: 63) in her father; Stroke (age of onset: 29) in her sister; Stroke (age of onset: 45) in her mother.  ROS:   Please see the history of present illness.     All other systems reviewed and are negative.  EKGs/Labs/Other Studies Reviewed:    The following studies were reviewed today:   Coronary CTA11/30/2023: 1. Coronary artery calcium score 34.3 Agatston units. This places the patient in the 56th percentile for age and gender, suggesting intermediate risk for future cardiac events.   2. Visually severe ostial RCA stenosis with noncalcified plaque. FFR 0.77 in the mid RCA suggests borderline hemodynamic significance of the ostial RCA stenosis.    ECHO 02/11/2023  1. Left ventricular ejection fraction, by estimation, is 40 to 45%. The  left ventricle has mildly decreased function. The left ventricle  demonstrates global hypokinesis. Left ventricular diastolic parameters are  consistent with Grade I diastolic  dysfunction (impaired relaxation).   2. Right ventricular systolic function is normal. The right ventricular  size is normal.   3. The mitral valve is normal in structure. Mild mitral valve  regurgitation. No evidence of mitral stenosis.   4. The aortic valve is tricuspid. Aortic valve regurgitation is not  visualized. Aortic valve sclerosis/calcification is present, without any  evidence of aortic stenosis.   5. The inferior vena cava is normal in size with greater than 50%  respiratory variability, suggesting right atrial pressure of 3 mmHg.   EKG:  EKG is  ordered today.  It is very similar to previous tracings and shows sinus bradycardia 58 bpm, left bundle branch block (QRS 150 ms), left axis deviation, QTc 455 ms. LBBB was seen on ECGs as far back as 2017  Recent Labs: 01/25/2023: BUN 17; Creatinine, Ser 1.03; Potassium 4.5; Sodium 139  Recent Lipid Panel    Component Value Date/Time   CHOL 136 04/19/2023 0922   TRIG 164 (H) 04/19/2023 0922   HDL 42  04/19/2023 0922   CHOLHDL 3.2 04/19/2023 0922   CHOLHDL 3 04/06/2022 1031   VLDL 30.2 04/06/2022 1031  LDLCALC 66 04/19/2023 0922   LDLDIRECT 110.0 03/24/2021 1101     Risk Assessment/Calculations:          Physical Exam:    VS:  BP 100/70 (BP Location: Left Arm, Patient Position: Sitting, Cuff Size: Large)   Pulse (!) 58   Ht 5\' 7"  (1.702 m)   Wt 220 lb 9.6 oz (100.1 kg)   SpO2 99%   BMI 34.55 kg/m     Wt Readings from Last 3 Encounters:  04/19/23 220 lb 9.6 oz (100.1 kg)  01/01/23 223 lb 4.8 oz (101.3 kg)  12/30/22 225 lb (102.1 kg)      General: Alert, oriented x3, no distress, moderately obese Head: no evidence of trauma, PERRL, EOMI, no exophtalmos or lid lag, no myxedema, no xanthelasma; normal ears, nose and oropharynx Neck: normal jugular venous pulsations and no hepatojugular reflux; brisk carotid pulses without delay and no carotid bruits Chest: clear to auscultation, no signs of consolidation by percussion or palpation, normal fremitus, symmetrical and full respiratory excursions Cardiovascular: normal position and quality of the apical impulse, regular rhythm, normal first and paradoxically split second heart sounds, early peaking 2/6 aortic ejection murmur, no diastolic murmurs, rubs or gallops Abdomen: no tenderness or distention, no masses by palpation, no abnormal pulsatility or arterial bruits, normal bowel sounds, no hepatosplenomegaly Extremities: no clubbing, cyanosis or edema; 2+ radial, ulnar and brachial pulses bilaterally; 2+ right femoral, posterior tibial and dorsalis pedis pulses; 2+ left femoral, posterior tibial and dorsalis pedis pulses; no subclavian or femoral bruits Neurological: grossly nonfocal Psych: Normal mood and affect   ASSESSMENT:    1. Coronary artery disease of native artery of native heart with stable angina pectoris (HCC)   2. Chronic combined systolic (congestive) and diastolic (congestive) heart failure (HCC)   3.  Nonischemic cardiomyopathy (HCC)   4. LBBB (left bundle branch block)   5. Recurrent deep venous thrombosis (HCC)   6. Hypercholesterolemia   7. Severe obesity (BMI 35.0-39.9) with comorbidity (HCC)     PLAN:    In order of problems listed above:  CAD: Single vessel borderline ostial RCA stenosis, without angina pectoris.  At this point do not recommend revascularization, but this is an option if she should develop angina. CHF: Primarily nonischemic cardiomyopathy.  Her left bundle branch block has been documented for at least 7 years, probably much longer.  Part of the reduction in EF is due to LBBB related dyssynchrony.  We have reached the limits of guideline directed medical therapy due to relative bradycardia and hypotension.  In fact her blood pressure is relatively low and I think we should decrease the dose of hydrochlorothiazide.  She will call us if this leads to development of edema or dyspnea.  Consider SGLT2 inhibitor, but some concern about risk of recurrent urinary tract infections. LBBB: Longstanding conduction abnormality.  Typical LBBB with QRS greater than 150 ms.  Future option for CRT-P if her symptoms should worsen and the EF drops to less than 35%. Recurrent DVT: Has had at least 4 independent episodes of DVT of which only 1 was associated with an orthopedic procedure (total hip replacement 2021), others were spontaneous.  Plan lifelong anticoagulation.  She reports a previous hypercoagulable work-up by hematologist was unrevealing. HLP: All lipid parameters are in target range.  Chronic low HDL will improve with significant weight loss. Obesity: Weight loss would be beneficial for many points of view and would also likely improve her symptoms of shortness of breath.  Recurrent  DVT.  Borderline hypertriglyceridemia borderline low HDL.  She does not have daytime hypersomnolence.  She does not have diabetes mellitus.             Medication Adjustments/Labs and Tests  Ordered: Current medicines are reviewed at length with the patient today.  Concerns regarding medicines are outlined above.  Orders Placed This Encounter  Procedures   Lipid panel   EKG 12-Lead   ECHOCARDIOGRAM COMPLETE   Meds ordered this encounter  Medications   hydrochlorothiazide (HYDRODIURIL) 12.5 MG tablet    Sig: Take 1 tablet (12.5 mg total) by mouth daily.    Dispense:  90 tablet    Refill:  3    Please send a replace/new response with 90-Day Supply if appropriate to maximize member benefit. Requesting 1 year supply.    Patient Instructions  Medication Instructions:  Decrease HCTZ to 12.5mg  a day *If you need a refill on your cardiac medications before your next appointment, please call your pharmacy*  Fasting lipid panel today  Testing/Procedures: Your physician has requested that you have an echocardiogram in one year before 1 year follow up appointment. Echocardiography is a painless test that uses sound waves to create images of your heart. It provides your doctor with information about the size and shape of your heart and how well your heart's chambers and valves are working. This procedure takes approximately one hour. There are no restrictions for this procedure. Please do NOT wear cologne, perfume, aftershave, or lotions (deodorant is allowed). Please arrive 15 minutes prior to your appointment time.    Follow-Up: At [facility], you and your health needs are our priority.  As part of our continuing mission to provide you with exceptional heart care, we have created designated Provider Care Teams.  These Care Teams include your primary Cardiologist (physician) and Advanced Practice Providers (APPs -  Physician Assistants and Nurse Practitioners) who all work together to provide you with the care you need, when you need it.  We recommend signing up for the patient portal called "MyChart".  Sign up information is provided on this After Visit Summary.  MyChart  is used to connect with patients for Virtual Visits (Telemedicine).  Patients are able to view lab/test results, encounter notes, upcoming appointments, etc.  Non-urgent messages can be sent to your provider as well.   To learn more about what you can do with MyChart, go to [email or web address].    Your next appointment:   1 year(s)  Provider:   Thurmon Fair, MD       Signed, Thurmon Fair, MD  04/23/2023 8:36 AM     [facility]

## 2023-04-19 ENCOUNTER — Ambulatory Visit: Payer: Medicare Other | Attending: Cardiovascular Disease | Admitting: Cardiovascular Disease

## 2023-04-19 ENCOUNTER — Encounter: Payer: Self-pay | Admitting: Cardiovascular Disease

## 2023-04-19 VITALS — BP 100/70 | HR 58 | Ht 67.0 in | Wt 220.6 lb

## 2023-04-19 DIAGNOSIS — I25118 Atherosclerotic heart disease of native coronary artery with other forms of angina pectoris: Secondary | ICD-10-CM | POA: Diagnosis not present

## 2023-04-19 DIAGNOSIS — I447 Left bundle-branch block, unspecified: Secondary | ICD-10-CM | POA: Diagnosis not present

## 2023-04-19 DIAGNOSIS — I5042 Chronic combined systolic (congestive) and diastolic (congestive) heart failure: Secondary | ICD-10-CM | POA: Diagnosis not present

## 2023-04-19 DIAGNOSIS — E7800 Pure hypercholesterolemia, unspecified: Secondary | ICD-10-CM

## 2023-04-19 DIAGNOSIS — I428 Other cardiomyopathies: Secondary | ICD-10-CM

## 2023-04-19 DIAGNOSIS — I82409 Acute embolism and thrombosis of unspecified deep veins of unspecified lower extremity: Secondary | ICD-10-CM

## 2023-04-19 MED ORDER — HYDROCHLOROTHIAZIDE 12.5 MG PO TABS: 12.5000 mg | ORAL_TABLET | Freq: Every day | ORAL | 3 refills | Status: DC

## 2023-04-19 NOTE — Patient Instructions (Addendum)
Medication Instructions:  Decrease HCTZ to 12.5mg  a day *If you need a refill on your cardiac medications before your next appointment, please call your pharmacy*  Fasting lipid panel today  Testing/Procedures: Your physician has requested that you have an echocardiogram in one year before 1 year follow up appointment. Echocardiography is a painless test that uses sound waves to create images of your heart. It provides your doctor with information about the size and shape of your heart and how well your heart's chambers and valves are working. This procedure takes approximately one hour. There are no restrictions for this procedure. Please do NOT wear cologne, perfume, aftershave, or lotions (deodorant is allowed). Please arrive 15 minutes prior to your appointment time.    Follow-Up: At [facility], you and your health needs are our priority.  As part of our continuing mission to provide you with exceptional heart care, we have created designated Provider Care Teams.  These Care Teams include your primary Cardiologist (physician) and Advanced Practice Providers (APPs -  Physician Assistants and Nurse Practitioners) who all work together to provide you with the care you need, when you need it.  We recommend signing up for the patient portal called "MyChart".  Sign up information is provided on this After Visit Summary.  MyChart is used to connect with patients for Virtual Visits (Telemedicine).  Patients are able to view lab/test results, encounter notes, upcoming appointments, etc.  Non-urgent messages can be sent to your provider as well.   To learn more about what you can do with MyChart, go to [email or web address].    Your next appointment:   1 year(s)  Provider:   Thurmon Fair, MD

## 2023-04-20 LAB — LIPID PANEL
Chol/HDL Ratio: 3.2 ratio (ref 0.0–4.4)
Cholesterol, Total: 136 mg/dL (ref 100–199)
HDL: 42 mg/dL (ref >39)
LDL Chol Calc (NIH): 66 mg/dL (ref 0–99)
Triglycerides: 164 mg/dL — ABNORMAL HIGH (ref 0–149)
VLDL Cholesterol Cal: 28 mg/dL (ref 5–40)

## 2023-05-06 ENCOUNTER — Encounter (HOSPITAL_COMMUNITY): Payer: Self-pay | Admitting: Orthopedic Surgery

## 2023-05-06 ENCOUNTER — Other Ambulatory Visit: Payer: Self-pay

## 2023-05-06 NOTE — H&P (Signed)
Patient's anticipated LOS is less than 2 midnights, meeting these requirements: - Younger than 31 - Lives within 1 hour of care - Has a competent adult at home to recover with post-op recover - NO history of  - Chronic pain requiring opiods  - Diabetes  - Coronary Artery Disease  - Heart failure  - Heart attack  - Stroke  - DVT/VTE  - Cardiac arrhythmia  - Respiratory Failure/COPD  - Renal failure  - Anemia  - Advanced Liver disease     Ann Long is an 72 y.o. female.    Chief Complaint: right shoulder pain  HPI: Pt is a 72 y.o. female complaining of right shoulder pain after recent fall. Pain had continually increased since the beginning. X-rays in the clinic show displaced right proximal humerus fracture.  Various options are discussed with the patient. Risks, benefits and expectations were discussed with the patient. Patient understand the risks, benefits and expectations and wishes to proceed with surgery.   PCP:  Philip Aspen, Limmie Patricia, MD  D/C Plans: Home  PMH: Past Medical History:  Diagnosis Date   Abnormal vaginal Pap smear    Arthritis    CHF (congestive heart failure) (HCC)    Coronary artery disease    Deep vein thrombosis (DVT) (HCC)    reports they were sporadic , right leg  to vena cava, then one in the left leg, then one in the left kidney --all were onsearate occasions    Dyspnea    Gallbladder disease    GERD (gastroesophageal reflux disease)    Heart murmur    Hepatitis    Hep A as child   Hypertension    Jaundice    as a child, her sister was diagnosed with it due to ingestion hepatitis, she later develped januce and thpugh was not testes herself , was presumed to also have the same time of hepatitis and was subsequesntly treated with globulin '   Left bundle branch block    "my pcp told me years ago i had it , 2 -3 years ago my obgyn heard a heart murmur and sent me to [cardiology] where they did a ultrasound of my heart , they didnt  find anything definitive" , denies chest pain , sob, syncope    Osteoarthritis    Osteoporosis    Phlebitis    PONV (postoperative nausea and vomiting)     PSH: Past Surgical History:  Procedure Laterality Date   ABLATION  uterine   ?1995   CATARACT EXTRACTION W/ INTRAOCULAR LENS IMPLANT Bilateral    CHOLECYSTECTOMY  over 10 years ago   REPLACEMENT TOTAL HIP W/  RESURFACING IMPLANTS     TOTAL HIP ARTHROPLASTY Left 2009   at [facility]    TOTAL HIP ARTHROPLASTY Right 09/20/2019   Procedure: TOTAL HIP ARTHROPLASTY ANTERIOR APPROACH;  Surgeon: Ollen Gross, MD;  Location: [facility];  Service: Orthopedics;  Laterality: Right;    TUBAL LIGATION  1984    Social History:  reports that she has never smoked. She has never used smokeless tobacco. She reports that she does not drink alcohol and does not use drugs. BMI: Estimated body mass index is 35.24 kg/m as calculated from the following:   Height as of this encounter: 5\' 7"  (1.702 m).   Weight as of this encounter: 102.1 kg.  Lab Results  Component Value Date   ALBUMIN 4.4 04/06/2022   Diabetes: Patient does not have a diagnosis of diabetes.  Smoking Status:   reports that she has never smoked. She has never used smokeless tobacco.    Allergies:  Allergies  Allergen Reactions   Doxycycline Diarrhea   Sulfa Antibiotics Rash    Medications: No current facility-administered medications for this encounter.   Current Outpatient Medications  Medication Sig Dispense Refill   amoxicillin (AMOXIL) 500 MG capsule Take 2,000 mg by mouth See admin instructions. Take 2000 mg by mouth prior to dental appointments     carvedilol (COREG) 3.125 MG tablet Take 1 tablet (3.125 mg total) by mouth 2 (two) times daily. 180 tablet 3   Cholecalciferol (VITAMIN D) 50 MCG (2000 UT) CAPS Take 2,000 Units by mouth daily.     Cyanocobalamin (VITAMIN B 12 PO) Take 1,000 mcg by mouth daily.     docusate sodium (COLACE) 100 MG capsule Take  100 mg by mouth daily.     Fexofenadine HCl (ALLEGRA PO) Take 180 mg by mouth daily in the afternoon.     fluticasone (FLONASE) 50 MCG/ACT nasal spray Place 1 spray into both nostrils daily as needed for allergies or rhinitis.     folic acid (FOLVITE) 1 MG tablet Take 1 mg by mouth 3 (three) times a week.     hydrochlorothiazide (HYDRODIURIL) 25 MG tablet Take 25 mg by mouth every other day.     hyoscyamine (ANASPAZ) 0.125 MG TBDP disintergrating tablet DISSOLVE 1 TABLET ON THE TONGUE THREE TIMES DAILY BEFORE MEALS AS NEEDED 270 tablet 1   pantoprazole (PROTONIX) 40 MG tablet Take 1 tablet (40 mg total) by mouth daily as needed (acid reflux). 90 tablet 1   Probiotic Product (PROBIOTIC DAILY PO) Take 1 capsule by mouth daily in the afternoon.     psyllium (METAMUCIL) 58.6 % powder Take 1 packet by mouth daily.     rosuvastatin (CRESTOR) 5 MG tablet Take 5 mg by mouth 4 (four) times a week.     sacubitril-valsartan (ENTRESTO) 24-26 MG Take 1 tablet by mouth 2 (two) times daily. 180 tablet 1   XARELTO 10 MG TABS tablet TAKE 1 TABLET BY MOUTH DAILY 90 tablet 3   hydrochlorothiazide (HYDRODIURIL) 12.5 MG tablet Take 1 tablet (12.5 mg total) by mouth daily. (Patient not taking: Reported on 05/06/2023) 90 tablet 3   rosuvastatin (CRESTOR) 10 MG tablet Take 1 tablet PO 4 days a week (Patient not taking: Reported on 05/06/2023) 90 tablet 1    No results found for this or any previous visit (from the past 48 hour(s)). No results found.  ROS: Pain with rom of the right upper extremity  Physical Exam: Alert and oriented 72 y.o. female in no acute distress Cranial nerves 2-12 intact Cervical spine: full rom with no tenderness, nv intact distally Chest: active breath sounds bilaterally, no wheeze rhonchi or rales Heart: regular rate and rhythm, no murmur Abd: non tender non distended with active bowel sounds Hip is stable with rom  Right shoulder with very limited rom due to pain and guarding Moderate  edema and ecchymosis extending down the arm to the elbow No signs of open injury  Assessment/Plan Assessment: left proximal humerus fracture with displacement  Plan:  Patient will undergo a left reverse total shoulder by Dr. Ranell Patrick at [facility] Risks benefits and expectations were discussed with the patient. Patient understand risks, benefits and expectations and wishes to proceed. Preoperative templating of the joint replacement has been completed, documented, and submitted to the Operating Room personnel in order to optimize intra-operative equipment management.  Alphonsa Overall PA-C, [facility] is now [employer] [address], [place], [place] [address] Phone: [phone] [email or web address] Facebook  [employer]

## 2023-05-06 NOTE — Progress Notes (Signed)
Surgery orders requested via Epic inbox. °

## 2023-05-06 NOTE — Patient Instructions (Addendum)
SURGICAL WAITING ROOM VISITATION Patients having surgery or a procedure may have no more than 2 support people in the waiting area - these visitors may rotate.    Children under the age of 66 must have an adult with them who is not the patient.  If the patient needs to stay at the hospital during part of their recovery, the visitor guidelines for inpatient rooms apply. Pre-op nurse will coordinate an appropriate time for 1 support person to accompany patient in pre-op.  This support person may not rotate.    Please refer to the [facility] website for the visitor guidelines for Inpatients (after your surgery is over and you are in a regular room).       Your procedure is scheduled on: 05-10-23   Report to [facility] Main Entrance    Report to admitting at 1:45 PM   Call this number if you have problems the morning of surgery [phone]   Do not eat food :After Midnight.   After Midnight you may have the following liquids until 1:15 PM DAY OF SURGERY  Water Non-Citrus Juices (without pulp, NO RED-Apple, White grape, White cranberry) Black Coffee (NO MILK/CREAM OR CREAMERS, sugar ok)  Clear Tea (NO MILK/CREAM OR CREAMERS, sugar ok) regular and decaf                             Plain Jell-O (NO RED)                                           Fruit ices (not with fruit pulp, NO RED)                                     Popsicles (NO RED)                                                               Sports drinks like Gatorade (NO RED)                   The day of surgery:  Drink ONE (1) Pre-Surgery Clear Ensure at 1:15 PM the morning of surgery. Drink in one sitting. Do not sip.  This drink was given to you during your hospital  pre-op appointment visit. Nothing else to drink after completing the Pre-Surgery Clear Ensure          If you have questions, please contact your surgeon's office.   FOLLOW ANY ADDITIONAL PRE OP INSTRUCTIONS YOU RECEIVED FROM YOUR SURGEON'S  OFFICE!!!     Oral Hygiene is also important to reduce your risk of infection.                                    Remember - BRUSH YOUR TEETH THE MORNING OF SURGERY WITH YOUR REGULAR TOOTHPASTE   Do NOT smoke after Midnight   Take these medicines the morning of surgery: Carvedilol Allegra Pantoprazole Rosuvastatin Okay to use nasal spray   Hold Xarelto  and follow instructions for Lovenox bridge                              You may not have any metal on your body including hair pins, jewelry, and body piercing             Do not wear make-up, lotions, powders, perfumes or deodorant  Do not wear nail polish including gel and S&S, artificial/acrylic nails, or any other type of covering on natural nails including finger and toenails. If you have artificial nails, gel coating, etc. that needs to be removed by a nail salon please have this removed prior to surgery or surgery may need to be canceled/ delayed if the surgeon/ anesthesia feels like they are unable to be safely monitored.   Do not shave  48 hours prior to surgery.           Do not bring valuables to the hospital. [facility] IS NOT RESPONSIBLE   FOR VALUABLES.   Contacts, dentures or bridgework may not be worn into surgery.   Bring small overnight bag day of surgery.   DO NOT BRING YOUR HOME MEDICATIONS TO THE HOSPITAL. PHARMACY WILL DISPENSE MEDICATIONS LISTED ON YOUR MEDICATION LIST TO YOU DURING YOUR ADMISSION IN THE HOSPITAL!   Special Instructions: Bring a copy of your healthcare power of attorney and living will documents the day of surgery if you haven't scanned them before.              Please read over the following fact sheets you were given: IF YOU HAVE QUESTIONS ABOUT YOUR PRE-OP INSTRUCTIONS PLEASE CALL [phone]   If you received a COVID test during your pre-op visit  it is requested that you wear a mask when out in public, stay away from anyone that may not be feeling well and notify your surgeon if you  develop symptoms. If you test positive for Covid or have been in contact with anyone that has tested positive in the last 10 days please notify you surgeon.    Pre-operative 5 CHG Bath Instructions   You can play a key role in reducing the risk of infection after surgery. Your skin needs to be as free of germs as possible. You can reduce the number of germs on your skin by washing with CHG (chlorhexidine gluconate) soap before surgery. CHG is an antiseptic soap that kills germs and continues to kill germs even after washing.   DO NOT use if you have an allergy to chlorhexidine/CHG or antibacterial soaps. If your skin becomes reddened or irritated, stop using the CHG and notify one of our RNs at  [phone] .   Please shower with the CHG soap starting 4 days before surgery using the following schedule:     Please keep in mind the following:  DO NOT shave, including legs and underarms, starting the day of your first shower.   You may shave your face at any point before/day of surgery.  Place clean sheets on your bed the day you start using CHG soap. Use a clean washcloth (not used since being washed) for each shower. DO NOT sleep with pets once you start using the CHG.   CHG Shower Instructions:  If you choose to wash your hair and private area, wash first with your normal shampoo/soap.  After you use shampoo/soap, rinse your hair and body thoroughly to remove shampoo/soap residue.  Turn the water OFF and  apply about 3 tablespoons (45 ml) of CHG soap to a CLEAN washcloth.  Apply CHG soap ONLY FROM YOUR NECK DOWN TO YOUR TOES (washing for 3-5 minutes)  DO NOT use CHG soap on face, private areas, open wounds, or sores.  Pay special attention to the area where your surgery is being performed.  If you are having back surgery, having someone wash your back for you may be helpful. Wait 2 minutes after CHG soap is applied, then you may rinse off the CHG soap.  Pat dry with a clean towel  Put  on clean clothes/pajamas   If you choose to wear lotion, please use ONLY the CHG-compatible lotions on the back of this paper.     Additional instructions for the day of surgery: DO NOT APPLY any lotions, deodorants, cologne, or perfumes.   Put on clean/comfortable clothes.  Brush your teeth.  Ask your nurse before applying any prescription medications to the skin.      CHG Compatible Lotions   Aveeno Moisturizing lotion  Cetaphil Moisturizing Cream  Cetaphil Moisturizing Lotion  Clairol Herbal Essence Moisturizing Lotion, Dry Skin  Clairol Herbal Essence Moisturizing Lotion, Extra Dry Skin  Clairol Herbal Essence Moisturizing Lotion, Normal Skin  Curel Age Defying Therapeutic Moisturizing Lotion with Alpha Hydroxy  Curel Extreme Care Body Lotion  Curel Soothing Hands Moisturizing Hand Lotion  Curel Therapeutic Moisturizing Cream, Fragrance-Free  Curel Therapeutic Moisturizing Lotion, Fragrance-Free  Curel Therapeutic Moisturizing Lotion, Original Formula  Eucerin Daily Replenishing Lotion  Eucerin Dry Skin Therapy Plus Alpha Hydroxy Crme  Eucerin Dry Skin Therapy Plus Alpha Hydroxy Lotion  Eucerin Original Crme  Eucerin Original Lotion  Eucerin Plus Crme Eucerin Plus Lotion  Eucerin TriLipid Replenishing Lotion  Keri Anti-Bacterial Hand Lotion  Keri Deep Conditioning Original Lotion Dry Skin Formula Softly Scented  Keri Deep Conditioning Original Lotion, Fragrance Free Sensitive Skin Formula  Keri Lotion Fast Absorbing Fragrance Free Sensitive Skin Formula  Keri Lotion Fast Absorbing Softly Scented Dry Skin Formula  Keri Original Lotion  Keri Skin Renewal Lotion Keri Silky Smooth Lotion  Keri Silky Smooth Sensitive Skin Lotion  Nivea Body Creamy Conditioning Oil  Nivea Body Extra Enriched Lotion  Nivea Body Original Lotion  Nivea Body Sheer Moisturizing Lotion Nivea Crme  Nivea Skin Firming Lotion  NutraDerm 30 Skin Lotion  NutraDerm Skin Lotion  NutraDerm  Therapeutic Skin Cream  NutraDerm Therapeutic Skin Lotion  ProShield Protective Hand Cream  Provon moisturizing lotion     [place]- Preparing for Total Shoulder Arthroplasty    Before surgery, you can play an important role. Because skin is not sterile, your skin needs to be as free of germs as possible. You can reduce the number of germs on your skin by using the following products. Benzoyl Peroxide Gel Reduces the number of germs present on the skin Applied twice a day to shoulder area starting two days before surgery    ==================================================================  Please follow these instructions carefully:  BENZOYL PEROXIDE 5% GEL  Please do not use if you have an allergy to benzoyl peroxide.   If your skin becomes reddened/irritated stop using the benzoyl peroxide.  Starting two days before surgery, apply as follows: Apply benzoyl peroxide in the morning and at night. Apply after taking a shower. If you are not taking a shower clean entire shoulder front, back, and side along with the armpit with a clean wet washcloth.  Place a quarter-sized dollop on your shoulder and rub in thoroughly, making sure to cover  the front, back, and side of your shoulder, along with the armpit.   2 days before ____ AM   ____ PM              1 day before ____ AM   ____ PM                         Do this twice a day for two days.  (Last application is the night before surgery, AFTER using the CHG soap as described below). Do NOT apply benzoyl peroxide gel on the day of surgery.   PATIENT SIGNATURE_________________________________  NURSE SIGNATURE__________________________________    Incentive Spirometer  An incentive spirometer is a tool that can help keep your lungs clear and active. This tool measures how well you are filling your lungs with each breath. Taking long deep breaths may help reverse or decrease the chance of developing breathing (pulmonary) problems  (especially infection) following: A long period of time when you are unable to move or be active. BEFORE THE PROCEDURE  If the spirometer includes an indicator to show your best effort, your nurse or respiratory therapist will set it to a desired goal. If possible, sit up straight or lean slightly forward. Try not to slouch. Hold the incentive spirometer in an upright position. INSTRUCTIONS FOR USE  Sit on the edge of your bed if possible, or sit up as far as you can in bed or on a chair. Hold the incentive spirometer in an upright position. Breathe out normally. Place the mouthpiece in your mouth and seal your lips tightly around it. Breathe in slowly and as deeply as possible, raising the piston or the ball toward the top of the column. Hold your breath for 3-5 seconds or for as long as possible. Allow the piston or ball to fall to the bottom of the column. Remove the mouthpiece from your mouth and breathe out normally. Rest for a few seconds and repeat Steps 1 through 7 at least 10 times every 1-2 hours when you are awake. Take your time and take a few normal breaths between deep breaths. The spirometer may include an indicator to show your best effort. Use the indicator as a goal to work toward during each repetition. After each set of 10 deep breaths, practice coughing to be sure your lungs are clear. If you have an incision (the cut made at the time of surgery), support your incision when coughing by placing a pillow or rolled up towels firmly against it. Once you are able to get out of bed, walk around indoors and cough well. You may stop using the incentive spirometer when instructed by your caregiver.  RISKS AND COMPLICATIONS Take your time so you do not get dizzy or light-headed. If you are in pain, you may need to take or ask for pain medication before doing incentive spirometry. It is harder to take a deep breath if you are having pain. AFTER USE Rest and breathe slowly and  easily. It can be helpful to keep track of a log of your progress. Your caregiver can provide you with a simple table to help with this. If you are using the spirometer at home, follow these instructions: SEEK MEDICAL CARE IF:  You are having difficultly using the spirometer. You have trouble using the spirometer as often as instructed. Your pain medication is not giving enough relief while using the spirometer. You develop fever of 100.5 F (38.1 C) or higher. SEEK  IMMEDIATE MEDICAL CARE IF:  You cough up bloody sputum that had not been present before. You develop fever of 102 F (38.9 C) or greater. You develop worsening pain at or near the incision site. MAKE SURE YOU:  Understand these instructions. Will watch your condition. Will get help right away if you are not doing well or get worse. Document Released: 04/19/2007 Document Revised: 02/29/2012 Document Reviewed: 06/20/2007 [facility] Patient Information 2014 [place], [place].   ________________________________________________________________________

## 2023-05-06 NOTE — Progress Notes (Signed)
Spoke with Lynnea Ferrier scheduler with Dr. Ranell Patrick, regarding posting sheet stating L shoulder and patient states that it is the right shoulder.  Lynnea Ferrier will get the posting sheet corrected.

## 2023-05-06 NOTE — Progress Notes (Signed)
COVID Vaccine Completed:  Yes  Date of COVID positive in last 90 days:  No  PCP - Estela Philip Aspen, MD Cardiologist - Thurmon Fair, MD  Chest x-ray - N/A EKG - 04-19-23 Epic Stress Test - N/A ECHO - 02-11-23 Epic Cardiac Cath - N/A Pacemaker/ICD device last checked: Spinal Cord Stimulator: Coronary CT - 11-19-22 Epic  Bowel Prep - N/A  Sleep Study - N/A CPAP -   Fasting Blood Sugar - N/A Checks Blood Sugar _____ times a day  Last dose of GLP1 agonist-  N/A GLP1 instructions:  N/A   Last dose of SGLT-2 inhibitors-  N/A SGLT-2 instructions: N/A  Blood Thinner Instructions:  Xarelto.  Last dose 05-06-23 and then do  Lovenox bridge per patient Aspirin Instructions: Last Dose:  Activity level:  Can go up a flight of stairs and perform activities of daily living without stopping and without symptoms of chest pain.  Patient states that she has shortness of breath at times due to CHF.  States that it is mild and does not limit her from activity.  Anesthesia review:  CAD, CHF, LBBB, HTN, recurrent DVT  Patient denies shortness of breath, fever, cough and chest pain at PAT appointment (completed over the phone)  Patient verbalized understanding of instructions that were given to them at the PAT appointment. Patient was also instructed that they will need to review over the PAT instructions again at home before surgery.

## 2023-05-07 ENCOUNTER — Encounter (HOSPITAL_COMMUNITY)
Admission: RE | Admit: 2023-05-07 | Discharge: 2023-05-07 | Disposition: A | Discharge status: Home / Self Care | Payer: Medicare Other | Source: Ambulatory Visit | Attending: Orthopedic Surgery | Admitting: Orthopedic Surgery

## 2023-05-07 DIAGNOSIS — Z01818 Encounter for other preprocedural examination: Secondary | ICD-10-CM

## 2023-05-07 DIAGNOSIS — K759 Inflammatory liver disease, unspecified: Secondary | ICD-10-CM | POA: Insufficient documentation

## 2023-05-07 LAB — CBC
HCT: 35.7 % — ABNORMAL LOW (ref 36.0–46.0)
Hemoglobin: 12.1 g/dL (ref 12.0–15.0)
MCH: 32.1 pg (ref 26.0–34.0)
MCHC: 33.9 g/dL (ref 30.0–36.0)
MCV: 94.7 fL (ref 80.0–100.0)
Platelets: 282 10*3/uL (ref 150–400)
RBC: 3.77 MIL/uL — ABNORMAL LOW (ref 3.87–5.11)
RDW: 13.2 % (ref 11.5–15.5)
WBC: 10.3 10*3/uL (ref 4.0–10.5)
nRBC: 0 % (ref 0.0–0.2)

## 2023-05-07 LAB — COMPREHENSIVE METABOLIC PANEL
ALT: 12 U/L (ref 0–44)
AST: 16 U/L (ref 15–41)
Albumin: 3.9 g/dL (ref 3.5–5.0)
Alkaline Phosphatase: 47 U/L (ref 38–126)
Anion gap: 9 (ref 5–15)
BUN: 22 mg/dL (ref 8–23)
CO2: 24 mmol/L (ref 22–32)
Calcium: 8.7 mg/dL — ABNORMAL LOW (ref 8.9–10.3)
Chloride: 101 mmol/L (ref 98–111)
Creatinine, Ser: 1.01 mg/dL — ABNORMAL HIGH (ref 0.44–1.00)
GFR, Estimated: 60 mL/min — ABNORMAL LOW (ref >60)
Glucose, Bld: 141 mg/dL — ABNORMAL HIGH (ref 70–99)
Potassium: 3 mmol/L — ABNORMAL LOW (ref 3.5–5.1)
Sodium: 134 mmol/L — ABNORMAL LOW (ref 135–145)
Total Bilirubin: 1 mg/dL (ref 0.3–1.2)
Total Protein: 7.1 g/dL (ref 6.5–8.1)

## 2023-05-07 LAB — SURGICAL PCR SCREEN
MRSA, PCR: NEGATIVE
Staphylococcus aureus: POSITIVE — AB

## 2023-05-07 NOTE — Anesthesia Preprocedure Evaluation (Addendum)
Anesthesia Evaluation  Patient identified by MRN, date of birth, ID band Patient awake    Reviewed: Allergy & Precautions, NPO status , Patient's Chart, lab work & pertinent test results  History of Anesthesia Complications (+) PONV and history of anesthetic complications  Airway Mallampati: II  TM Distance: >3 FB Neck ROM: Full    Dental no notable dental hx. (+) Dental Advisory Given   Pulmonary shortness of breath and with exertion, PE   Pulmonary exam normal breath sounds clear to auscultation       Cardiovascular hypertension, Pt. on medications + CAD, +CHF, + DOE and + DVT  + dysrhythmias + Valvular Problems/Murmurs  Rhythm:Regular Rate:Normal + Systolic murmurs Echo 01/2023  1. Left ventricular ejection fraction, by estimation, is 40 to 45%. The left ventricle has mildly decreased function. The left ventricle demonstrates global hypokinesis. Left ventricular diastolic parameters are consistent with Grade I diastolic dysfunction (impaired relaxation).   2. Right ventricular systolic function is normal. The right ventricular size is normal.   3. The mitral valve is normal in structure. Mild mitral valve regurgitation. No evidence of mitral stenosis.   4. The aortic valve is tricuspid. Aortic valve regurgitation is not visualized. Aortic valve sclerosis/calcification is present, without any evidence of aortic stenosis.   5. The inferior vena cava is normal in size with greater than 50% respiratory variability, suggesting right atrial pressure of 3 mmHg.    CT coronary ca score 10/2022 1. Coronary artery calcium score 34.3 Agatston units. This places the patient in the 56th percentile for age and gender, suggesting intermediate risk for future cardiac events.   2. Visually severe ostial RCA stenosis with noncalcified plaque. FFR 0.77 in the mid RCA suggests borderline hemodynamic significance of the ostial RCA stenosis.      Neuro/Psych negative neurological ROS  negative psych ROS   GI/Hepatic ,GERD  ,,(+) Hepatitis -  Endo/Other  negative endocrine ROS    Renal/GU negative Renal ROS     Musculoskeletal  (+) Arthritis , Osteoarthritis,    Abdominal  (+) + obese  Peds  Hematology negative hematology ROS (+)   Anesthesia Other Findings   Reproductive/Obstetrics                              Anesthesia Physical Anesthesia Plan  ASA: 4  Anesthesia Plan: General   Post-op Pain Management: Regional block* and Tylenol PO (pre-op)*   Induction: Intravenous  PONV Risk Score and Plan: 3 and Ondansetron, Dexamethasone, Midazolam and Treatment may vary due to age or medical condition  Airway Management Planned: Oral ETT  Additional Equipment: ClearSight  Intra-op Plan:   Post-operative Plan: Extubation in OR  Informed Consent: I have reviewed the patients History and Physical, chart, labs and discussed the procedure including the risks, benefits and alternatives for the proposed anesthesia with the patient or authorized representative who has indicated his/her understanding and acceptance.     Dental advisory given  Plan Discussed with: CRNA  Anesthesia Plan Comments: (Pt having some numbness and tingling in her R hand. Subjectively she says some weakness too. Dr. Ranell Patrick specifically requested exparel interscalene block .I discussed this with Dr. Ranell Patrick who feels her symptoms are merely from swelling. I discussed in detail the risks of nerve injury with block and the possibility worsened weakness post op. I explained that nerve injury would most likely be due to the fracture and repair. The patient expressed understanding and wishes to  proceed with block.  See PAT note from 5/17 by Sherlie Ban PA-C)         Anesthesia Quick Evaluation

## 2023-05-07 NOTE — Progress Notes (Signed)
Case: [id number] Date/Time: 05/10/23 1600   Procedure: REVERSE SHOULDER ARTHROPLASTY (Right: Shoulder) - interscalene block 110   Anesthesia type: Choice   Pre-op diagnosis: Right proximal humerus fracture   Location: [facility] ROOM 10 / [facility]   Surgeons: Beverely Low, MD       DISCUSSION: Ann Long is a 72 year old female who presents for same-day workup prior to surgery listed above.  Patient had a recent fall and she was found to have a displaced right proximal humerus fracture. Prior anesthesia complications include PONV.  Other past medical history significant for coronary artery disease, non-ischemic cardiomyopathy, known left bundle branch block, recurrent DVT on chronic anticoagulation, obesity.  Patient was last evaluated by cardiology on 04/19/2023.  Coronary artery disease is diagnosed on CT scan and is being medically managed.  Patient denied any chest pain or shortness of breath over PAT telephone visit.  Her CHF symptoms were stable.  Blood pressure is controlled.  She is chronically anticoagulated and will be undergoing a Lovenox bridge.  Discussed with Dr. Glade Stanford who advises to proceed due to urgency of case.  VS: BP (!) 142/81 (BP Location: Left Arm)   Pulse 84   Temp 36.5 C (Oral)   Resp 18   Ht 5\' 7"  (1.702 m)   Wt 102.4 kg   SpO2 96%   BMI 35.35 kg/m   PROVIDERS: Philip Aspen, Limmie Patricia, MD Cardiologist: Dr. Rachelle Hora Croitoru  LABS: Labs reviewed: Repeat BMP DOS due to hypokalemia (all labs ordered are listed, but only abnormal results are displayed)  Labs Reviewed  COMPREHENSIVE METABOLIC PANEL - Abnormal; Notable for the following components:      Result Value   Sodium 134 (*)    Potassium 3.0 (*)    Glucose, Bld 141 (*)    Creatinine, Ser 1.01 (*)    Calcium 8.7 (*)    GFR, Estimated 60 (*)    All other components within normal limits  CBC - Abnormal; Notable for the following components:   RBC 3.77 (*)    HCT 35.7 (*)    All other  components within normal limits  SURGICAL PCR SCREEN     IMAGES:  CT coronary 11/19/22:  IMPRESSION: 1.  Ostial RCA stenosis of borderline hemodynamic significance.   EKG 04/19/23:  Sinus bradycardia LAD LBBB Appears similar to prior  CV:  Echo 02/11/23:  IMPRESSIONS     1. Left ventricular ejection fraction, by estimation, is 40 to 45%. The  left ventricle has mildly decreased function. The left ventricle  demonstrates global hypokinesis. Left ventricular diastolic parameters are  consistent with Grade I diastolic  dysfunction (impaired relaxation).   2. Right ventricular systolic function is normal. The right ventricular  size is normal.   3. The mitral valve is normal in structure. Mild mitral valve  regurgitation. No evidence of mitral stenosis.   4. The aortic valve is tricuspid. Aortic valve regurgitation is not  visualized. Aortic valve sclerosis/calcification is present, without any  evidence of aortic stenosis.   5. The inferior vena cava is normal in size with greater than 50%  respiratory variability, suggesting right atrial pressure of 3 mmHg.    Past Medical History:  Diagnosis Date   Abnormal vaginal Pap smear    Arthritis    CHF (congestive heart failure) (HCC)    Coronary artery disease    Deep vein thrombosis (DVT) (HCC)    reports they were sporadic , right leg  to vena cava, then one in the  left leg, then one in the left kidney --all were onsearate occasions    Dyspnea    Gallbladder disease    GERD (gastroesophageal reflux disease)    Heart murmur    Hepatitis    Hep A as child   Hypertension    Jaundice    as a child, her sister was diagnosed with it due to ingestion hepatitis, she later develped januce and thpugh was not testes herself , was presumed to also have the same time of hepatitis and was subsequesntly treated with globulin '   Left bundle branch block    "my pcp told me years ago i had it , 2 -3 years ago my obgyn heard a  heart murmur and sent me to [cardiology] where they did a ultrasound of my heart , they didnt find anything definitive" , denies chest pain , sob, syncope    Osteoarthritis    Osteoporosis    Phlebitis    PONV (postoperative nausea and vomiting)     Past Surgical History:  Procedure Laterality Date   ABLATION  uterine   ?1995   CATARACT EXTRACTION W/ INTRAOCULAR LENS IMPLANT Bilateral    CHOLECYSTECTOMY  over 10 years ago   REPLACEMENT TOTAL HIP W/  RESURFACING IMPLANTS     TOTAL HIP ARTHROPLASTY Left 2009   at [facility]    TOTAL HIP ARTHROPLASTY Right 09/20/2019   Procedure: TOTAL HIP ARTHROPLASTY ANTERIOR APPROACH;  Surgeon: Ollen Gross, MD;  Location: [facility];  Service: Orthopedics;  Laterality: Right;    TUBAL LIGATION  1984    MEDICATIONS:  amoxicillin (AMOXIL) 500 MG capsule   carvedilol (COREG) 3.125 MG tablet   Cholecalciferol (VITAMIN D) 50 MCG (2000 UT) CAPS   Cyanocobalamin (VITAMIN B 12 PO)   docusate sodium (COLACE) 100 MG capsule   Fexofenadine HCl (ALLEGRA PO)   fluticasone (FLONASE) 50 MCG/ACT nasal spray   folic acid (FOLVITE) 1 MG tablet   hydrochlorothiazide (HYDRODIURIL) 12.5 MG tablet   hydrochlorothiazide (HYDRODIURIL) 25 MG tablet   hyoscyamine (ANASPAZ) 0.125 MG TBDP disintergrating tablet   pantoprazole (PROTONIX) 40 MG tablet   Probiotic Product (PROBIOTIC DAILY PO)   psyllium (METAMUCIL) 58.6 % powder   rosuvastatin (CRESTOR) 10 MG tablet   rosuvastatin (CRESTOR) 5 MG tablet   sacubitril-valsartan (ENTRESTO) 24-26 MG   XARELTO 10 MG TABS tablet   No current facility-administered medications for this encounter.   Marcille Blanco [facility] Short Stay/Anesthesiology [facility] Phone [phone] 05/07/2023 11:31 AM

## 2023-05-07 NOTE — Progress Notes (Signed)
PCR results 05/07/23 faxed to Dr. Ranell Patrick and T. Dixon P.A. via [facility].

## 2023-05-10 ENCOUNTER — Ambulatory Visit (HOSPITAL_COMMUNITY): Payer: Medicare Other | Admitting: Medical

## 2023-05-10 ENCOUNTER — Inpatient Hospital Stay (HOSPITAL_COMMUNITY): Payer: Medicare Other

## 2023-05-10 ENCOUNTER — Other Ambulatory Visit: Payer: Self-pay

## 2023-05-10 ENCOUNTER — Encounter (HOSPITAL_COMMUNITY)
Admission: RE | Disposition: A | Discharge status: Home / Self Care | Payer: Self-pay | Source: Home / Self Care | Attending: Orthopedic Surgery

## 2023-05-10 ENCOUNTER — Inpatient Hospital Stay (HOSPITAL_COMMUNITY)
Admission: RE | Admit: 2023-05-10 | Discharge: 2023-05-12 | DRG: 483 | Disposition: A | Discharge status: Home / Self Care | Payer: Medicare Other | Attending: Orthopedic Surgery | Admitting: Orthopedic Surgery

## 2023-05-10 ENCOUNTER — Encounter (HOSPITAL_COMMUNITY): Payer: Self-pay | Admitting: Orthopedic Surgery

## 2023-05-10 DIAGNOSIS — Z7901 Long term (current) use of anticoagulants: Secondary | ICD-10-CM | POA: Diagnosis not present

## 2023-05-10 DIAGNOSIS — E876 Hypokalemia: Secondary | ICD-10-CM

## 2023-05-10 DIAGNOSIS — Z6835 Body mass index (BMI) 35.0-35.9, adult: Secondary | ICD-10-CM

## 2023-05-10 DIAGNOSIS — I509 Heart failure, unspecified: Secondary | ICD-10-CM | POA: Diagnosis not present

## 2023-05-10 DIAGNOSIS — K219 Gastro-esophageal reflux disease without esophagitis: Secondary | ICD-10-CM | POA: Diagnosis present

## 2023-05-10 DIAGNOSIS — Z882 Allergy status to sulfonamides status: Secondary | ICD-10-CM

## 2023-05-10 DIAGNOSIS — Z9841 Cataract extraction status, right eye: Secondary | ICD-10-CM | POA: Diagnosis not present

## 2023-05-10 DIAGNOSIS — Z8672 Personal history of thrombophlebitis: Secondary | ICD-10-CM | POA: Diagnosis not present

## 2023-05-10 DIAGNOSIS — Z961 Presence of intraocular lens: Secondary | ICD-10-CM | POA: Diagnosis present

## 2023-05-10 DIAGNOSIS — I080 Rheumatic disorders of both mitral and aortic valves: Secondary | ICD-10-CM | POA: Diagnosis present

## 2023-05-10 DIAGNOSIS — D62 Acute posthemorrhagic anemia: Secondary | ICD-10-CM | POA: Diagnosis not present

## 2023-05-10 DIAGNOSIS — Z96611 Presence of right artificial shoulder joint: Secondary | ICD-10-CM | POA: Diagnosis present

## 2023-05-10 DIAGNOSIS — I5032 Chronic diastolic (congestive) heart failure: Secondary | ICD-10-CM | POA: Diagnosis present

## 2023-05-10 DIAGNOSIS — Z881 Allergy status to other antibiotic agents status: Secondary | ICD-10-CM | POA: Diagnosis not present

## 2023-05-10 DIAGNOSIS — Z9049 Acquired absence of other specified parts of digestive tract: Secondary | ICD-10-CM | POA: Diagnosis not present

## 2023-05-10 DIAGNOSIS — I110 Hypertensive heart disease with heart failure: Secondary | ICD-10-CM

## 2023-05-10 DIAGNOSIS — I428 Other cardiomyopathies: Secondary | ICD-10-CM | POA: Diagnosis present

## 2023-05-10 DIAGNOSIS — M810 Age-related osteoporosis without current pathological fracture: Secondary | ICD-10-CM | POA: Diagnosis present

## 2023-05-10 DIAGNOSIS — Z9842 Cataract extraction status, left eye: Secondary | ICD-10-CM

## 2023-05-10 DIAGNOSIS — Z79899 Other long term (current) drug therapy: Secondary | ICD-10-CM | POA: Diagnosis not present

## 2023-05-10 DIAGNOSIS — E669 Obesity, unspecified: Secondary | ICD-10-CM | POA: Diagnosis present

## 2023-05-10 DIAGNOSIS — W19XXXA Unspecified fall, initial encounter: Secondary | ICD-10-CM | POA: Diagnosis present

## 2023-05-10 DIAGNOSIS — M19011 Primary osteoarthritis, right shoulder: Secondary | ICD-10-CM | POA: Diagnosis present

## 2023-05-10 DIAGNOSIS — I2510 Atherosclerotic heart disease of native coronary artery without angina pectoris: Secondary | ICD-10-CM | POA: Diagnosis present

## 2023-05-10 DIAGNOSIS — S42291A Other displaced fracture of upper end of right humerus, initial encounter for closed fracture: Principal | ICD-10-CM | POA: Diagnosis present

## 2023-05-10 DIAGNOSIS — Z86718 Personal history of other venous thrombosis and embolism: Secondary | ICD-10-CM | POA: Diagnosis not present

## 2023-05-10 DIAGNOSIS — S42201A Unspecified fracture of upper end of right humerus, initial encounter for closed fracture: Secondary | ICD-10-CM | POA: Diagnosis not present

## 2023-05-10 DIAGNOSIS — Z96643 Presence of artificial hip joint, bilateral: Secondary | ICD-10-CM | POA: Diagnosis present

## 2023-05-10 DIAGNOSIS — Z01818 Encounter for other preprocedural examination: Principal | ICD-10-CM

## 2023-05-10 DIAGNOSIS — E1165 Type 2 diabetes mellitus with hyperglycemia: Secondary | ICD-10-CM | POA: Diagnosis present

## 2023-05-10 DIAGNOSIS — K759 Inflammatory liver disease, unspecified: Secondary | ICD-10-CM

## 2023-05-10 HISTORY — DX: Heart failure, unspecified: I50.9

## 2023-05-10 HISTORY — DX: Atherosclerotic heart disease of native coronary artery without angina pectoris: I25.10

## 2023-05-10 HISTORY — PX: REVERSE SHOULDER ARTHROPLASTY: SHX5054

## 2023-05-10 HISTORY — DX: Gastro-esophageal reflux disease without esophagitis: K21.9

## 2023-05-10 HISTORY — DX: Inflammatory liver disease, unspecified: K75.9

## 2023-05-10 HISTORY — DX: Dyspnea, unspecified: R06.00

## 2023-05-10 LAB — HEMOGLOBIN AND HEMATOCRIT, BLOOD
HCT: 19.1 % — ABNORMAL LOW (ref 36.0–46.0)
Hemoglobin: 6.2 g/dL — CL (ref 12.0–15.0)

## 2023-05-10 LAB — TYPE AND SCREEN
ABO/RH(D): B POS
Unit division: 0
Unit division: 0

## 2023-05-10 LAB — BASIC METABOLIC PANEL
Anion gap: 8 (ref 5–15)
BUN: 22 mg/dL (ref 8–23)
CO2: 26 mmol/L (ref 22–32)
Calcium: 8.6 mg/dL — ABNORMAL LOW (ref 8.9–10.3)
Chloride: 100 mmol/L (ref 98–111)
Creatinine, Ser: 0.94 mg/dL (ref 0.44–1.00)
GFR, Estimated: >60 mL/min (ref >60)
Glucose, Bld: 119 mg/dL — ABNORMAL HIGH (ref 70–99)
Potassium: 3 mmol/L — ABNORMAL LOW (ref 3.5–5.1)
Sodium: 134 mmol/L — ABNORMAL LOW (ref 135–145)

## 2023-05-10 LAB — BPAM RBC
Blood Product Expiration Date: 202406152359
Unit Type and Rh: 1700

## 2023-05-10 LAB — PREPARE RBC (CROSSMATCH)

## 2023-05-10 SURGERY — ARTHROPLASTY, SHOULDER, TOTAL, REVERSE
Anesthesia: General | Site: Shoulder | Laterality: Right

## 2023-05-10 MED ORDER — EPHEDRINE SULFATE-NACL 50-0.9 MG/10ML-% IV SOSY
PREFILLED_SYRINGE | INTRAVENOUS | Status: DC | PRN
  Administered 2023-05-10 (×4): 5 mg via INTRAVENOUS

## 2023-05-10 MED ORDER — STERILE WATER FOR IRRIGATION IR SOLN
Status: DC | PRN
  Administered 2023-05-10: 2000 mL

## 2023-05-10 MED ORDER — METOCLOPRAMIDE HCL 5 MG PO TABS: 5.0000 mg | ORAL_TABLET | Freq: Three times a day (TID) | ORAL | Status: DC | PRN

## 2023-05-10 MED ORDER — FENTANYL CITRATE PF 50 MCG/ML IJ SOSY
50.0000 ug | PREFILLED_SYRINGE | Freq: Once | INTRAMUSCULAR | Status: AC
  Administered 2023-05-10: 50 ug via INTRAVENOUS
  Filled 2023-05-10: qty 1

## 2023-05-10 MED ORDER — SACUBITRIL-VALSARTAN 24-26 MG PO TABS
1.0000 | ORAL_TABLET | Freq: Two times a day (BID) | ORAL | Status: DC
  Administered 2023-05-10 – 2023-05-12 (×4): 1 via ORAL
  Filled 2023-05-10 (×5): qty 1

## 2023-05-10 MED ORDER — HYOSCYAMINE SULFATE 0.125 MG SL SUBL: 0.1250 mg | SUBLINGUAL_TABLET | Freq: Four times a day (QID) | SUBLINGUAL | Status: DC | PRN

## 2023-05-10 MED ORDER — ONDANSETRON HCL 4 MG/2ML IJ SOLN: 4.0000 mg | Freq: Four times a day (QID) | INTRAMUSCULAR | Status: DC | PRN

## 2023-05-10 MED ORDER — METOCLOPRAMIDE HCL 5 MG/ML IJ SOLN: 5.0000 mg | Freq: Three times a day (TID) | INTRAMUSCULAR | Status: DC | PRN

## 2023-05-10 MED ORDER — METHOCARBAMOL 500 MG PO TABS: 500.0000 mg | ORAL_TABLET | Freq: Three times a day (TID) | ORAL | 1 refills | Status: DC

## 2023-05-10 MED ORDER — POLYETHYLENE GLYCOL 3350 17 G PO PACK: 17.0000 g | PACK | Freq: Every day | ORAL | Status: DC | PRN

## 2023-05-10 MED ORDER — PANTOPRAZOLE SODIUM 40 MG PO TBEC: 40.0000 mg | DELAYED_RELEASE_TABLET | Freq: Every day | ORAL | Status: DC | PRN

## 2023-05-10 MED ORDER — PHENYLEPHRINE 80 MCG/ML (10ML) SYRINGE FOR IV PUSH (FOR BLOOD PRESSURE SUPPORT)
PREFILLED_SYRINGE | INTRAVENOUS | Status: DC | PRN
  Administered 2023-05-10 (×2): 80 ug via INTRAVENOUS

## 2023-05-10 MED ORDER — PROPOFOL 10 MG/ML IV BOLUS
INTRAVENOUS | Status: DC | PRN
  Administered 2023-05-10: 120 mg via INTRAVENOUS

## 2023-05-10 MED ORDER — VITAMIN D 25 MCG (1000 UNIT) PO TABS
2000.0000 [IU] | ORAL_TABLET | Freq: Every day | ORAL | Status: DC
  Administered 2023-05-11 – 2023-05-12 (×2): 2000 [IU] via ORAL
  Filled 2023-05-10 (×2): qty 2

## 2023-05-10 MED ORDER — EPINEPHRINE PF 1 MG/ML IJ SOLN
INTRAMUSCULAR | Status: AC
  Filled 2023-05-10: qty 1

## 2023-05-10 MED ORDER — LIDOCAINE 2% (20 MG/ML) 5 ML SYRINGE
INTRAMUSCULAR | Status: DC | PRN
  Administered 2023-05-10: 80 mg via INTRAVENOUS

## 2023-05-10 MED ORDER — LIDOCAINE HCL (PF) 2 % IJ SOLN
INTRAMUSCULAR | Status: AC
  Filled 2023-05-10: qty 5

## 2023-05-10 MED ORDER — LACTATED RINGERS IV SOLN: INTRAVENOUS | Status: DC

## 2023-05-10 MED ORDER — ONDANSETRON HCL 4 MG PO TABS: 4.0000 mg | ORAL_TABLET | Freq: Four times a day (QID) | ORAL | Status: DC | PRN

## 2023-05-10 MED ORDER — RIVAROXABAN 10 MG PO TABS
10.0000 mg | ORAL_TABLET | Freq: Every day | ORAL | Status: DC
  Administered 2023-05-11: 10 mg via ORAL
  Filled 2023-05-10: qty 1

## 2023-05-10 MED ORDER — HYDROCHLOROTHIAZIDE 12.5 MG PO TABS
12.5000 mg | ORAL_TABLET | Freq: Every day | ORAL | Status: DC
  Administered 2023-05-11 – 2023-05-12 (×2): 12.5 mg via ORAL
  Filled 2023-05-10 (×2): qty 1

## 2023-05-10 MED ORDER — HYDROMORPHONE HCL 1 MG/ML IJ SOLN: 0.2500 mg | INTRAMUSCULAR | Status: DC | PRN

## 2023-05-10 MED ORDER — ACETAMINOPHEN 325 MG PO TABS
325.0000 mg | ORAL_TABLET | Freq: Four times a day (QID) | ORAL | Status: DC | PRN
  Administered 2023-05-11: 650 mg via ORAL
  Filled 2023-05-10: qty 2

## 2023-05-10 MED ORDER — DOCUSATE SODIUM 100 MG PO CAPS
100.0000 mg | ORAL_CAPSULE | Freq: Two times a day (BID) | ORAL | Status: DC
  Administered 2023-05-10 – 2023-05-12 (×4): 100 mg via ORAL
  Filled 2023-05-10 (×4): qty 1

## 2023-05-10 MED ORDER — FLUTICASONE PROPIONATE 50 MCG/ACT NA SUSP: 1.0000 | Freq: Every day | NASAL | Status: DC | PRN

## 2023-05-10 MED ORDER — PROPOFOL 10 MG/ML IV BOLUS
INTRAVENOUS | Status: AC
  Filled 2023-05-10: qty 20

## 2023-05-10 MED ORDER — BUPIVACAINE-EPINEPHRINE (PF) 0.25% -1:200000 IJ SOLN
INTRAMUSCULAR | Status: DC | PRN
  Administered 2023-05-10: 19 mL

## 2023-05-10 MED ORDER — SUGAMMADEX SODIUM 200 MG/2ML IV SOLN
INTRAVENOUS | Status: DC | PRN
  Administered 2023-05-10: 200 mg via INTRAVENOUS

## 2023-05-10 MED ORDER — SODIUM CHLORIDE 0.9 % IV SOLN: INTRAVENOUS | Status: DC

## 2023-05-10 MED ORDER — HYDROMORPHONE HCL 1 MG/ML IJ SOLN: 0.5000 mg | INTRAMUSCULAR | Status: DC | PRN

## 2023-05-10 MED ORDER — ROCURONIUM BROMIDE 10 MG/ML (PF) SYRINGE
PREFILLED_SYRINGE | INTRAVENOUS | Status: AC
  Filled 2023-05-10: qty 10

## 2023-05-10 MED ORDER — FENTANYL CITRATE (PF) 100 MCG/2ML IJ SOLN
INTRAMUSCULAR | Status: AC
  Filled 2023-05-10: qty 2

## 2023-05-10 MED ORDER — PHENOL 1.4 % MT LIQD: 1.0000 | OROMUCOSAL | Status: DC | PRN

## 2023-05-10 MED ORDER — METHOCARBAMOL 500 MG PO TABS
500.0000 mg | ORAL_TABLET | Freq: Four times a day (QID) | ORAL | Status: DC | PRN
  Administered 2023-05-11: 500 mg via ORAL
  Filled 2023-05-10: qty 1

## 2023-05-10 MED ORDER — METHOCARBAMOL 1000 MG/10ML IJ SOLN: 500.0000 mg | Freq: Four times a day (QID) | INTRAVENOUS | Status: DC | PRN

## 2023-05-10 MED ORDER — ROCURONIUM BROMIDE 10 MG/ML (PF) SYRINGE
PREFILLED_SYRINGE | INTRAVENOUS | Status: DC | PRN
  Administered 2023-05-10: 60 mg via INTRAVENOUS

## 2023-05-10 MED ORDER — CEFAZOLIN SODIUM-DEXTROSE 2-4 GM/100ML-% IV SOLN
2.0000 g | Freq: Four times a day (QID) | INTRAVENOUS | Status: AC
  Administered 2023-05-11 (×3): 2 g via INTRAVENOUS
  Filled 2023-05-10 (×3): qty 100

## 2023-05-10 MED ORDER — HYOSCYAMINE SULFATE 0.125 MG PO TBDP: 0.1250 mg | ORAL_TABLET | Freq: Four times a day (QID) | ORAL | Status: DC | PRN

## 2023-05-10 MED ORDER — BUPIVACAINE HCL (PF) 0.5 % IJ SOLN
INTRAMUSCULAR | Status: DC | PRN
  Administered 2023-05-10: 10 mL via PERINEURAL

## 2023-05-10 MED ORDER — DEXAMETHASONE SODIUM PHOSPHATE 10 MG/ML IJ SOLN
INTRAMUSCULAR | Status: DC | PRN
  Administered 2023-05-10: 8 mg via INTRAVENOUS

## 2023-05-10 MED ORDER — PHENYLEPHRINE HCL-NACL 20-0.9 MG/250ML-% IV SOLN
INTRAVENOUS | Status: DC | PRN
  Administered 2023-05-10: 40 ug/min via INTRAVENOUS

## 2023-05-10 MED ORDER — CEFAZOLIN SODIUM-DEXTROSE 2-4 GM/100ML-% IV SOLN
2.0000 g | INTRAVENOUS | Status: AC
  Administered 2023-05-10: 2 g via INTRAVENOUS
  Filled 2023-05-10: qty 100

## 2023-05-10 MED ORDER — DEXAMETHASONE SODIUM PHOSPHATE 10 MG/ML IJ SOLN
INTRAMUSCULAR | Status: AC
  Filled 2023-05-10: qty 1

## 2023-05-10 MED ORDER — ORAL CARE MOUTH RINSE: 15.0000 mL | Freq: Once | OROMUCOSAL | Status: AC

## 2023-05-10 MED ORDER — PHENYLEPHRINE 80 MCG/ML (10ML) SYRINGE FOR IV PUSH (FOR BLOOD PRESSURE SUPPORT)
PREFILLED_SYRINGE | INTRAVENOUS | Status: AC
  Filled 2023-05-10: qty 10

## 2023-05-10 MED ORDER — OXYCODONE HCL 5 MG PO TABS: 5.0000 mg | ORAL_TABLET | ORAL | 0 refills | Status: DC | PRN

## 2023-05-10 MED ORDER — VITAMIN B-12 1000 MCG PO TABS
1000.0000 ug | ORAL_TABLET | Freq: Every day | ORAL | Status: DC
  Administered 2023-05-11 – 2023-05-12 (×2): 1000 ug via ORAL
  Filled 2023-05-10 (×2): qty 1

## 2023-05-10 MED ORDER — EPHEDRINE 5 MG/ML INJ
INTRAVENOUS | Status: AC
  Filled 2023-05-10: qty 5

## 2023-05-10 MED ORDER — MENTHOL 3 MG MT LOZG: 1.0000 | LOZENGE | OROMUCOSAL | Status: DC | PRN

## 2023-05-10 MED ORDER — LORATADINE 10 MG PO TABS
10.0000 mg | ORAL_TABLET | Freq: Every day | ORAL | Status: DC
  Administered 2023-05-11 – 2023-05-12 (×2): 10 mg via ORAL
  Filled 2023-05-10 (×2): qty 1

## 2023-05-10 MED ORDER — POTASSIUM CHLORIDE 10 MEQ/100ML IV SOLN
10.0000 meq | INTRAVENOUS | Status: AC
  Administered 2023-05-11 (×2): 10 meq via INTRAVENOUS
  Filled 2023-05-10 (×3): qty 100

## 2023-05-10 MED ORDER — CHLORHEXIDINE GLUCONATE 0.12 % MT SOLN
15.0000 mL | Freq: Once | OROMUCOSAL | Status: AC
  Administered 2023-05-10: 15 mL via OROMUCOSAL

## 2023-05-10 MED ORDER — OXYCODONE HCL 5 MG PO TABS
5.0000 mg | ORAL_TABLET | ORAL | Status: DC | PRN
  Administered 2023-05-11: 5 mg via ORAL
  Administered 2023-05-11: 10 mg via ORAL
  Administered 2023-05-12 (×2): 5 mg via ORAL
  Filled 2023-05-10: qty 2
  Filled 2023-05-10 (×4): qty 1

## 2023-05-10 MED ORDER — BUPIVACAINE LIPOSOME 1.3 % IJ SUSP
INTRAMUSCULAR | Status: DC | PRN
  Administered 2023-05-10: 10 mL via PERINEURAL

## 2023-05-10 MED ORDER — AMISULPRIDE (ANTIEMETIC) 5 MG/2ML IV SOLN: 10.0000 mg | Freq: Once | INTRAVENOUS | Status: DC | PRN

## 2023-05-10 MED ORDER — ONDANSETRON HCL 4 MG/2ML IJ SOLN
INTRAMUSCULAR | Status: DC | PRN
  Administered 2023-05-10: 4 mg via INTRAVENOUS

## 2023-05-10 MED ORDER — RISAQUAD PO CAPS
1.0000 | ORAL_CAPSULE | Freq: Every day | ORAL | Status: DC
  Administered 2023-05-11: 1 via ORAL
  Filled 2023-05-10: qty 1

## 2023-05-10 MED ORDER — ACETAMINOPHEN 500 MG PO TABS: 500.0000 mg | ORAL_TABLET | Freq: Four times a day (QID) | ORAL | Status: DC | PRN

## 2023-05-10 MED ORDER — TRANEXAMIC ACID-NACL 1000-0.7 MG/100ML-% IV SOLN
1000.0000 mg | INTRAVENOUS | Status: AC
  Administered 2023-05-10: 1000 mg via INTRAVENOUS
  Filled 2023-05-10: qty 100

## 2023-05-10 MED ORDER — FOLIC ACID 1 MG PO TABS
1.0000 mg | ORAL_TABLET | ORAL | Status: DC
  Administered 2023-05-12: 1 mg via ORAL
  Filled 2023-05-10: qty 1

## 2023-05-10 MED ORDER — ROSUVASTATIN CALCIUM 10 MG PO TABS
5.0000 mg | ORAL_TABLET | ORAL | Status: DC
  Administered 2023-05-11: 5 mg via ORAL
  Filled 2023-05-10: qty 1

## 2023-05-10 MED ORDER — ONDANSETRON HCL 4 MG/2ML IJ SOLN
INTRAMUSCULAR | Status: AC
  Filled 2023-05-10: qty 2

## 2023-05-10 MED ORDER — ENOXAPARIN SODIUM 30 MG/0.3ML IJ SOSY: 30.0000 mg | PREFILLED_SYRINGE | Freq: Two times a day (BID) | INTRAMUSCULAR | Status: DC

## 2023-05-10 MED ORDER — MIDAZOLAM HCL 2 MG/2ML IJ SOLN: 1.0000 mg | Freq: Once | INTRAMUSCULAR | Status: DC

## 2023-05-10 MED ORDER — CARVEDILOL 3.125 MG PO TABS
3.1250 mg | ORAL_TABLET | Freq: Two times a day (BID) | ORAL | Status: DC
  Administered 2023-05-10 – 2023-05-12 (×4): 3.125 mg via ORAL
  Filled 2023-05-10 (×4): qty 1

## 2023-05-10 MED ORDER — 0.9 % SODIUM CHLORIDE (POUR BTL) OPTIME
TOPICAL | Status: DC | PRN
  Administered 2023-05-10: 1000 mL

## 2023-05-10 MED ORDER — BUPIVACAINE HCL 0.25 % IJ SOLN
INTRAMUSCULAR | Status: AC
  Filled 2023-05-10: qty 1

## 2023-05-10 MED ORDER — TRANEXAMIC ACID-NACL 1000-0.7 MG/100ML-% IV SOLN
1000.0000 mg | Freq: Once | INTRAVENOUS | Status: AC
  Administered 2023-05-10: 1000 mg via INTRAVENOUS
  Filled 2023-05-10: qty 100

## 2023-05-10 MED ORDER — PSYLLIUM 95 % PO PACK
1.0000 | PACK | Freq: Every day | ORAL | Status: DC
  Administered 2023-05-11 – 2023-05-12 (×2): 1 via ORAL
  Filled 2023-05-10 (×2): qty 1

## 2023-05-10 MED ORDER — ACETAMINOPHEN 500 MG PO TABS
1000.0000 mg | ORAL_TABLET | Freq: Once | ORAL | Status: AC
  Administered 2023-05-10: 1000 mg via ORAL
  Filled 2023-05-10: qty 2

## 2023-05-10 MED ORDER — AMOXICILLIN 500 MG PO CAPS: 2000.0000 mg | ORAL_CAPSULE | ORAL | Status: DC

## 2023-05-10 MED ORDER — DOCUSATE SODIUM 100 MG PO CAPS: 100.0000 mg | ORAL_CAPSULE | Freq: Every day | ORAL | Status: DC

## 2023-05-10 MED ORDER — ONDANSETRON HCL 4 MG PO TABS: 4.0000 mg | ORAL_TABLET | Freq: Three times a day (TID) | ORAL | 1 refills | Status: DC | PRN

## 2023-05-10 SURGICAL SUPPLY — 72 items
AID PSTN UNV HD RSTRNT DISP (MISCELLANEOUS) ×1
BAG COUNTER SPONGE SURGICOUNT (BAG) IMPLANT
BAG SPEC THK2 15X12 ZIP CLS (MISCELLANEOUS)
BAG SPNG CNTER NS LX DISP (BAG)
BAG ZIPLOCK 12X15 (MISCELLANEOUS) IMPLANT
BIT DRILL 1.6MX128 (BIT) IMPLANT
BIT DRILL 170X2.5X (BIT) IMPLANT
BIT DRL 170X2.5X (BIT) ×1
BLADE SAG 18X100X1.27 (BLADE) ×2 IMPLANT
CEMENT HV SMART SET (Cement) IMPLANT
CLSR STERI-STRIP ANTIMIC 1/2X4 (GAUZE/BANDAGES/DRESSINGS) IMPLANT
COVER BACK TABLE 60X90IN (DRAPES) ×2 IMPLANT
COVER SURGICAL LIGHT HANDLE (MISCELLANEOUS) ×2 IMPLANT
DRAPE INCISE IOBAN 66X45 STRL (DRAPES) ×2 IMPLANT
DRAPE ORTHO SPLIT 77X108 STRL (DRAPES) ×2
DRAPE SHEET LG 3/4 BI-LAMINATE (DRAPES) ×2 IMPLANT
DRAPE SURG ORHT 6 SPLT 77X108 (DRAPES) ×4 IMPLANT
DRAPE TOP 10253 STERILE (DRAPES) ×2 IMPLANT
DRAPE U-SHAPE 47X51 STRL (DRAPES) ×2 IMPLANT
DRILL 2.5 (BIT) ×1
DRSG ADAPTIC 3X8 NADH LF (GAUZE/BANDAGES/DRESSINGS) ×2 IMPLANT
DRSG EMULSION OIL 3X16 NADH (GAUZE/BANDAGES/DRESSINGS) IMPLANT
DURAPREP 26ML APPLICATOR (WOUND CARE) ×2 IMPLANT
ELECT BLADE TIP CTD 4 INCH (ELECTRODE) ×2 IMPLANT
ELECT NDL TIP 2.8 STRL (NEEDLE) ×2 IMPLANT
ELECT NEEDLE TIP 2.8 STRL (NEEDLE) ×1 IMPLANT
ELECT REM PT RETURN 15FT ADLT (MISCELLANEOUS) ×2 IMPLANT
FACESHIELD WRAPAROUND (MASK) ×2 IMPLANT
FACESHIELD WRAPAROUND OR TEAM (MASK) ×2 IMPLANT
GAUZE PAD ABD 8X10 STRL (GAUZE/BANDAGES/DRESSINGS) ×2 IMPLANT
GAUZE SPONGE 4X4 12PLY STRL (GAUZE/BANDAGES/DRESSINGS) ×2 IMPLANT
GLENOSPHERE DELTA XTEND LAT 38 (Miscellaneous) IMPLANT
GLOVE BIOGEL PI IND STRL 7.5 (GLOVE) ×2 IMPLANT
GLOVE BIOGEL PI IND STRL 8.5 (GLOVE) ×2 IMPLANT
GLOVE ORTHO TXT STRL SZ7.5 (GLOVE) ×2 IMPLANT
GLOVE SURG ORTHO 8.5 STRL (GLOVE) ×2 IMPLANT
GOWN STRL REUS W/ TWL XL LVL3 (GOWN DISPOSABLE) ×4 IMPLANT
GOWN STRL REUS W/TWL XL LVL3 (GOWN DISPOSABLE) ×2
KIT BASIN OR (CUSTOM PROCEDURE TRAY) ×2 IMPLANT
KIT TURNOVER KIT A (KITS) IMPLANT
MANIFOLD NEPTUNE II (INSTRUMENTS) ×2 IMPLANT
METAGLENE DELTA EXTEND (Trauma) IMPLANT
METAGLENE DXTEND (Trauma) ×1 IMPLANT
NDL MAYO CATGUT SZ4 TPR NDL (NEEDLE) IMPLANT
NEEDLE MAYO CATGUT SZ4 (NEEDLE) ×1 IMPLANT
NS IRRIG 1000ML POUR BTL (IV SOLUTION) ×2 IMPLANT
PACK SHOULDER (CUSTOM PROCEDURE TRAY) ×2 IMPLANT
PIN GUIDE 1.2 (PIN) IMPLANT
PIN GUIDE GLENOPHERE 1.5MX300M (PIN) IMPLANT
PIN METAGLENE 2.5 (PIN) IMPLANT
RESTRAINT HEAD UNIVERSAL NS (MISCELLANEOUS) ×2 IMPLANT
SCREW 4.5X18MM (Screw) ×1 IMPLANT
SCREW 4.5X36MM (Screw) IMPLANT
SCREW 48L (Screw) IMPLANT
SCREW BN 18X4.5XSTRL SHLDR (Screw) IMPLANT
SLING ARM FOAM STRAP LRG (SOFTGOODS) IMPLANT
SPACER 38 PLUS 3 (Spacer) IMPLANT
SPIKE FLUID TRANSFER (MISCELLANEOUS) ×2 IMPLANT
SPONGE T-LAP 4X18 ~~LOC~~+RFID (SPONGE) IMPLANT
STAPLER VISISTAT 35W (STAPLE) IMPLANT
STEM HUM PC SZ1 RT (Stem) IMPLANT
STEM STANDARD SZ 10 113MM (Stem) ×1 IMPLANT
STEM STD SZ 10 113MM (Stem) IMPLANT
STRIP CLOSURE SKIN 1/2X4 (GAUZE/BANDAGES/DRESSINGS) ×2 IMPLANT
SUT FIBERWIRE #2 38 T-5 BLUE (SUTURE) ×5
SUT MNCRL AB 4-0 PS2 18 (SUTURE) ×2 IMPLANT
SUT VIC AB 0 CT1 36 (SUTURE) ×2 IMPLANT
SUT VIC AB 0 CT2 27 (SUTURE) ×2 IMPLANT
SUT VIC AB 2-0 CT1 27 (SUTURE) ×2
SUT VIC AB 2-0 CT1 TAPERPNT 27 (SUTURE) ×2 IMPLANT
SUTURE FIBERWR #2 38 T-5 BLUE (SUTURE) ×2 IMPLANT
TOWEL OR 17X26 10 PK STRL BLUE (TOWEL DISPOSABLE) ×2 IMPLANT

## 2023-05-10 NOTE — Transfer of Care (Signed)
Immediate Anesthesia Transfer of Care Note  Patient: Ann Long  Procedure(s) Performed: REVERSE SHOULDER ARTHROPLASTY (Right: Shoulder)  Patient Location: PACU  Anesthesia Type:Regional and GA combined with regional for post-op pain  Level of Consciousness: awake, alert , oriented, and patient cooperative  Airway & Oxygen Therapy: Patient Spontanous Breathing and Patient connected to face mask oxygen  Post-op Assessment: Report given to RN and Post -op Vital signs reviewed and stable  Post vital signs: Reviewed and stable  Last Vitals:  Vitals Value Taken Time  BP 99/64 05/10/23 1822  Temp    Pulse 69 05/10/23 1826  Resp 14 05/10/23 1826  SpO2 100 % 05/10/23 1826  Vitals shown include unvalidated device data.  Last Pain:  Vitals:   05/10/23 1540  TempSrc:   PainSc: 0-No pain      Patients Stated Pain Goal: 2 (05/10/23 1428)  Complications: No notable events documented.

## 2023-05-10 NOTE — Discharge Instructions (Signed)
Ice to the shoulder constantly.  Keep the incision covered and clean and dry for one week, then ok to get it wet in the shower. The the incision open to air after one week.   Do exercise as instructed several times per day. Be gentle and keep your arm supported and close to you.   DO NOT reach behind your back or push up out of a chair with the operative arm.  Use a sling while you are up and around for comfort, may remove while seated.  Keep pillow propped behind the operative elbow.  Follow up with Dr Ranell Patrick in two weeks in the office, call [phone] for appt  Please call Dr Ranell Patrick (cell) [phone] with any questions or concerns.

## 2023-05-10 NOTE — Interval H&P Note (Signed)
History and Physical Interval Note:  05/10/2023 3:01 PM  Ann Long  has presented today for surgery, with the diagnosis of Right proximal humerus fracture.  The various methods of treatment have been discussed with the patient and family. After consideration of risks, benefits and other options for treatment, the patient has consented to  Procedure(s) with comments: REVERSE SHOULDER ARTHROPLASTY (Right) - interscalene block 110 as a surgical intervention.  The patient's history has been reviewed, patient examined, no change in status, stable for surgery.  I have reviewed the patient's chart and labs.  Questions were answered to the patient's satisfaction.     Verlee Rossetti

## 2023-05-10 NOTE — Plan of Care (Signed)
Discuss and review plan of care with patient/family  

## 2023-05-10 NOTE — Op Note (Unsigned)
NAME: Ann Long, Ann Long MEDICAL RECORD NO: [id number] ACCOUNT NO: [id number] DATE OF BIRTH: October 10, 1951 FACILITY: [facility] LOCATION: [facility] PHYSICIAN: Almedia Balls. Ranell Patrick, MD  Operative Report   DATE OF PROCEDURE: 05/10/2023  PREOPERATIVE DIAGNOSIS:  Right proximal humerus fracture, comminuted and displaced.  POSTOPERATIVE DIAGNOSIS:  Right proximal humerus fracture, comminuted and displaced.  PROCEDURE PERFORMED:  Right reverse total shoulder arthroplasty utilizing DePuy Delta Xtend prosthesis with Fracture Epi and tuberosity repair.  ATTENDING SURGEON:  Almedia Balls. Ranell Patrick, MD.  ASSISTANT:  Konrad Felix Dixon, [place], who was scrubbed during the entire procedure, and necessary for satisfactory completion of surgery.  ANESTHESIA:  General anesthesia was used plus interscalene block.  ESTIMATED BLOOD LOSS:  450 mL.  FLUID REPLACEMENT:  1500 mL crystalloid.  COUNTS:  Instrument counts correct.  COMPLICATIONS:  No complications.  ANTIBIOTICS:  Perioperative antibiotics were given.  INDICATIONS:  The patient is a 72 year old female who presents with a history of worsening right shoulder pain due to a comminuted and displaced proximal humerus fracture.  The humeral shaft displaced medially.  The patient was having progressively  worsening pain as well as some neuro symptoms concerning for nerve impingement.  Based on the displacement of the fracture and then the unlikeliness of healing and the neuro deficit, we recommended surgical management with reverse shoulder replacement  for the 72 year old with some preexisting arthritis.  Informed consent obtained.  DESCRIPTION OF PROCEDURE:  After an adequate level of anesthesia was achieved, the patient was positioned in the modified beach chair position.  Right shoulder correctly identified and sterilely prepped and draped in the usual manner.  Timeout called,  verifying correct patient, correct site and we entered the patient's shoulder using a  standard deltopectoral approach, starting at the coracoid process extending down to the anterior humerus using a 10 blade scalpel.  Dissection carried out through the  subcutaneous tissues with Bovie.  We identified cephalic vein and took that laterally with the deltoid, pectoralis taken medially.  Conjoined tendon identified and retracted medially.  The fracture hematoma was encountered and evacuated.  The humeral  shaft was displaced severely medially and there was a large spike off the medial humerus of the metaphysis that appeared to have done some muscular damage and also created a large hematoma near the brachial plexus.  This was delivered gently out of the  medial soft tissues and mobilized away from the neurovascular structures.  We did identify the biceps tendon and tenodesed in situ with 0 Vicryl figure-of-eight suture.  We then went ahead and used an osteotome to remove a small wafer of lesser  tuberosity with the subscapularis attached.  We placed #2 FiberWire suture x3 just medial to the lesser tuberosity and in a mattress fashion getting good tendon purchase for repair at the end.  We also did the same to the greater tuberosity and used the  osteotome to create a wafer thin bit of greater tuberosity for repair.  We took the humeral head out.  It was arthritic and also had a head split component.  Once we had all the humeral head out and the greater tuberosity tagged with #2 FiberWire suture  lateral to the greater tuberosity in it with good tendon purchase.  Then, we subluxed the humerus inferiorly, posteriorly and getting good exposure of the glenoid face.  There was a little bit of cartilage remaining, which were removed.  We also removed  the labrum.  We were careful to protect the axillary nerve, which was palpable.  We then  went ahead and placed our guide pin, centered low for the metaglene baseplate preparation.  We then reamed for the metaglene baseplate down to subchondral bone.  I   did peripheral hand reamer with the T-handle reamer and then we drilled our central peg hole for the metaglene baseplate.  Once we impacted our baseplate in position, we placed a 48 screw inferiorly with so-so purchase, but it was probably between the  tables of the scapula.  We then went to the superior screw and got a 36 very good screw base of the coracoid and then an 18 screw nonlocked in the back with good purchase.  We locked the superior and inferior screws.  We felt like the baseplate stability  was good.  We selected the 38+0 standard glenosphere and attached that to the baseplate with a screwdriver.  I did a finger sweep to make sure we had no soft tissue caught up in that baseplate glenosphere combination.  We then went ahead and went to the  humeral side.  We reamed with a 6 mm reamer, reaming up to a size 10.  We then trialled with the 10 stem with the right Fracture Epi, set on the 0 setting and placed in 20 degrees of retroversion.  The stem fit very well at the appropriate height.  We  reduced with a 38+3 poly, it was appropriately ____ and we felt like we had good stability, so we initially thought we might have to cement, but based on the stability of the stem we felt like with additional bone grafting we will be able to do a  press-fit technique.  So we selected the 10 Porocoat stem with the right Fracture Epi set on the 0 setting and we drilled holes in the shaft of the humerus, 2 holes to place #2 FiberWire suture for tuberosity to shaft repair.  We placed a suture through  the implant for around-the-world stitch.  There was a hole that goes through the stem of the implant and we just took a #2 FiberWire suture and placed that through that stem.  Once we had the humerus prepped and the sutures placed, we irrigated  thoroughly and then we went ahead and used available bone graft from the humeral head and did impaction grafting technique with the Porocoat stem that was driven down in 20  degrees of retroversion.  We had a really good stem security.  We had good fit  distally and also good metaphyseal fit with good bone.  So at this point, we selected the real 38+3 poly, placed on the humeral tray, impacted that and reduced the shoulder again appropriate length.  No gapping with inferior pole or external rotation and  appropriate conjoined tensioning.  Next, we meticulously repaired the tuberosities to each other and to the shaft using the shaft sutures and also around-the-world stitch lateral to the greater tuberosity and medial to the lesser tuberosity.  For  additional compression, we also bone grafted extensively around the Porocoat metaphyseal area to facilitate tuberosity healing.  Once we had all the tuberosities repaired, ranged the shoulder, there was no significant restriction of motion based on her  tuberosity repair.  We irrigated thoroughly and then closed deltopectoral interval with 0 Vicryl suture followed by 2-0 Vicryl for subcutaneous closure and staples for skin.  Sterile dressing applied followed by a shoulder sling.  Patient transported to  recovery room in stable condition.   CHR D: 05/10/2023 6:21:07 pm T: 05/10/2023 10:01:00 pm  JOB:  [id number]/ [id number]  

## 2023-05-10 NOTE — Anesthesia Procedure Notes (Signed)
Anesthesia Regional Block: Interscalene brachial plexus block   Pre-Anesthetic Checklist: , timeout performed,  Correct Patient, Correct Site, Correct Laterality,  Correct Procedure, Correct Position, site marked,  Risks and benefits discussed,  Surgical consent,  Pre-op evaluation,  At surgeon's request and post-op pain management  Laterality: Upper and Right  Prep: chloraprep       Needles:  Injection technique: Single-shot  Needle Type: Stimulator Needle - 40     Needle Length: 4cm  Needle Gauge: 22     Additional Needles:   Procedures:,,,, ultrasound used (permanent image in chart),,    Narrative:  Start time: 05/10/2023 3:13 PM End time: 05/10/2023 3:33 PM Injection made incrementally with aspirations every 5 mL.  Performed by: Personally  Anesthesiologist: [name], MD  Additional Notes: BP cuff, SpO2 and EKG monitors applied. Sedation begun. Nerve location verified with ultrasound. Anesthetic injected incrementally, slowly, and after neg aspirations under direct u/s guidance. Good perineural spread. Tolerated well.

## 2023-05-10 NOTE — Anesthesia Postprocedure Evaluation (Signed)
Anesthesia Post Note  Patient: Ann Long  Procedure(s) Performed: REVERSE SHOULDER ARTHROPLASTY (Right: Shoulder)     Patient location during evaluation: PACU Anesthesia Type: General Level of consciousness: sedated and patient cooperative Pain management: pain level controlled Vital Signs Assessment: post-procedure vital signs reviewed and stable Respiratory status: spontaneous breathing Cardiovascular status: stable Anesthetic complications: no   No notable events documented.  Last Vitals:  Vitals:   05/10/23 2115 05/10/23 2130  BP: 116/64 115/60  Pulse: 85 74  Resp: 19 16  Temp:    SpO2: 100% 100%    Last Pain:  Vitals:   05/10/23 2115  TempSrc:   PainSc: 0-No pain                 [name]

## 2023-05-10 NOTE — Brief Op Note (Signed)
05/10/2023  6:12 PM  PATIENT:  Ann Long  72 y.o. female  PRE-OPERATIVE DIAGNOSIS:  Right proximal humerus fracture, comminuted and displaced  POST-OPERATIVE DIAGNOSIS:  Right proximal humerus fracture, comminuted and displaced  PROCEDURE:  Procedure(s) with comments: REVERSE SHOULDER ARTHROPLASTY (Right) - interscalene block 110  DePuy Delta Xtend with fracture EPI and tuberosity repair  SURGEON:  Surgeon(s) and Role:    Beverely Low, MD - Primary  PHYSICIAN ASSISTANT:   ASSISTANTS: Thea Gist, PA-C   ANESTHESIA:   regional and general  EBL:  450 mL   BLOOD ADMINISTERED:none  DRAINS: none   LOCAL MEDICATIONS USED:  MARCAINE     SPECIMEN:  No Specimen  DISPOSITION OF SPECIMEN:  N/A  COUNTS:  YES  TOURNIQUET:  * No tourniquets in log *  DICTATION: .Other Dictation: Dictation Number [id number]  PLAN OF CARE: Admit to inpatient   PATIENT DISPOSITION:  PACU - hemodynamically stable.   Delay start of Pharmacological VTE agent (>24hrs) due to surgical blood loss or risk of bleeding: no

## 2023-05-10 NOTE — Anesthesia Procedure Notes (Addendum)
Procedure Name: Intubation Date/Time: 05/10/2023 3:55 PM  Performed by: Lovie Chol, CRNAPre-anesthesia Checklist: Patient identified, Emergency Drugs available, Suction available and Patient being monitored Patient Re-evaluated:Patient Re-evaluated prior to induction Oxygen Delivery Method: Circle System Utilized Preoxygenation: Pre-oxygenation with 100% oxygen Induction Type: IV induction Ventilation: Mask ventilation without difficulty Laryngoscope Size: Miller and 3 Grade View: Grade I Tube type: Oral Tube size: 7.0 mm Number of attempts: 1 Airway Equipment and Method: Stylet and Oral airway Placement Confirmation: ETT inserted through vocal cords under direct vision, positive ETCO2 and breath sounds checked- equal and bilateral Secured at: 21 cm Tube secured with: Tape Dental Injury: Teeth and Oropharynx as per pre-operative assessment

## 2023-05-11 ENCOUNTER — Encounter (HOSPITAL_COMMUNITY): Payer: Self-pay | Admitting: Orthopedic Surgery

## 2023-05-11 ENCOUNTER — Other Ambulatory Visit: Payer: Self-pay

## 2023-05-11 LAB — TYPE AND SCREEN
Antibody Screen: NEGATIVE
Unit division: 0

## 2023-05-11 LAB — CBC
HCT: 34.2 % — ABNORMAL LOW (ref 36.0–46.0)
Hemoglobin: 11.1 g/dL — ABNORMAL LOW (ref 12.0–15.0)
MCH: 30.2 pg (ref 26.0–34.0)
MCHC: 32.5 g/dL (ref 30.0–36.0)
MCV: 93.2 fL (ref 80.0–100.0)
Platelets: 241 10*3/uL (ref 150–400)
RBC: 3.67 MIL/uL — ABNORMAL LOW (ref 3.87–5.11)
RDW: 14.5 % (ref 11.5–15.5)
WBC: 8.5 10*3/uL (ref 4.0–10.5)
nRBC: 0 % (ref 0.0–0.2)

## 2023-05-11 LAB — PREPARE RBC (CROSSMATCH)

## 2023-05-11 LAB — BASIC METABOLIC PANEL
Anion gap: 8 (ref 5–15)
BUN: 18 mg/dL (ref 8–23)
CO2: 23 mmol/L (ref 22–32)
Calcium: 8.1 mg/dL — ABNORMAL LOW (ref 8.9–10.3)
Chloride: 103 mmol/L (ref 98–111)
Creatinine, Ser: 0.85 mg/dL (ref 0.44–1.00)
GFR, Estimated: >60 mL/min (ref >60)
Glucose, Bld: 132 mg/dL — ABNORMAL HIGH (ref 70–99)
Potassium: 3.3 mmol/L — ABNORMAL LOW (ref 3.5–5.1)
Sodium: 134 mmol/L — ABNORMAL LOW (ref 135–145)

## 2023-05-11 LAB — BPAM RBC
Blood Product Expiration Date: 202406182359
Unit Type and Rh: 7300

## 2023-05-11 NOTE — Evaluation (Signed)
Physical Therapy Evaluation Patient Details Name: VANDA WORSHAM MRN: [id number] DOB: [birth year] Today's Date: 05/11/2023  History of Present Illness  72 yo female admitted with R shoulder fx after fall. 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 Hx of R THA, osteoporosis, DVT, L THA  Clinical Impression  On eval, pt was Mod Ind with mobility. She walked ~185 around the unit. She denies pain currently. She tolerated activity well. Will plan to practice stair negotiation on tomorrow.        Recommendations for follow up therapy are one component of a multi-disciplinary discharge planning process, led by the attending physician.  Recommendations may be updated based on patient status, additional functional criteria and insurance authorization.  Follow Up Recommendations       Assistance Recommended at Discharge None  Patient can return home with the following  Assist for transportation;Assistance with cooking/housework;Help with stairs or ramp for entrance    Equipment Recommendations None recommended by PT  Recommendations for Other Services       Functional Status Assessment Patient has had a recent decline in their functional status and demonstrates the ability to make significant improvements in function in a reasonable and predictable amount of time.     Precautions / Restrictions Precautions Precautions: Shoulder Type of Shoulder Precautions: NWB, conservative protocol per suregeon Shoulder Interventions: Shoulder sling/immobilizer;Off for dressing/bathing/exercises Precaution Booklet Issued: Yes (comment) Required Braces or Orthoses: Sling Restrictions Weight Bearing Restrictions: Yes RUE Weight Bearing: Non weight bearing      Mobility  Bed Mobility               General bed mobility comments: oob in recliner    Transfers Overall transfer level: Modified independent                      Ambulation/Gait Ambulation/Gait assistance: Modified  independent (Device/Increase time) Gait Distance (Feet): 185 Feet Assistive device: None Gait Pattern/deviations: Step-through pattern, Decreased stride length       General Gait Details: Increased lateral sway-pt reports this is her baseline. Denied dizziness. Tolerated distance well.  Stairs            Wheelchair Mobility    Modified Rankin (Stroke Patients Only)       Balance Overall balance assessment: Needs assistance         Standing balance support: No upper extremity supported Standing balance-Leahy Scale: Good                               Pertinent Vitals/Pain Pain Assessment Pain Assessment: No/denies pain    Home Living Family/patient expects to be discharged to:: Private residence Living Arrangements: Alone Available Help at Discharge: Family Type of Home: House Home Access: Stairs to enter Entrance Stairs-Rails: Left Entrance Stairs-Number of Steps: 5   Home Layout: One level Home Equipment: Agricultural consultant (2 wheels);BSC/3in1;Cane - single point      Prior Function Prior Level of Function : Independent/Modified Independent             Mobility Comments: no AD for mobility ADLs Comments: Ind with ADLs, IADLs, home mgt, cooking and driving     Hand Dominance   Dominant Hand: Right    Extremity/Trunk Assessment   Upper Extremity Assessment Upper Extremity Assessment: Defer to OT evaluation RUE: Unable to fully assess due to immobilization    Lower Extremity Assessment Lower Extremity Assessment: Overall WFL for tasks assessed    Cervical /  Trunk Assessment Cervical / Trunk Assessment: Normal  Communication   Communication: No difficulties  Cognition Arousal/Alertness: Awake/alert Behavior During Therapy: WFL for tasks assessed/performed Overall Cognitive Status: Within Functional Limits for tasks assessed                                          General Comments      Exercises      Assessment/Plan    PT Assessment Patient needs continued PT services  PT Problem List Decreased mobility       PT Treatment Interventions Stair training;Gait training;Functional mobility training;Patient/family education    PT Goals (Current goals can be found in the Care Plan section)  Acute Rehab PT Goals Patient Stated Goal: home tomorrow PT Goal Formulation: With patient/family Time For Goal Achievement: 05/25/23 Potential to Achieve Goals: Good    Frequency Min 3X/week     Co-evaluation               AM-PAC PT "6 Clicks" Mobility  Outcome Measure Help needed turning from your back to your side while in a flat bed without using bedrails?: None Help needed moving from lying on your back to sitting on the side of a flat bed without using bedrails?: None Help needed moving to and from a bed to a chair (including a wheelchair)?: None Help needed standing up from a chair using your arms (e.g., wheelchair or bedside chair)?: None Help needed to walk in hospital room?: A Little Help needed climbing 3-5 steps with a railing? : A Little 6 Click Score: 22    End of Session Equipment Utilized During Treatment: Gait belt Activity Tolerance: Patient tolerated treatment well Patient left: in chair;with call bell/phone within reach;with family/visitor present        Time: 1140-1150 PT Time Calculation (min) (ACUTE ONLY): 10 min   Charges:   PT Evaluation $PT Eval Low Complexity: 1 Low             Faye Ramsay, PT Acute Rehabilitation  Office: [phone]

## 2023-05-11 NOTE — Evaluation (Signed)
Occupational Therapy Evaluation Patient Details Name: Ann Long MRN: [id number] DOB: Aug 19, 1951 Today's Date: 05/11/2023   History of Present Illness 72 yo female admitted with R shoulder fx after fall. 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 Hx of R THA, osteoporosis, DVT, L THA   Clinical Impression   Pt presents with decline in function and safety with ADLs. PTA pt lived at home alone and was Ind with ADs, IADLs, home mgt, was driving. Pt currently requires min A with ADLs, mod A to don sling, Mod I to transfer to toilet and sup to transfer in and out of walk in shower using grab bar for safety. Pt's daughter and other family will provide assist at home. Pt and her daughter instructed on compensatory ADL techniques, sling wear and ROM of hand, wrist and elbow as allowed per MD. Pt and her daughter verbalize and demo understanding. Pt would benefit from acute OT services to address impairments      Recommendations for follow up therapy are one component of a multi-disciplinary discharge planning process, led by the attending physician.  Recommendations may be updated based on patient status, additional functional criteria and insurance authorization.   Assistance Recommended at Discharge Intermittent Supervision/Assistance  Patient can return home with the following A little help with bathing/dressing/bathroom;Assistance with cooking/housework;Assist for transportation;Help with stairs or ramp for entrance    Functional Status Assessment  Patient has had a recent decline in their functional status and demonstrates the ability to make significant improvements in function in a reasonable and predictable amount of time.  Equipment Recommendations  None recommended by OT    Recommendations for Other Services       Precautions / Restrictions Precautions Precautions: Shoulder Type of Shoulder Precautions: NWB, conservative protocol per suregeon Shoulder Interventions: Shoulder  sling/immobilizer;Off for dressing/bathing/exercises Precaution Booklet Issued: Yes (comment) Required Braces or Orthoses: Sling Restrictions Weight Bearing Restrictions: Yes RUE Weight Bearing: Non weight bearing      Mobility Bed Mobility Overal bed mobility: Needs Assistance Bed Mobility: Supine to Sit     Supine to sit: Supervision, HOB elevated     General bed mobility comments: used rail    Transfers Overall transfer level: Modified independent   Transfers: Sit to/from Stand, Bed to chair/wheelchair/BSC Sit to Stand: Modified independent (Device/Increase time)           General transfer comment: pt walked to bathroom with no physical assist required, no LOB; pt with slow guarded pace of movement      Balance Overall balance assessment: Mild deficits observed, not formally tested                                         ADL either performed or assessed with clinical judgement   ADL Overall ADL's : Needs assistance/impaired Eating/Feeding: Set up;Independent   Grooming: Wash/dry hands;Wash/dry face;Supervision/safety;Standing;With caregiver independent assisting   Upper Body Bathing: Minimal assistance;With caregiver independent assisting   Lower Body Bathing: Minimal assistance;With caregiver independent assisting;Sitting/lateral leans;Sit to/from stand Lower Body Bathing Details (indicate cue type and reason): simulated Upper Body Dressing : Minimal assistance;With caregiver independent assisting     Lower Body Dressing Details (indicate cue type and reason): will nedd assist Toilet Transfer: Modified Independent   Toileting- Clothing Manipulation and Hygiene: Supervision/safety;Sit to/from stand Toileting - Clothing Manipulation Details (indicate cue type and reason): clothing mgt and anterior hygiene Tub/ Shower Transfer: Supervision/safety;Ambulation;Grab bars  Functional mobility during ADLs: Supervision/safety;Modified  independent General ADL Comments: Sup with stepping in and out of shower. Pt and her daughter educated on shoulder protocol, sling wear, compensatory ADL techniques with handouts provided     Vision Baseline Vision/History: 1 Wears glasses Ability to See in Adequate Light: 0 Adequate Patient Visual Report: No change from baseline       Perception     Praxis      Pertinent Vitals/Pain Pain Assessment Pain Assessment: No/denies pain     Hand Dominance Right   Extremity/Trunk Assessment Upper Extremity Assessment Upper Extremity Assessment: Defer to OT evaluation RUE: Unable to fully assess due to immobilization   Lower Extremity Assessment Lower Extremity Assessment: Overall WFL for tasks assessed   Cervical / Trunk Assessment Cervical / Trunk Assessment: Normal   Communication Communication Communication: No difficulties   Cognition Arousal/Alertness: Awake/alert Behavior During Therapy: WFL for tasks assessed/performed Overall Cognitive Status: Within Functional Limits for tasks assessed                                       General Comments       Exercises     Shoulder Instructions Shoulder Instructions Donning/doffing shirt without moving shoulder: Minimal assistance Method for sponge bathing under operated UE: Independent Donning/doffing sling/immobilizer: Moderate assistance Correct positioning of sling/immobilizer: Independent ROM for elbow, wrist and digits of operated UE: Supervision/safety Sling wearing schedule (on at all times/off for ADL's): Independent Proper positioning of operated UE when showering: Independent Positioning of UE while sleeping: Independent    Home Living Family/patient expects to be discharged to:: Private residence Living Arrangements: Alone Available Help at Discharge: Family Type of Home: House Home Access: Stairs to enter Secretary/administrator of Steps: 5 Entrance Stairs-Rails: Left Home Layout: One  level     Bathroom Shower/Tub: Tub/shower unit;Walk-in shower   Bathroom Toilet: Standard     Home Equipment: Agricultural consultant (2 wheels);BSC/3in1;Cane - single point          Prior Functioning/Environment Prior Level of Function : Independent/Modified Independent             Mobility Comments: no AD for mobility ADLs Comments: Ind with ADLs, IADLs, home mgt, cooking and driving        OT Problem List: Decreased strength;Decreased knowledge of use of DME or AE;Impaired UE functional use;Increased edema;Decreased range of motion;Decreased coordination      OT Treatment/Interventions: Self-care/ADL training;Therapeutic activities;Patient/family education    OT Goals(Current goals can be found in the care plan section) Acute Rehab OT Goals Patient Stated Goal: go home OT Goal Formulation: With patient/family Time For Goal Achievement: 05/25/23 Potential to Achieve Goals: Good ADL Goals Additional ADL Goal #1: Pt/caregiver will be Mod I with UB ADL assist techniques Additional ADL Goal #2: Pt/caregiver will be Mod I with LB ADL techniques Additional ADL Goal #3: Pt will be Ind with R hand, wrist and elbow ROM as instructed Additional ADL Goal #4: Pt safely transfer to toilet for toileting tasks Mod I  OT Frequency: Min 2X/week    Co-evaluation              AM-PAC OT "6 Clicks" Daily Activity     Outcome Measure Help from another person eating meals?: None Help from another person taking care of personal grooming?: A Little Help from another person toileting, which includes using toliet, bedpan, or urinal?: A Little Help from another  person bathing (including washing, rinsing, drying)?: A Little Help from another person to put on and taking off regular upper body clothing?: A Little Help from another person to put on and taking off regular lower body clothing?: A Little 6 Click Score: 19   End of Session Equipment Utilized During Treatment: Gait belt Nurse  Communication: Mobility status  Activity Tolerance: Patient tolerated treatment well Patient left: in chair;with call bell/phone within reach;with family/visitor present  OT Visit Diagnosis: History of falling (Z91.81);Pain;Muscle weakness (generalized) (M62.81)                Time: 8119-1478 OT Time Calculation (min): 46 min Charges:  OT General Charges $OT Visit: 1 Visit OT Evaluation $OT Eval Low Complexity: 1 Low OT Treatments $Self Care/Home Management : 23-37 mins $Therapeutic Activity: 8-22 mins   Galen Manila 05/11/2023, 1:10 PM

## 2023-05-11 NOTE — TOC CM/SW Note (Signed)
Transition of Care [facility]) Screening Note  Patient Details  Name: Ann Long Date of Birth: 1951-12-11  Transition of Care [facility]) CM/SW Contact:    Ewing Schlein, LCSW Phone Number: 05/11/2023, 1:21 PM  Transition of Care Department [facility]) has reviewed patient and no TOC needs have been identified at this time. We will continue to monitor patient advancement through interdisciplinary progression rounds. If new patient transition needs arise, please place a TOC consult.

## 2023-05-11 NOTE — Progress Notes (Signed)
Orthopedics Progress Note  Subjective: Patient comfortable this AM. No pain in the shoulder, "My arm is still numb"  She does have some finger tingling like pre-op  Objective:  Vitals:   05/11/23 0141 05/11/23 0348  BP: (!) 100/59 (!) 101/59  Pulse: 82 73  Resp: 16 14  Temp: 98.2 F (36.8 C) 97.9 F (36.6 C)  SpO2: 97% 99%    General: Awake and alert  Musculoskeletal: right shoulder dressing changed to Aquacel Neurovascularly intact  Lab Results  Component Value Date   WBC 8.5 05/11/2023   HGB 11.1 (L) 05/11/2023   HCT 34.2 (L) 05/11/2023   MCV 93.2 05/11/2023   PLT 241 05/11/2023       Component Value Date/Time   NA 134 (L) 05/11/2023 0618   NA 139 01/25/2023 0958   K 3.3 (L) 05/11/2023 0618   CL 103 05/11/2023 0618   CO2 23 05/11/2023 0618   GLUCOSE 132 (H) 05/11/2023 0618   BUN 18 05/11/2023 0618   BUN 17 01/25/2023 0958   CREATININE 0.85 05/11/2023 0618   CALCIUM 8.1 (L) 05/11/2023 0618   GFRNONAA >60 05/11/2023 0618   GFRAA >60 10/04/2019 2334    Lab Results  Component Value Date   INR 2.3 (H) 04/25/2021   INR 2.1 (H) 03/24/2021   INR 1.8 (H) 10/04/2019    Assessment/Plan: POD #1 s/p Procedure(s): REVERSE SHOULDER ARTHROPLASTY Acute blood loss anemia  treated with transfusion of 2 units PRBCs. Hgb 11 today Hypokaelmia - treated with two runs of 10 MEq K+ Increase po intake - heplock IVF once taking po well Accurate I+Os Mobilization and therapy - NWB R UE - conservative protocol Likely discharge tomorrow Will restart Xarelto this evening!   Almedia Balls. Ranell Patrick, MD 05/11/2023 7:57 AM

## 2023-05-12 LAB — TYPE AND SCREEN

## 2023-05-12 LAB — HEMOGLOBIN AND HEMATOCRIT, BLOOD
HCT: 34.5 % — ABNORMAL LOW (ref 36.0–46.0)
Hemoglobin: 11.3 g/dL — ABNORMAL LOW (ref 12.0–15.0)

## 2023-05-12 LAB — BPAM RBC
ISSUE DATE / TIME: 202405202146
Unit Type and Rh: 1700

## 2023-05-12 NOTE — Progress Notes (Signed)
Physical Therapy Treatment Patient Details Name: Ann Long MRN: [id number] DOB: 12-07-1951 Today's Date: 05/12/2023   History of Present Illness 72 yo female admitted with R shoulder fx after fall. 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 Hx of R THA, osteoporosis, DVT, L THA    PT Comments    Pt continues to mobilize well. Practiced stairs this session. All PT education completed.      Recommendations for follow up therapy are one component of a multi-disciplinary discharge planning process, led by the attending physician.  Recommendations may be updated based on patient status, additional functional criteria and insurance authorization.  Follow Up Recommendations       Assistance Recommended at Discharge None  Patient can return home with the following Assist for transportation;Assistance with cooking/housework;A little help with bathing/dressing/bathroom   Equipment Recommendations  None recommended by PT    Recommendations for Other Services       Precautions / Restrictions Precautions Precautions: Shoulder Type of Shoulder Precautions: NWB, conservative protocol per suregeon Shoulder Interventions: Shoulder sling/immobilizer;Off for dressing/bathing/exercises Required Braces or Orthoses: Sling Restrictions Weight Bearing Restrictions: Yes RUE Weight Bearing: NWB    Mobility  Bed Mobility               General bed mobility comments: oob in recliner    Transfers Overall transfer level: Modified independent                      Ambulation/Gait Ambulation/Gait assistance: Modified independent (Device/Increase time) Gait Distance (Feet): 185 Feet Assistive device: None Gait Pattern/deviations: Step-through pattern, Decreased stride length           Stairs Stairs: Yes Stairs assistance: Modified independent (Device/Increase time) Stair Management: Step to pattern, One rail Left Number of Stairs: 5 General stair comments: up and  over stairs x 2. cues for safety, technique, sequence.   Wheelchair Mobility    Modified Rankin (Stroke Patients Only)       Balance Overall balance assessment: Needs assistance         Standing balance support: During functional activity Standing balance-Leahy Scale: Good                              Cognition Arousal/Alertness: Awake/alert Behavior During Therapy: WFL for tasks assessed/performed Overall Cognitive Status: Within Functional Limits for tasks assessed                                          Exercises      General Comments        Pertinent Vitals/Pain Pain Assessment Pain Assessment: Faces Faces Pain Scale: Hurts a little bit Pain Location: R shoulder Pain Descriptors / Indicators: Discomfort Pain Intervention(s): Monitored during session    Home Living                          Prior Function            PT Goals (current goals can now be found in the care plan section) Progress towards PT goals: Progressing toward goals    Frequency    Min 3X/week      PT Plan Current plan remains appropriate    Co-evaluation              AM-PAC PT "6 Clicks" Mobility  Outcome Measure  Help needed turning from your back to your side while in a flat bed without using bedrails?: None Help needed moving from lying on your back to sitting on the side of a flat bed without using bedrails?: None Help needed moving to and from a bed to a chair (including a wheelchair)?: None Help needed standing up from a chair using your arms (e.g., wheelchair or bedside chair)?: None Help needed to walk in hospital room?: None Help needed climbing 3-5 steps with a railing? : None 6 Click Score: 24    End of Session Equipment Utilized During Treatment: Gait belt Activity Tolerance: Patient tolerated treatment well Patient left: in chair;with call bell/phone within reach;with family/visitor present         Time:  1000-1008 PT Time Calculation (min) (ACUTE ONLY): 8 min  Charges:  $Gait Training: 8-22 mins                         Faye Ramsay, PT Acute Rehabilitation  Office: [phone]

## 2023-05-12 NOTE — Plan of Care (Signed)
  Problem: Education: Goal: Knowledge of the prescribed therapeutic regimen will improve Outcome: Adequate for Discharge Goal: Understanding of activity limitations/precautions following surgery will improve Outcome: Adequate for Discharge Goal: Individualized Educational Video(s) Outcome: Adequate for Discharge   Problem: Activity: Goal: Ability to tolerate increased activity will improve Outcome: Adequate for Discharge   Problem: Pain Management: Goal: Pain level will decrease with appropriate interventions Outcome: Adequate for Discharge   Problem: Education: Goal: Knowledge of General Education information will improve Description: Including pain rating scale, medication(s)/side effects and non-pharmacologic comfort measures Outcome: Adequate for Discharge   Problem: Health Behavior/Discharge Planning: Goal: Ability to manage health-related needs will improve Outcome: Adequate for Discharge   Problem: Clinical Measurements: Goal: Ability to maintain clinical measurements within normal limits will improve Outcome: Adequate for Discharge Goal: Will remain free from infection Outcome: Adequate for Discharge Goal: Diagnostic test results will improve Outcome: Adequate for Discharge Goal: Respiratory complications will improve Outcome: Adequate for Discharge Goal: Cardiovascular complication will be avoided Outcome: Adequate for Discharge   Problem: Activity: Goal: Risk for activity intolerance will decrease Outcome: Adequate for Discharge   Problem: Nutrition: Goal: Adequate nutrition will be maintained Outcome: Adequate for Discharge   Problem: Coping: Goal: Level of anxiety will decrease Outcome: Adequate for Discharge   Problem: Elimination: Goal: Will not experience complications related to bowel motility Outcome: Adequate for Discharge Goal: Will not experience complications related to urinary retention Outcome: Adequate for Discharge   Problem: Pain  Managment: Goal: General experience of comfort will improve Outcome: Adequate for Discharge   Problem: Safety: Goal: Ability to remain free from injury will improve Outcome: Adequate for Discharge   Problem: Skin Integrity: Goal: Risk for impaired skin integrity will decrease Outcome: Adequate for Discharge   

## 2023-05-12 NOTE — Progress Notes (Signed)
Occupational Therapy Treatment Patient Details Name: Ann Long MRN: [id number] DOB: 11-26-1951 Today's Date: 05/12/2023   History of present illness 72 yo female admitted with R shoulder fx after fall. 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 Hx of R THA, osteoporosis, DVT, L THA   OT comments  Pt is s/p shoulder replacement without functional use of right dominant upper extremity secondary to effects of surgery and shoulder precautions. Therapist provided education and instruction to patient and her daughter with regards to UE ROM/exercises, post-op precautions, UE positioning, donning upper extremity clothing, recommendations for bathing while maintaining shoulder precautions, use of ice for pain and edema management, and donning/doffing sling. Patient and her daughter verbalized and demonstrated understanding as needed. Patient needed assistance to donn shirt, underwear, and pants, and with instruction provided on compensatory strategies to perform ADLs. No further OT treatment needs are identified in the acute setting. OT will sign off. Patient to follow up with MD for further therapy needs.     Recommendations for follow up therapy are one component of a multi-disciplinary discharge planning process, led by the attending physician.  Recommendations may be updated based on patient status, additional functional criteria and insurance authorization.    Assistance Recommended at Discharge Intermittent Supervision/Assistance  Patient can return home with the following  A little help with bathing/dressing/bathroom;Assistance with cooking/housework;Assist for transportation;Help with stairs or ramp for entrance   Equipment Recommendations  None recommended by OT       Precautions / Restrictions Precautions Precautions: Shoulder Type of Shoulder Precautions: NWB, conservative protocol per suregeon Shoulder Interventions: Shoulder sling/immobilizer;Off for  dressing/bathing/exercises Precaution Booklet Issued: Yes (comment) Required Braces or Orthoses: Sling Restrictions Weight Bearing Restrictions: Yes RUE Weight Bearing: Touch down weight bearing Other Position/Activity Restrictions: Okay to perform RUE elbow, wrist, and hand AROM. No R shoulder ROM       Mobility Bed Mobility          General bed mobility comments: Pt was received seated in the bedside chair    Transfers Overall transfer level: Needs assistance Equipment used: None Transfers: Sit to/from Stand Sit to Stand: Supervision                     ADL either performed or assessed with clinical judgement   ADL Overall ADL's : Needs assistance/impaired Eating/Feeding: Sitting;Set up   Grooming: Sitting;Set up Grooming Details (indicate cue type and reason): The pt performed teeth brushing in sitting at sink level. Upper Body Bathing: Minimal assistance;Sitting Upper Body Bathing Details (indicate cue type and reason): The pt required assist for contralateral bathing seated at sink, given R shoulder ROM limitations. Lower Body Bathing: Minimal assistance Lower Body Bathing Details (indicate cue type and reason): The pt performed lower body bathing seated and standing at sink level. Upper Body Dressing : Minimal assistance;Sitting Upper Body Dressing Details (indicate cue type and reason): Pt and her daughter were instructed on compensatory strategies for donning a shirt. An emphasis was placed on use of looser fitting clothing to facilitate ease with task. Lower Body Dressing: Minimal assistance;Sit to/from stand Lower Body Dressing Details (indicate cue type and reason): Pt donned her underwear and shorts over her ankles and up to her knees in sitting, then up over her hips in standing. Toilet Transfer: Supervision/safety;Ambulation Toilet Transfer Details (indicate cue type and reason): Pt ambulated to the bathroom without an assistive device. Toileting-  Clothing Manipulation and Hygiene: Supervision/safety;Sit to/from stand Toileting - Clothing Manipulation Details (indicate cue type and reason):  At bathroom level, while the pt performed anterior peri-hygiene.             Cognition Arousal/Alertness: Awake/alert Behavior During Therapy: WFL for tasks assessed/performed Overall Cognitive Status: Within Functional Limits for tasks assessed          General Comments: able to follow commands without difficulty, oriented           Shoulder Instructions Shoulder Instructions Donning/doffing shirt without moving shoulder: Minimal assistance Method for sponge bathing under operated UE: Patient able to independently direct caregiver Donning/doffing sling/immobilizer: Minimal assistance Correct positioning of sling/immobilizer: Patient able to independently direct caregiver ROM for elbow, wrist and digits of operated UE: Patient able to independently direct caregiver Sling wearing schedule (on at all times/off for ADL's): Patient able to independently direct caregiver Proper positioning of operated UE when showering: Patient able to independently direct caregiver Positioning of UE while sleeping: Patient able to independently direct caregiver     General Comments      Pertinent Vitals/ Pain       Pain Assessment Pain Assessment: 0-10 Pain Score: 3  Pain Location: R shoulder Pain Intervention(s): Limited activity within patient's tolerance, Monitored during session         Frequency   (N/A)        Progress Toward Goals  OT Goals(current goals can now be found in the care plan section)        Plan All goals met and education completed, patient discharged from OT services       AM-PAC OT "6 Clicks" Daily Activity     Outcome Measure   Help from another person eating meals?: None Help from another person taking care of personal grooming?: None Help from another person toileting, which includes using toliet,  bedpan, or urinal?: None Help from another person bathing (including washing, rinsing, drying)?: A Little Help from another person to put on and taking off regular upper body clothing?: A Little Help from another person to put on and taking off regular lower body clothing?: A Little 6 Click Score: 21    End of Session Equipment Utilized During Treatment: Other (comment) (none)  OT Visit Diagnosis: Pain;Muscle weakness (generalized) (M62.81)   Activity Tolerance Patient tolerated treatment well   Patient Left in chair;with call bell/phone within reach;with family/visitor present   Nurse Communication Mobility status        Time: 1610-9604 OT Time Calculation (min): 26 min  Charges: OT General Charges $OT Visit: 1 Visit OT Treatments $Self Care/Home Management : 23-37 mins     Reuben Likes 05/12/2023, 10:41 AM

## 2023-05-12 NOTE — Progress Notes (Signed)
Orthopedics Progress Note  Subjective: Patient feeling better this AM. Pain controlled. Still some tingling in the hand.   Objective:  Vitals:   05/11/23 2111 05/12/23 0606  BP: 121/71 108/67  Pulse: 88 78  Resp: 17 17  Temp: 98.3 F (36.8 C) 98.1 F (36.7 C)  SpO2: 97% 99%    General: Awake and alert  Musculoskeletal: Right shoulder dressing intact Neurovascularly intact  Lab Results  Component Value Date   WBC 8.5 05/11/2023   HGB 11.3 (L) 05/12/2023   HCT 34.5 (L) 05/12/2023   MCV 93.2 05/11/2023   PLT 241 05/11/2023       Component Value Date/Time   NA 134 (L) 05/11/2023 0618   NA 139 01/25/2023 0958   K 3.3 (L) 05/11/2023 0618   CL 103 05/11/2023 0618   CO2 23 05/11/2023 0618   GLUCOSE 132 (H) 05/11/2023 0618   BUN 18 05/11/2023 0618   BUN 17 01/25/2023 0958   CREATININE 0.85 05/11/2023 0618   CALCIUM 8.1 (L) 05/11/2023 0618   GFRNONAA >60 05/11/2023 0618   GFRAA >60 10/04/2019 2334    Lab Results  Component Value Date   INR 2.3 (H) 04/25/2021   INR 2.1 (H) 03/24/2021   INR 1.8 (H) 10/04/2019    Assessment/Plan: POD #2 s/p Procedure(s): REVERSE SHOULDER ARTHROPLASTY Stable this AM. Hgb stable. Discharge to home after therapy today Follow up in two weeks  Almedia Balls. Ranell Patrick, MD 05/12/2023 7:45 AM

## 2023-05-12 NOTE — Discharge Summary (Signed)
In most cases prophylactic antibiotics for Dental procdeures after total joint surgery are not necessary.  Exceptions are as follows:  1. History of prior total joint infection  2. Severely immunocompromised (Organ Transplant, cancer chemotherapy, Rheumatoid biologic meds such as Humera)  3. Poorly controlled diabetes (A1C &gt; 8.0, blood glucose over 200)  If you have one of these conditions, contact your surgeon for an antibiotic prescription, prior to your dental procedure. Orthopedic Discharge Summary        Physician Discharge Summary  Patient ID: Ann Long MRN: [id number] DOB/AGE: 1951-11-25 72 y.o.  Admit date: 05/10/2023 Discharge date: 05/12/2023   Procedures:  Procedure(s) (LRB): REVERSE SHOULDER ARTHROPLASTY (Right)  Attending Physician:  Dr. 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 , right leg  to vena cava, then one in the left leg, then one in the left kidney --all were onsearate occasions    Dyspnea    Gallbladder disease    GERD (gastroesophageal reflux disease)    Heart murmur    Hepatitis    Hep A as child   Hypertension    Jaundice    as a child, her sister was diagnosed with it due to ingestion hepatitis, she later develped januce and thpugh was not testes herself , was presumed to also have the same time of hepatitis and was subsequesntly treated with globulin '   Left bundle branch block    "my pcp told me years ago i had it , 2 -3 years ago my obgyn heard a heart murmur and sent me to [cardiology] where they did a ultrasound of my heart , they didnt find anything definitive" , denies chest pain , sob, syncope    Osteoarthritis     Osteoporosis    Phlebitis    PONV (postoperative nausea and vomiting)     PCP: Philip Aspen, Limmie Patricia, MD   Discharged Condition: good  Hospital Course:  Patient underwent the above stated procedure on 05/10/2023. Patient tolerated the procedure well and brought to the recovery room in good condition and subsequently to the floor. Patient had an uncomplicated hospital course and was stable for discharge.   Disposition: Discharge disposition: 01-Home or Self Care      with follow up in 2 weeks    Follow-up Information     Beverely Low, MD. Call in 2 week(s).   Specialty: Orthopedic Surgery Why: call [phone] for appt Contact information: [address] [place] [address] [phone]                 Dental Antibiotics:  In most cases prophylactic antibiotics for Dental procdeures after total joint surgery are not necessary.  Exceptions are as follows:  1. History of prior total joint infection  2. Severely immunocompromised (Organ Transplant, cancer chemotherapy, Rheumatoid biologic meds such as Humera)  3. Poorly controlled diabetes (A1C &gt; 8.0, blood glucose over 200)  If you have one of these conditions, contact your surgeon for an antibiotic prescription, prior to your dental procedure.  Discharge Instructions     Call MD / Call 911   Complete by: As directed  If you experience chest pain or shortness of breath, CALL 911 and be transported to the hospital emergency room.  If you develope a fever above 101 F, pus (white drainage) or increased drainage or redness at the wound, or calf pain, call your surgeon's office.   Constipation Prevention   Complete by: As directed    Drink plenty of fluids.  Prune juice may be helpful.  You may use a stool softener, such as Colace (over the counter) 100 mg twice a day.  Use MiraLax (over the counter) for constipation as needed.   Diet - low sodium heart healthy   Complete by: As  directed    Increase activity slowly as tolerated   Complete by: As directed    Post-operative opioid taper instructions:   Complete by: As directed    POST-OPERATIVE OPIOID TAPER INSTRUCTIONS: It is important to wean off of your opioid medication as soon as possible. If you do not need pain medication after your surgery it is ok to stop day one. Opioids include: Codeine, Hydrocodone(Norco, Vicodin), Oxycodone(Percocet, oxycontin) and hydromorphone amongst others.  Long term and even short term use of opiods can cause: Increased pain response Dependence Constipation Depression Respiratory depression And more.  Withdrawal symptoms can include Flu like symptoms Nausea, vomiting And more Techniques to manage these symptoms Hydrate well Eat regular healthy meals Stay active Use relaxation techniques(deep breathing, meditating, yoga) Do Not substitute Alcohol to help with tapering If you have been on opioids for less than two weeks and do not have pain than it is ok to stop all together.  Plan to wean off of opioids This plan should start within one week post op of your joint replacement. Maintain the same interval or time between taking each dose and first decrease the dose.  Cut the total daily intake of opioids by one tablet each day Next start to increase the time between doses. The last dose that should be eliminated is the evening dose.          Allergies as of 05/12/2023       Reactions   Doxycycline Diarrhea   Sulfa Antibiotics Rash        Medication List     STOP taking these medications    enoxaparin 30 MG/0.3ML injection Commonly known as: LOVENOX   oxycodone-acetaminophen 2.5-325 MG tablet Commonly known as: PERCOCET       TAKE these medications    acetaminophen 500 MG tablet Commonly known as: TYLENOL Take 500 mg by mouth every 6 (six) hours as needed for moderate pain.   ALLEGRA PO Take 180 mg by mouth daily in the afternoon.    amoxicillin 500 MG capsule Commonly known as: AMOXIL Take 2,000 mg by mouth See admin instructions. Take 2000 mg by mouth prior to dental appointments   carvedilol 3.125 MG tablet Commonly known as: COREG Take 1 tablet (3.125 mg total) by mouth 2 (two) times daily.   docusate sodium 100 MG capsule Commonly known as: COLACE Take 100 mg by mouth daily.   Entresto 24-26 MG Generic drug: sacubitril-valsartan Take 1 tablet by mouth 2 (two) times daily.   fluticasone 50 MCG/ACT nasal spray Commonly known as: FLONASE Place 1 spray into both nostrils daily as needed for allergies or rhinitis.   folic acid 1 MG tablet Commonly known as: FOLVITE Take 1 mg by mouth 3 (three) times a week.   hydrochlorothiazide 12.5 MG tablet Commonly known as: HYDRODIURIL Take 1 tablet (12.5 mg total) by  mouth daily. What changed: Another medication with the same name was removed. Continue taking this medication, and follow the directions you see here.   hyoscyamine 0.125 MG Tbdp disintergrating tablet Commonly known as: ANASPAZ DISSOLVE 1 TABLET ON THE TONGUE THREE TIMES DAILY BEFORE MEALS AS NEEDED   methocarbamol 500 MG tablet Commonly known as: ROBAXIN Take 1 tablet (500 mg total) by mouth 3 (three) times daily.   ondansetron 4 MG tablet Commonly known as: Zofran Take 1 tablet (4 mg total) by mouth every 8 (eight) hours as needed for vomiting, nausea or refractory nausea / vomiting.   oxyCODONE 5 MG immediate release tablet Commonly known as: Roxicodone Take 1 tablet (5 mg total) by mouth every 4 (four) hours as needed for severe pain or breakthrough pain.   pantoprazole 40 MG tablet Commonly known as: PROTONIX Take 1 tablet (40 mg total) by mouth daily as needed (acid reflux).   PROBIOTIC DAILY PO Take 1 capsule by mouth daily in the afternoon.   psyllium 58.6 % powder Commonly known as: METAMUCIL Take 1 packet by mouth daily.   rosuvastatin 5 MG tablet Commonly known as:  CRESTOR Take 5 mg by mouth 4 (four) times a week. What changed: Another medication with the same name was removed. Continue taking this medication, and follow the directions you see here.   VITAMIN B 12 PO Take 1,000 mcg by mouth daily.   Vitamin D 50 MCG (2000 UT) Caps Take 2,000 Units by mouth daily.   Xarelto 10 MG Tabs tablet Generic drug: rivaroxaban TAKE 1 TABLET BY MOUTH DAILY          Signed: Verlee Rossetti 05/12/2023, 7:47 AM  [facility] is now [facility]  [place] [address], [place], [place] [address] Phone: [phone] Facebook  Instagram  [employer]

## 2023-05-13 ENCOUNTER — Telehealth: Payer: Self-pay

## 2023-05-13 NOTE — Transitions of Care (Post Inpatient/ED Visit) (Signed)
05/13/2023  Name: Ann Long MRN: [id number] DOB: 22-Nov-1951  Today's TOC FU Call Status: Today's TOC FU Call Status:: Successful TOC FU Call Competed TOC FU Call Complete Date: 05/13/23  Transition Care Management Follow-up Telephone Call Date of Discharge: 05/12/23 Discharge Facility: [facility]) Type of Discharge: Inpatient Admission Primary Inpatient Discharge Diagnosis:: shoulder sugery How have you been since you were released from the hospital?: Better Any questions or concerns?: No  Items Reviewed: Did you receive and understand the discharge instructions provided?: Yes Medications obtained,verified, and reconciled?: Yes (Medications Reviewed) Any new allergies since your discharge?: No Dietary orders reviewed?: NA Do you have support at home?: Yes People in Home: child(ren), adult  Medications Reviewed Today: Medications Reviewed Today     Reviewed by Lovie Chol, CRNA (Certified Registered Nurse Anesthetist) on 05/10/23 at 1514  Med List Status: Complete   Medication Order Taking? Sig Documenting Provider Last Dose Status Informant  acetaminophen (TYLENOL) 500 MG tablet [id number] Yes Take 500 mg by mouth every 6 (six) hours as needed for moderate pain. [provider] 05/10/2023 1200 Active Spouse/Significant Other  amoxicillin (AMOXIL) 500 MG capsule [id number] Yes Take 2,000 mg by mouth See admin instructions. Take 2000 mg by mouth prior to dental appointments [provider] Past Month Active Self           Med Note Kai Levins, MELISSA B   Mon Sep 11, 2019  4:36 PM)    carvedilol (COREG) 3.125 MG tablet [id number] Yes Take 1 tablet (3.125 mg total) by mouth 2 (two) times daily. Croitoru, Mihai, MD 05/10/2023 0900 Active Self  Cholecalciferol (VITAMIN D) 50 MCG (2000 UT) CAPS [id number] Yes Take 2,000 Units by mouth daily. [provider] 05/06/2023 Active Self  Cyanocobalamin (VITAMIN B 12 PO) [id number] Yes Take 1,000 mcg by mouth  daily. [provider] 05/06/2023 Active Self  docusate sodium (COLACE) 100 MG capsule [id number] Yes Take 100 mg by mouth daily. [provider] 05/06/2023 Active Self  enoxaparin (LOVENOX) 30 MG/0.3ML injection [id number] Yes Inject 30 mg into the skin every 12 (twelve) hours. [provider] 05/09/2023 Active Spouse/Significant Other  Fexofenadine HCl (ALLEGRA PO) [id number] Yes Take 180 mg by mouth daily in the afternoon. [provider] 05/10/2023 Active Self  fluticasone (FLONASE) 50 MCG/ACT nasal spray [id number] No Place 1 spray into both nostrils daily as needed for allergies or rhinitis. [provider] More than a month Active Self  folic acid (FOLVITE) 1 MG tablet [id number] Yes Take 1 mg by mouth 3 (three) times a week. [provider] 05/06/2023 Active Self           Med Note Richardean Canal May 06, 2023  1:16 PM)    gabapentin (NEURONTIN) 300 MG capsule [id number] Yes Take 300 mg by mouth 3 (three) times daily. [provider] 05/10/2023 0800 Active Child  hydrochlorothiazide (HYDRODIURIL) 12.5 MG tablet [id number] Yes Take 1 tablet (12.5 mg total) by mouth daily. Croitoru, Rachelle Hora, MD 05/09/2023 Active Self  hydrochlorothiazide (HYDRODIURIL) 25 MG tablet [id number] Yes Take 25 mg by mouth every other day. [provider] 05/08/2023 Active Self  hyoscyamine (ANASPAZ) 0.125 MG TBDP disintergrating tablet [id number] Yes DISSOLVE 1 TABLET ON THE TONGUE THREE TIMES DAILY BEFORE MEALS AS NEEDED Philip Aspen, Limmie Patricia, MD Past Week Active Self  methocarbamol (ROBAXIN) 500 MG tablet [id number] Yes Take 500 mg by mouth 3 (three) times daily. [provider] 05/10/2023 1200 Active Child  oxycodone-acetaminophen (PERCOCET) 2.5-325  MG tablet [id number] Yes Take 1 tablet by mouth every 4 (four) hours as needed for pain. [provider] 05/10/2023 1200 Active Spouse/Significant Other  pantoprazole (PROTONIX) 40 MG tablet  [id number] Yes Take 1 tablet (40 mg total) by mouth daily as needed (acid reflux). Philip Aspen, Limmie Patricia, MD 05/10/2023 0900 Active Self  Probiotic Product (PROBIOTIC DAILY PO) [id number] Yes Take 1 capsule by mouth daily in the afternoon. [provider] 05/06/2023 Active Self  psyllium (METAMUCIL) 58.6 % powder [id number] Yes Take 1 packet by mouth daily. [provider] 05/07/2023 Active Self           Med Note Richardean Canal May 06, 2023  1:15 PM)    rosuvastatin (CRESTOR) 10 MG tablet [id number] No Take 1 tablet PO 4 days a week  Patient not taking: Reported on 05/06/2023   Ronney Asters, NP Not Taking Active Self  rosuvastatin (CRESTOR) 5 MG tablet [id number] Yes Take 5 mg by mouth 4 (four) times a week. [provider] 05/10/2023 0900 Active Self  sacubitril-valsartan (ENTRESTO) 24-26 MG [id number] Yes Take 1 tablet by mouth 2 (two) times daily. Croitoru, Rachelle Hora, MD 05/09/2023 Active Self  XARELTO 10 MG TABS tablet [id number] Yes TAKE 1 TABLET BY MOUTH DAILY Philip Aspen, Limmie Patricia, MD 05/05/2023 Active Self            Home Care and Equipment/Supplies: Were Home Health Services Ordered?: NA Any new equipment or medical supplies ordered?: NA  Functional Questionnaire: Do you need assistance with bathing/showering or dressing?: Yes Do you need assistance with meal preparation?: Yes Do you need assistance with eating?: No Do you have difficulty maintaining continence: No Do you need assistance with getting out of bed/getting out of a chair/moving?: No Do you have difficulty managing or taking your medications?: No  Follow up appointments reviewed: PCP Follow-up appointment confirmed?: NA (follow up specialist) Specialist Hospital Follow-up appointment confirmed?: Yes Date of Specialist follow-up appointment?: 05/24/23 Follow-Up Specialty Provider:: Dr Beverely Low Do you need transportation to your follow-up appointment?: No Do you understand  care options if your condition(s) worsen?: Yes-patient verbalized understanding    SIGNATURE Fredirick Maudlin [facility]

## 2023-05-14 LAB — TYPE AND SCREEN: Unit division: 0

## 2023-05-14 LAB — BPAM RBC
Blood Product Expiration Date: 202406152359
Blood Product Expiration Date: 202406152359
Unit Type and Rh: 7300

## 2023-05-18 ENCOUNTER — Encounter: Payer: Self-pay | Admitting: Cardiovascular Disease

## 2023-05-18 NOTE — Telephone Encounter (Signed)
Very likely also related to IV fluids administered around the time of surgery. Often, standard is NS at 125 ml/h on orthopedic patients. If no dyspnea or edema, getting rid of the fluid is not urgent. Just avoid salt in diet and avoid taking NSAIDs (the robaxin and opiates are OK if needed). Would expect weight to decrease naturally, gradually over a couple of weeks. If no change for the better in next 2-3 days, we can presribe a low dose of furosemide for a few days, that will be more effective than increasing the HCTZ)

## 2023-06-02 ENCOUNTER — Other Ambulatory Visit: Payer: Self-pay | Admitting: Internal Medicine

## 2023-06-30 ENCOUNTER — Ambulatory Visit: Payer: Medicare Other | Admitting: Internal Medicine

## 2023-08-05 ENCOUNTER — Encounter (INDEPENDENT_AMBULATORY_CARE_PROVIDER_SITE_OTHER): Payer: Self-pay

## 2023-08-25 ENCOUNTER — Ambulatory Visit (INDEPENDENT_AMBULATORY_CARE_PROVIDER_SITE_OTHER): Payer: Medicare Other | Admitting: Internal Medicine

## 2023-08-25 VITALS — BP 118/72 | HR 60 | Temp 97.7°F | Wt 223.7 lb

## 2023-08-25 DIAGNOSIS — Z86718 Personal history of other venous thrombosis and embolism: Secondary | ICD-10-CM | POA: Diagnosis not present

## 2023-08-25 DIAGNOSIS — I519 Heart disease, unspecified: Secondary | ICD-10-CM | POA: Diagnosis not present

## 2023-08-25 DIAGNOSIS — I10 Essential (primary) hypertension: Secondary | ICD-10-CM

## 2023-08-25 DIAGNOSIS — M810 Age-related osteoporosis without current pathological fracture: Secondary | ICD-10-CM

## 2023-08-25 DIAGNOSIS — E785 Hyperlipidemia, unspecified: Secondary | ICD-10-CM | POA: Diagnosis not present

## 2023-08-25 DIAGNOSIS — Z96611 Presence of right artificial shoulder joint: Secondary | ICD-10-CM

## 2023-08-25 DIAGNOSIS — Z23 Encounter for immunization: Secondary | ICD-10-CM | POA: Diagnosis not present

## 2023-08-25 NOTE — Assessment & Plan Note (Signed)
Well-controlled with an LDL of 66 at last check on rosuvastatin 5 mg.

## 2023-08-25 NOTE — Assessment & Plan Note (Signed)
Noted, due for repeat DEXA scan.

## 2023-08-25 NOTE — Assessment & Plan Note (Signed)
Well controlled on current

## 2023-08-25 NOTE — Assessment & Plan Note (Signed)
On lifelong anticoagulation, currently on Xarelto.

## 2023-08-25 NOTE — Progress Notes (Signed)
Established Patient Office Visit     CC/Reason for Visit: Follow-up chronic conditions  HPI: Ann Long is a 72 y.o. female who is coming in today for the above mentioned reasons. 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  She suffered a fall at home in May ended up fracturing her right shoulder and needing a replacement.  Recovery has been slow, she continues to attend PT.  She is otherwise feeling well.   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 , right leg  to vena cava, then one in the left leg, then one in the left kidney --all were onsearate occasions    Dyspnea    Gallbladder disease    GERD (gastroesophageal reflux disease)    Heart murmur    Hepatitis    Hep A as child   Hypertension    Jaundice    as a child, her sister was diagnosed with it due to ingestion hepatitis, she later develped januce and thpugh was not testes herself , was presumed to also have the same time of hepatitis and was subsequesntly treated with globulin '   Left bundle branch block    "my pcp told me years ago i had it , 2 -3 years ago my obgyn heard a heart murmur and sent me to [cardiology] where they did a ultrasound of my heart , they didnt find anything definitive" , denies chest pain , sob, syncope    Osteoarthritis    Osteoporosis    Phlebitis    PONV (postoperative nausea and vomiting)     Past Surgical History:  Procedure Laterality Date   ABLATION  uterine   ?1995   CATARACT EXTRACTION W/ INTRAOCULAR LENS IMPLANT Bilateral    CHOLECYSTECTOMY  over 10 years ago   REPLACEMENT TOTAL HIP W/  RESURFACING IMPLANTS     REVERSE  SHOULDER ARTHROPLASTY Right 05/10/2023   Procedure: REVERSE SHOULDER ARTHROPLASTY;  Surgeon: Beverely Low, MD;  Location: [facility];  Service: Orthopedics;  Laterality: Right;  interscalene block 110   TOTAL HIP ARTHROPLASTY Left 2009   at [place]    TOTAL HIP ARTHROPLASTY Right 09/20/2019   Procedure: TOTAL HIP ARTHROPLASTY ANTERIOR APPROACH;  Surgeon: Ollen Gross, MD;  Location: [facility];  Service: Orthopedics;  Laterality: Right;    TUBAL LIGATION  1984    Social History:  reports that she has never smoked. She has never used smokeless tobacco. She reports that she does not drink alcohol and does not use drugs.  Allergies: Allergies  Allergen Reactions   Doxycycline Diarrhea   Sulfa Antibiotics Rash    Family History:  Family History  Problem Relation Age of Onset   Hypertension Mother    Stroke Mother 69   Osteoporosis Mother    Heart disease Mother        in chronic a fib   Heart attack Father 29       fatal   Stroke Father 72   Diabetes Father        ?not diagnosed   Heart attack Sister    Diabetes Sister    Hypertension Sister    Kidney failure  Sister    Atrial fibrillation Sister    Heart attack Sister 55   Heart disease Sister        enlarged heart   Hypertension Sister    Diabetes Sister    Heart attack Paternal Grandfather 82   Stroke Sister 59   Hypertension Sister    Atrial fibrillation Sister        multiple conversions   Diabetes Brother    Blindness Maternal Grandmother    Deafness Maternal Grandmother    Heart failure Maternal Grandmother 95   Hypertension Maternal Grandmother    Stomach cancer Maternal Grandfather    Hypertension Maternal Grandfather    Heart disease Paternal Uncle 20   Diabetes Paternal Uncle      Current Outpatient Medications:    acetaminophen (TYLENOL) 500 MG tablet, Take 500 mg by mouth every 6 (six) hours as needed for moderate pain., Disp: , Rfl:    amoxicillin (AMOXIL) 500 MG capsule, Take 2,000 mg by  mouth See admin instructions. Take 2000 mg by mouth prior to dental appointments, Disp: , Rfl:    carvedilol (COREG) 3.125 MG tablet, Take 1 tablet (3.125 mg total) by mouth 2 (two) times daily., Disp: 180 tablet, Rfl: 3   Cholecalciferol (VITAMIN D) 50 MCG (2000 UT) CAPS, Take 2,000 Units by mouth daily., Disp: , Rfl:    Cyanocobalamin (VITAMIN B 12 PO), Take 1,000 mcg by mouth daily., Disp: , Rfl:    docusate sodium (COLACE) 100 MG capsule, Take 100 mg by mouth daily., Disp: , Rfl:    Fexofenadine HCl (ALLEGRA PO), Take 180 mg by mouth daily in the afternoon., Disp: , Rfl:    fluticasone (FLONASE) 50 MCG/ACT nasal spray, Place 1 spray into both nostrils daily as needed for allergies or rhinitis., Disp: , Rfl:    folic acid (FOLVITE) 1 MG tablet, Take 1 mg by mouth 3 (three) times a week., Disp: , Rfl:    hydrochlorothiazide (HYDRODIURIL) 12.5 MG tablet, Take 1 tablet (12.5 mg total) by mouth daily., Disp: 90 tablet, Rfl: 3   hyoscyamine (ANASPAZ) 0.125 MG TBDP disintergrating tablet, DISSOLVE 1 TABLET ON THE TONGUE THREE TIMES DAILY BEFORE MEALS AS NEEDED, Disp: 270 tablet, Rfl: 1   pantoprazole (PROTONIX) 40 MG tablet, TAKE 1 TABLET BY MOUTH DAILY AS  NEEDED FOR ACID REFLUX, Disp: 90 tablet, Rfl: 1   Probiotic Product (PROBIOTIC DAILY PO), Take 1 capsule by mouth daily in the afternoon., Disp: , Rfl:    psyllium (METAMUCIL) 58.6 % powder, Take 1 packet by mouth daily., Disp: , Rfl:    sacubitril-valsartan (ENTRESTO) 24-26 MG, Take 1 tablet by mouth 2 (two) times daily., Disp: 180 tablet, Rfl: 1   XARELTO 10 MG TABS tablet, TAKE 1 TABLET BY MOUTH DAILY, Disp: 90 tablet, Rfl: 3   methocarbamol (ROBAXIN) 500 MG tablet, Take 1 tablet (500 mg total) by mouth 3 (three) times daily. (Patient not taking: Reported on 08/25/2023), Disp: 40 tablet, Rfl: 1   ondansetron (ZOFRAN) 4 MG tablet, Take 1 tablet (4 mg total) by mouth every 8 (eight) hours as needed for vomiting, nausea or refractory nausea / vomiting.  (Patient not taking: Reported on 05/13/2023), Disp: 30 tablet, Rfl: 1   oxyCODONE (ROXICODONE) 5 MG immediate release tablet, Take 1 tablet (5 mg total) by mouth every 4 (four) hours as needed for severe pain or breakthrough pain. (Patient not taking: Reported on 08/25/2023), Disp: 30 tablet, Rfl: 0   rosuvastatin (CRESTOR) 5 MG tablet, Take 5 mg  by mouth 4 (four) times a week., Disp: , Rfl:   Review of Systems:  Negative unless indicated in HPI.   Physical Exam: Vitals:   08/25/23 0853  BP: 118/72  Pulse: 60  Temp: 97.7 F (36.5 C)  TempSrc: Oral  SpO2: 98%  Weight: 223 lb 11.2 oz (101.5 kg)    Body mass index is 35.04 kg/m.   Physical Exam Vitals reviewed.  Constitutional:      Appearance: Normal appearance.  HENT:     Head: Normocephalic and atraumatic.  Eyes:     Conjunctiva/sclera: Conjunctivae normal.     Pupils: Pupils are equal, round, and reactive to light.  Cardiovascular:     Rate and Rhythm: Normal rate and regular rhythm.  Pulmonary:     Effort: Pulmonary effort is normal.     Breath sounds: Normal breath sounds.  Skin:    General: Skin is warm and dry.  Neurological:     General: No focal deficit present.     Mental Status: She is alert and oriented to person, place, and time.  Psychiatric:        Mood and Affect: Mood normal.        Behavior: Behavior normal.        Thought Content: Thought content normal.        Judgment: Judgment normal.      Impression and Plan:  Need for immunization against influenza -     Flu Vaccine Trivalent High Dose (Fluad)  Impaired left ventricular relaxation Assessment & Plan: Followed by cardiology, on Entresto and carvedilol, could not tolerate Farxiga due to worsening kidney function.   Hypertension, unspecified type Assessment & Plan: Well-controlled on current.   Hyperlipidemia, unspecified hyperlipidemia type Assessment & Plan: Well-controlled with an LDL of 66 at last check on rosuvastatin 5  mg.   History of DVT (deep vein thrombosis) Assessment & Plan: On lifelong anticoagulation, currently on Xarelto.   Osteoporosis, unspecified osteoporosis type, unspecified pathological fracture presence Assessment & Plan: Noted, due for repeat DEXA scan.   S/P shoulder replacement, right Assessment & Plan: In May 2024, noted.   -Flu vaccine administered in office today.   Time spent:32 minutes reviewing chart, interviewing and examining patient and formulating plan of care.     Chaya Jan, MD [facility]

## 2023-08-25 NOTE — Assessment & Plan Note (Signed)
In May 2024, noted.

## 2023-08-25 NOTE — Assessment & Plan Note (Signed)
Followed by cardiology, on Entresto and carvedilol, could not tolerate Farxiga due to worsening kidney function.

## 2023-09-07 ENCOUNTER — Other Ambulatory Visit: Payer: Self-pay | Admitting: Cardiovascular Disease

## 2023-10-03 ENCOUNTER — Other Ambulatory Visit: Payer: Self-pay | Admitting: General Practice

## 2023-10-08 ENCOUNTER — Ambulatory Visit (INDEPENDENT_AMBULATORY_CARE_PROVIDER_SITE_OTHER): Payer: Medicare Other

## 2023-10-08 VITALS — Ht 67.0 in | Wt 223.0 lb

## 2023-10-08 DIAGNOSIS — Z0000 Encounter for general adult medical examination without abnormal findings: Secondary | ICD-10-CM

## 2023-10-08 NOTE — Progress Notes (Signed)
Subjective:   Ann Long is a 72 y.o. female who presents for Medicare Annual (Subsequent) preventive examination.  Visit Complete: Virtual I connected with  Ann Long on 10/08/23 by a audio enabled telemedicine application and verified that I am speaking with the correct person using two identifiers.  Patient Location: Home  Provider Location: Home Office  I discussed the limitations of evaluation and management by telemedicine. The patient expressed understanding and agreed to proceed.  Vital Signs: Because this visit was a virtual/telehealth visit, some criteria may be missing or patient reported. Any vitals not documented were not able to be obtained and vitals that have been documented are patient reported.    Cardiac Risk Factors include: advanced age (>7men, >61 women);hypertension     Objective:    Today's Vitals   10/08/23 0921  Weight: 223 lb (101.2 kg)  Height: 5\' 7"  (1.702 m)   Body mass index is 34.93 kg/m.     10/08/2023    9:28 AM 05/10/2023    2:23 PM 05/06/2023    1:01 PM 08/07/2021    9:04 AM 10/04/2019    9:17 PM 09/20/2019    3:00 PM 09/14/2019    9:35 AM  Advanced Directives  Does Patient Have a Medical Advance Directive? Yes Yes Yes No No No No  Type of Estate agent of [place];Living will Healthcare Power of [employer] Power of Attorney      Does patient want to make changes to medical advance directive?  No - Patient declined       Copy of Healthcare Power of Attorney in Chart? No - copy requested No - copy requested No - copy requested      Would patient like information on creating a medical advance directive?    No - Patient declined  No - Patient declined No - Patient declined    Current Medications (verified) Outpatient Encounter Medications as of 10/08/2023  Medication Sig   acetaminophen (TYLENOL) 500 MG tablet Take 500 mg by mouth every 6 (six) hours as needed for moderate pain.   amoxicillin  (AMOXIL) 500 MG capsule Take 2,000 mg by mouth See admin instructions. Take 2000 mg by mouth prior to dental appointments   carvedilol (COREG) 3.125 MG tablet TAKE 1 TABLET BY MOUTH TWICE  DAILY   Cholecalciferol (VITAMIN D) 50 MCG (2000 UT) CAPS Take 2,000 Units by mouth daily.   Cyanocobalamin (VITAMIN B 12 PO) Take 1,000 mcg by mouth daily.   docusate sodium (COLACE) 100 MG capsule Take 100 mg by mouth daily.   Fexofenadine HCl (ALLEGRA PO) Take 180 mg by mouth daily in the afternoon.   fluticasone (FLONASE) 50 MCG/ACT nasal spray Place 1 spray into both nostrils daily as needed for allergies or rhinitis.   folic acid (FOLVITE) 1 MG tablet Take 1 mg by mouth 3 (three) times a week.   hydrochlorothiazide (HYDRODIURIL) 12.5 MG tablet Take 1 tablet (12.5 mg total) by mouth daily.   hyoscyamine (ANASPAZ) 0.125 MG TBDP disintergrating tablet DISSOLVE 1 TABLET ON THE TONGUE THREE TIMES DAILY BEFORE MEALS AS NEEDED   methocarbamol (ROBAXIN) 500 MG tablet Take 1 tablet (500 mg total) by mouth 3 (three) times daily. (Patient not taking: Reported on 08/25/2023)   ondansetron (ZOFRAN) 4 MG tablet Take 1 tablet (4 mg total) by mouth every 8 (eight) hours as needed for vomiting, nausea or refractory nausea / vomiting. (Patient not taking: Reported on 05/13/2023)   oxyCODONE (ROXICODONE) 5 MG immediate  release tablet Take 1 tablet (5 mg total) by mouth every 4 (four) hours as needed for severe pain or breakthrough pain. (Patient not taking: Reported on 08/25/2023)   pantoprazole (PROTONIX) 40 MG tablet TAKE 1 TABLET BY MOUTH DAILY AS  NEEDED FOR ACID REFLUX   Probiotic Product (PROBIOTIC DAILY PO) Take 1 capsule by mouth daily in the afternoon.   psyllium (METAMUCIL) 58.6 % powder Take 1 packet by mouth daily.   sacubitril-valsartan (ENTRESTO) 24-26 MG Take 1 tablet by mouth 2 (two) times daily.   XARELTO 10 MG TABS tablet TAKE 1 TABLET BY MOUTH DAILY   [DISCONTINUED] rosuvastatin (CRESTOR) 5 MG tablet Take 5 mg  by mouth 4 (four) times a week.   No facility-administered encounter medications on file as of 10/08/2023.    Allergies (verified) Doxycycline and Sulfa antibiotics   History: Past Medical History:  Diagnosis Date   Abnormal vaginal Pap smear    Arthritis    CHF (congestive heart failure) (HCC)    Coronary artery disease    Deep vein thrombosis (DVT) (HCC)    reports they were sporadic , right leg  to vena cava, then one in the left leg, then one in the left kidney --all were onsearate occasions    Dyspnea    Gallbladder disease    GERD (gastroesophageal reflux disease)    Heart murmur    Hepatitis    Hep A as child   Hypertension    Jaundice    as a child, her sister was diagnosed with it due to ingestion hepatitis, she later develped januce and thpugh was not testes herself , was presumed to also have the same time of hepatitis and was subsequesntly treated with globulin '   Left bundle branch block    "my pcp told me years ago i had it , 2 -3 years ago my obgyn heard a heart murmur and sent me to [cardiology] where they did a ultrasound of my heart , they didnt find anything definitive" , denies chest pain , sob, syncope    Osteoarthritis    Osteoporosis    Phlebitis    PONV (postoperative nausea and vomiting)    Past Surgical History:  Procedure Laterality Date   ABLATION  uterine   ?1995   CATARACT EXTRACTION W/ INTRAOCULAR LENS IMPLANT Bilateral    CHOLECYSTECTOMY  over 10 years ago   REPLACEMENT TOTAL HIP W/  RESURFACING IMPLANTS     REVERSE SHOULDER ARTHROPLASTY Right 05/10/2023   Procedure: REVERSE SHOULDER ARTHROPLASTY;  Surgeon: Beverely Low, MD;  Location: [facility];  Service: Orthopedics;  Laterality: Right;  interscalene block 110   TOTAL HIP ARTHROPLASTY Left 2009   at [place]    TOTAL HIP ARTHROPLASTY Right 09/20/2019   Procedure: TOTAL HIP ARTHROPLASTY ANTERIOR APPROACH;  Surgeon: Ollen Gross, MD;  Location: [facility];  Service: Orthopedics;  Laterality:  Right;    TUBAL LIGATION  1984   Family History  Problem Relation Age of Onset   Hypertension Mother    Stroke Mother 2   Osteoporosis Mother    Heart disease Mother        in chronic a fib   Heart attack Father 39       fatal   Stroke Father 12   Diabetes Father        ?not diagnosed   Heart attack Sister    Diabetes Sister    Hypertension Sister    Kidney failure Sister    Atrial  fibrillation Sister    Heart attack Sister 83   Heart disease Sister        enlarged heart   Hypertension Sister    Diabetes Sister    Heart attack Paternal Grandfather 10   Stroke Sister 46   Hypertension Sister    Atrial fibrillation Sister        multiple conversions   Diabetes Brother    Blindness Maternal Grandmother    Deafness Maternal Grandmother    Heart failure Maternal Grandmother 95   Hypertension Maternal Grandmother    Stomach cancer Maternal Grandfather    Hypertension Maternal Grandfather    Heart disease Paternal Uncle 39   Diabetes Paternal Uncle    Social History   Socioeconomic History   Marital status: Widowed    Spouse name: Not on file   Number of children: Not on file   Years of education: Not on file   Highest education level: Not on file  Occupational History   Not on file  Tobacco Use   Smoking status: Never   Smokeless tobacco: Never  Vaping Use   Vaping status: Never Used  Substance and Sexual Activity   Alcohol use: No   Drug use: No   Sexual activity: Not on file  Other Topics Concern   Not on file  Social History Narrative   Not on file   Social Determinants of Health   Financial Resource Strain: Low Risk  (10/08/2023)   Overall Financial Resource Strain (CARDIA)    Difficulty of Paying Living Expenses: Not hard at all  Food Insecurity: No Food Insecurity (10/08/2023)   Hunger Vital Sign    Worried About Running Out of Food in the Last Year: Never true    Ran Out of Food in the Last Year: Never true  Transportation Needs: No  Transportation Needs (10/08/2023)   PRAPARE - Administrator, Civil Service (Medical): No    Lack of Transportation (Non-Medical): No  Physical Activity: Sufficiently Active (10/08/2023)   Exercise Vital Sign    Days of Exercise per Week: 5 days    Minutes of Exercise per Session: 60 min  Stress: No Stress Concern Present (10/08/2023)   [employer] of Occupational Health - Occupational Stress Questionnaire    Feeling of Stress : Not at all  Social Connections: Moderately Integrated (10/08/2023)   Social Connection and Isolation Panel [NHANES]    Frequency of Communication with Friends and Family: More than three times a week    Frequency of Social Gatherings with Friends and Family: More than three times a week    Attends Religious Services: More than 4 times per year    Active Member of [employer] or Organizations: Yes    Attends Banker Meetings: More than 4 times per year    Marital Status: Widowed    Tobacco Counseling Counseling given: Not Answered   Clinical Intake:  Pre-visit preparation completed: Yes  Pain : No/denies pain     BMI - recorded: 34.93 Nutritional Status: BMI > 30  Obese Nutritional Risks: None Diabetes: No  How often do you need to have someone help you when you read instructions, pamphlets, or other written materials from your doctor or pharmacy?: 1 - Never  Interpreter Needed?: No  Information entered by :: Theresa Mulligan LPN   Activities of Daily Living    10/08/2023    9:26 AM 05/10/2023   11:35 PM  In your present state of health, do you have any  difficulty performing the following activities:  Hearing? 0 0  Vision? 0 0  Difficulty concentrating or making decisions? 0 0  Walking or climbing stairs? 0 0  Dressing or bathing? 0 0  Doing errands, shopping? 0 0  Preparing Food and eating ? N   Using the Toilet? N   In the past six months, have you accidently leaked urine? N   Do you have problems with loss of  bowel control? N   Managing your Medications? N   Managing your Finances? N   Housekeeping or managing your Housekeeping? N     Patient Care Team: Philip Aspen, Limmie Patricia, MD as PCP - General (Internal Medicine) Croitoru, Rachelle Hora, MD as PCP - Cardiology (Cardiology) Richardean Chimera, MD as Consulting Physician (Obstetrics and Gynecology) Kerin Salen, MD as Consulting Physician (Gastroenterology)  Indicate any recent Medical Services you may have received from other than [facility] providers in the past year (date may be approximate).     Assessment:   This is a routine wellness examination for Ann Long.  Hearing/Vision screen Hearing Screening - Comments:: Denies hearing difficulties   Vision Screening - Comments:: Wears reading glasses - up to date with routine eye exams with  Dr Randon Goldsmith   Goals Addressed               This Visit's Progress     Increase physical activity (pt-stated)        Lose weight.       Depression Screen    10/08/2023    9:25 AM 09/29/2022    9:24 AM 04/06/2022   10:30 AM 08/07/2021    9:05 AM 08/07/2021    9:02 AM  PHQ 2/9 Scores  PHQ - 2 Score 0 1 1 0 0  PHQ- 9 Score  2 3      Fall Risk    10/08/2023    9:27 AM 09/29/2022    9:24 AM 08/07/2021    9:05 AM  Fall Risk   Falls in the past year? 1 0 0  Number falls in past yr: 0 0 0  Injury with Fall? 1 0 0  Comment Fx: Rt shoulder. Followed by medical attention    Risk for fall due to :  No Fall Risks No Fall Risks  Follow up Falls prevention discussed Falls evaluation completed Falls evaluation completed    MEDICARE RISK AT HOME: Medicare Risk at Home Any stairs in or around the home?: No If so, are there any without handrails?: No Home free of loose throw rugs in walkways, pet beds, electrical cords, etc?: Yes Adequate lighting in your home to reduce risk of falls?: Yes Life alert?: No Use of a cane, walker or w/c?: No Grab bars in the bathroom?: No Shower chair or bench in shower?:  No Elevated toilet seat or a handicapped toilet?: No  TIMED UP AND GO:  Was the test performed?  No    Cognitive Function:        10/08/2023    9:28 AM  6CIT Screen  What Year? 0 points  What month? 0 points  What time? 0 points  Count back from 20 0 points  Months in reverse 0 points  Repeat phrase 0 points  Total Score 0 points    Immunizations Immunization History  Administered Date(s) Administered   Fluad Quad(high Dose 65+) 09/24/2021, 09/29/2022   Fluad Trivalent(High Dose 65+) 08/25/2023   Influenza, High Dose Seasonal PF 09/13/2017, 09/07/2018, 09/01/2019, 10/08/2020   Moderna Sars-Covid-2 Vaccination  02/24/2021, 08/11/2021   PFIZER(Purple Top)SARS-COV-2 Vaccination 12/22/2019, 01/22/2020   Pneumococcal Conjugate-13 12/29/2016   Pneumococcal Polysaccharide-23 07/20/2018   Td 07/20/2018   Zoster Recombinant(Shingrix) 01/21/2022, 03/31/2022   Zoster, Live 08/30/2012    TDAP status: Up to date  Flu Vaccine status: Up to date  Pneumococcal vaccine status: Up to date  Covid-19 vaccine status: Declined, Education has been provided regarding the importance of this vaccine but patient still declined. Advised may receive this vaccine at local pharmacy or Health Dept.or vaccine clinic. Aware to provide a copy of the vaccination record if obtained from local pharmacy or Health Dept. Verbalized acceptance and understanding.  Qualifies for Shingles Vaccine? Yes   Zostavax completed Yes   Shingrix Completed?: Yes  Screening Tests Health Maintenance  Topic Date Due   DEXA SCAN  Never done   MAMMOGRAM  08/07/2022   COVID-19 Vaccine (5 - 2023-24 season) 08/22/2023   Medicare Annual Wellness (AWV)  10/07/2024   DTaP/Tdap/Td (2 - Tdap) 07/20/2028   Colonoscopy  03/24/2030   Pneumonia Vaccine 20+ Years old  Completed   INFLUENZA VACCINE  Completed   Hepatitis C Screening  Completed   Zoster Vaccines- Shingrix  Completed   HPV VACCINES  Aged Out    Health  Maintenance  Health Maintenance Due  Topic Date Due   DEXA SCAN  Never done   MAMMOGRAM  08/07/2022   COVID-19 Vaccine (5 - 2023-24 season) 08/22/2023    Colorectal cancer screening: Type of screening: Colonoscopy. Completed 03/24/20. Repeat every 10 years  Mammogram status: Ordered Deferred. Pt provided with contact info and advised to call to schedule appt.   Bone Density status: Ordered Deferred. Pt provided with contact info and advised to call to schedule appt.    Additional Screening:  Hepatitis C Screening: does qualify; Completed 04/06/22  Vision Screening: Recommended annual ophthalmology exams for early detection of glaucoma and other disorders of the eye. Is the patient up to date with their annual eye exam?  Yes  Who is the provider or what is the name of the office in which the patient attends annual eye exams? Dr Randon Goldsmith If pt is not established with a provider, would they like to be referred to a provider to establish care? No .   Dental Screening: Recommended annual dental exams for proper oral hygiene    Community Resource Referral / Chronic Care Management:  CRR required this visit?  No   CCM required this visit?  No     Plan:     I have personally reviewed and noted the following in the patient's chart:   Medical and social history Use of alcohol, tobacco or illicit drugs  Current medications and supplements including opioid prescriptions. Patient is not currently taking opioid prescriptions. Functional ability and status Nutritional status Physical activity Advanced directives List of other physicians Hospitalizations, surgeries, and ER visits in previous 12 months Vitals Screenings to include cognitive, depression, and falls Referrals and appointments  In addition, I have reviewed and discussed with patient certain preventive protocols, quality metrics, and best practice recommendations. A written personalized care plan for preventive services  as well as general preventive health recommendations were provided to patient.     Tillie Rung, LPN   [id number]   After Visit Summary: (MyChart) Due to this being a telephonic visit, the after visit summary with patients personalized plan was offered to patient via MyChart   Nurse Notes: None

## 2023-10-08 NOTE — Patient Instructions (Addendum)
Ann Long , Thank you for taking time to come for your Medicare Wellness Visit. I appreciate your ongoing commitment to your health goals. Please review the following plan we discussed and let me know if I can assist you in the future.   Referrals/Orders/Follow-Ups/Clinician Recommendations:   This is a list of the screening recommended for you and due dates:  Health Maintenance  Topic Date Due   DEXA scan (bone density measurement)  Never done   Mammogram  08/07/2022   COVID-19 Vaccine (5 - 2023-24 season) 08/22/2023   Medicare Annual Wellness Visit  10/07/2024   DTaP/Tdap/Td vaccine (2 - Tdap) 07/20/2028   Colon Cancer Screening  03/24/2030   Pneumonia Vaccine  Completed   Flu Shot  Completed   Hepatitis C Screening  Completed   Zoster (Shingles) Vaccine  Completed   HPV Vaccine  Aged Out    Advanced directives: (Copy Requested) Please bring a copy of your health care power of attorney and living will to the office to be added to your chart at your convenience.  Next Medicare Annual Wellness Visit scheduled for next year: Yes

## 2023-10-19 ENCOUNTER — Other Ambulatory Visit: Payer: Self-pay | Admitting: Cardiovascular Disease

## 2023-10-19 DIAGNOSIS — I5042 Chronic combined systolic (congestive) and diastolic (congestive) heart failure: Secondary | ICD-10-CM

## 2023-10-21 LAB — HM PAP SMEAR: HPV, high-risk: NEGATIVE

## 2023-10-29 ENCOUNTER — Telehealth: Payer: Self-pay | Admitting: Internal Medicine

## 2023-10-29 NOTE — Telephone Encounter (Signed)
Prescription Request  10/29/2023  LOV: 08/25/2023  What is the name of the medication or equipment? Rosuvastatins  10 mg  Have you contacted your pharmacy to request a refill? No   Which pharmacy would you like this sent to?  [facility] - [place], [place] - [address] [place] [address] Phone: [phone] Fax: [phone]    Patient notified that their request is being sent to the clinical staff for review and that they should receive a response within 2 business days.  Pt stated the heart Dr said it was remove from her medication list . Please advise at Mobile [phone] (mobile)

## 2023-11-01 NOTE — Telephone Encounter (Signed)
Pt called, returning CMA's call. CMA was unavailable. Pt asked that CMA call back at her earliest convenience. 

## 2023-11-01 NOTE — Telephone Encounter (Signed)
Attempted to reach pt. Left a voicemail to call us back.  

## 2023-11-01 NOTE — Telephone Encounter (Signed)
Spoke to Ann Long.   Ann Long reports Dr. Hassan Rowan started her on the Rx in 2022. Her cardiologist did refill it last time. Ann Long continues that Rx got removed on 10/08/2023 when she had her annual telephone visit with [place]. She did contact her cardiologist to refill but it wasn't on current med list so they couldn't fill it.  Ann Long states she thinks its best for Dr. Ardyth Harps to refill it since her PCP started her on it.   Ann Long states she is taking Rosuvastatin 10mg  4x a wk. Currently has 2 wks worth left. Please advise.

## 2023-11-03 MED ORDER — ROSUVASTATIN CALCIUM 10 MG PO TABS: ORAL_TABLET | ORAL | 0 refills | Status: DC

## 2023-11-03 NOTE — Telephone Encounter (Signed)
Rx sent 

## 2023-11-11 ENCOUNTER — Ambulatory Visit: Payer: Medicare Other | Admitting: Internal Medicine

## 2023-11-11 ENCOUNTER — Encounter: Payer: Self-pay | Admitting: Internal Medicine

## 2023-11-11 VITALS — BP 150/90 | HR 67 | Temp 98.4°F | Ht 67.0 in | Wt 223.7 lb

## 2023-11-11 DIAGNOSIS — J358 Other chronic diseases of tonsils and adenoids: Secondary | ICD-10-CM

## 2023-11-11 DIAGNOSIS — J039 Acute tonsillitis, unspecified: Secondary | ICD-10-CM

## 2023-11-11 DIAGNOSIS — Z09 Encounter for follow-up examination after completed treatment for conditions other than malignant neoplasm: Secondary | ICD-10-CM

## 2023-11-11 NOTE — Progress Notes (Signed)
Established Patient Office Visit     CC/Reason for Visit: ED follow-up  HPI: Ann Long is a 72 y.o. female who is coming in today for the above mentioned reasons.  She went to an outside urgent care on November 18 with tonsillar edema and exudate.  She never had a sore throat, difficulty swallowing or breathing, no drooling.  She will frequently have tonsil stones and she had the same this time.  When seen in the urgent care she was given prednisone and clindamycin and asked to follow-up with me.   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 , right leg  to vena cava, then one in the left leg, then one in the left kidney --all were onsearate occasions    Dyspnea    Gallbladder disease    GERD (gastroesophageal reflux disease)    Heart murmur    Hepatitis    Hep A as child   Hypertension    Jaundice    as a child, her sister was diagnosed with it due to ingestion hepatitis, she later develped januce and thpugh was not testes herself , was presumed to also have the same time of hepatitis and was subsequesntly treated with globulin '   Left bundle branch block    "my pcp told me years ago i had it , 2 -3 years ago my obgyn heard a heart murmur and sent me to [cardiology] where they did a ultrasound of my heart , they didnt find anything definitive" , denies chest pain , sob, syncope    Osteoarthritis    Osteoporosis    Phlebitis    PONV (postoperative nausea and vomiting)     Past Surgical History:  Procedure Laterality Date   ABLATION  uterine   ?1995   CATARACT EXTRACTION W/ INTRAOCULAR LENS IMPLANT Bilateral    CHOLECYSTECTOMY  over 10 years ago   REPLACEMENT TOTAL HIP W/  RESURFACING IMPLANTS     REVERSE SHOULDER ARTHROPLASTY Right 05/10/2023   Procedure: REVERSE  SHOULDER ARTHROPLASTY;  Surgeon: Beverely Low, MD;  Location: [facility];  Service: Orthopedics;  Laterality: Right;  interscalene block 110   TOTAL HIP ARTHROPLASTY Left 2009   at [facility]    TOTAL HIP ARTHROPLASTY Right 09/20/2019   Procedure: TOTAL HIP ARTHROPLASTY ANTERIOR APPROACH;  Surgeon: Ollen Gross, MD;  Location: [facility];  Service: Orthopedics;  Laterality: Right;    TUBAL LIGATION  1984    Social History:  reports that she has never smoked. She has never used smokeless tobacco. She reports that she does not drink alcohol and does not use drugs.  Allergies: Allergies  Allergen Reactions   Doxycycline Diarrhea   Sulfa Antibiotics Rash    Family History:  Family History  Problem Relation Age of Onset   Hypertension Mother    Stroke Mother 68   Osteoporosis Mother    Heart disease Mother        in chronic a fib   Heart attack Father 49       fatal   Stroke Father 38   Diabetes Father        ?not diagnosed   Heart attack Sister    Diabetes Sister    Hypertension Sister  Kidney failure Sister    Atrial fibrillation Sister    Heart attack Sister 45   Heart disease Sister        enlarged heart   Hypertension Sister    Diabetes Sister    Heart attack Paternal Grandfather 57   Stroke Sister 74   Hypertension Sister    Atrial fibrillation Sister        multiple conversions   Diabetes Brother    Blindness Maternal Grandmother    Deafness Maternal Grandmother    Heart failure Maternal Grandmother 95   Hypertension Maternal Grandmother    Stomach cancer Maternal Grandfather    Hypertension Maternal Grandfather    Heart disease Paternal Uncle 57   Diabetes Paternal Uncle      Current Outpatient Medications:    acetaminophen (TYLENOL) 500 MG tablet, Take 500 mg by mouth every 6 (six) hours as needed for moderate pain., Disp: , Rfl:    amoxicillin (AMOXIL) 500 MG capsule, Take 2,000 mg by mouth See admin instructions. Take 2000 mg by mouth prior to  dental appointments, Disp: , Rfl:    carvedilol (COREG) 3.125 MG tablet, TAKE 1 TABLET BY MOUTH TWICE  DAILY, Disp: 180 tablet, Rfl: 3   Cholecalciferol (VITAMIN D) 50 MCG (2000 UT) CAPS, Take 2,000 Units by mouth daily., Disp: , Rfl:    clindamycin (CLEOCIN) 300 MG capsule, Take 300 mg by mouth every 6 (six) hours., Disp: , Rfl:    Cyanocobalamin (VITAMIN B 12 PO), Take 1,000 mcg by mouth daily., Disp: , Rfl:    docusate sodium (COLACE) 100 MG capsule, Take 100 mg by mouth daily., Disp: , Rfl:    Fexofenadine HCl (ALLEGRA PO), Take 180 mg by mouth daily in the afternoon., Disp: , Rfl:    fluticasone (FLONASE) 50 MCG/ACT nasal spray, Place 1 spray into both nostrils daily as needed for allergies or rhinitis., Disp: , Rfl:    folic acid (FOLVITE) 1 MG tablet, Take 1 mg by mouth 3 (three) times a week., Disp: , Rfl:    hydrochlorothiazide (HYDRODIURIL) 12.5 MG tablet, Take 1 tablet (12.5 mg total) by mouth daily., Disp: 90 tablet, Rfl: 3   hyoscyamine (ANASPAZ) 0.125 MG TBDP disintergrating tablet, DISSOLVE 1 TABLET ON THE TONGUE THREE TIMES DAILY BEFORE MEALS AS NEEDED, Disp: 270 tablet, Rfl: 1   methocarbamol (ROBAXIN) 500 MG tablet, Take 1 tablet (500 mg total) by mouth 3 (three) times daily., Disp: 40 tablet, Rfl: 1   ondansetron (ZOFRAN) 4 MG tablet, Take 1 tablet (4 mg total) by mouth every 8 (eight) hours as needed for vomiting, nausea or refractory nausea / vomiting., Disp: 30 tablet, Rfl: 1   oxyCODONE (ROXICODONE) 5 MG immediate release tablet, Take 1 tablet (5 mg total) by mouth every 4 (four) hours as needed for severe pain or breakthrough pain., Disp: 30 tablet, Rfl: 0   pantoprazole (PROTONIX) 40 MG tablet, TAKE 1 TABLET BY MOUTH DAILY AS  NEEDED FOR ACID REFLUX, Disp: 90 tablet, Rfl: 1   predniSONE (DELTASONE) 20 MG tablet, Take 20 mg by mouth 2 (two) times daily., Disp: , Rfl:    Probiotic Product (PROBIOTIC DAILY PO), Take 1 capsule by mouth daily in the afternoon., Disp: , Rfl:     psyllium (METAMUCIL) 58.6 % powder, Take 1 packet by mouth daily., Disp: , Rfl:    rosuvastatin (CRESTOR) 10 MG tablet, Take one tablet  4 times a week., Disp: 81 tablet, Rfl: 0   sacubitril-valsartan (ENTRESTO) 24-26 MG, TAKE 1  TABLET BY MOUTH TWICE  DAILY, Disp: 180 tablet, Rfl: 1   XARELTO 10 MG TABS tablet, TAKE 1 TABLET BY MOUTH DAILY, Disp: 90 tablet, Rfl: 3  Review of Systems:  Negative unless indicated in HPI.   Physical Exam: Vitals:   11/11/23 0826  BP: (!) 150/90  Pulse: 67  Temp: 98.4 F (36.9 C)  TempSrc: Oral  SpO2: 100%  Weight: 223 lb 11.2 oz (101.5 kg)  Height: 5\' 7"  (1.702 m)    Body mass index is 35.04 kg/m.   Physical Exam HENT:     Mouth/Throat:     Pharynx: Posterior oropharyngeal erythema present.     Tonsils: Tonsillar exudate present.      Impression and Plan:  Hospital discharge follow-up  Tonsillitis  Tonsil stone   -Complete antibiotic and prednisone courses as prescribed.  Since already over 50% improved I believe nothing further to be done.  If issues persist after antibiotics completed, will consider ENT referral.  Time spent:23 minutes reviewing chart, interviewing and examining patient and formulating plan of care.     Chaya Jan, MD [facility]

## 2023-11-25 ENCOUNTER — Telehealth: Payer: Self-pay | Admitting: Internal Medicine

## 2023-11-25 NOTE — Telephone Encounter (Signed)
Pt thinks she may have yeast from antibiotic, red splotch under arm, circular and mouth and throat burning and tingling. Seeking guidance on what to do

## 2023-11-25 NOTE — Telephone Encounter (Signed)
I spoke with the patient and she has been scheduled for 11/26/23 due to not having any available openings today

## 2023-11-26 ENCOUNTER — Encounter: Payer: Self-pay | Admitting: Family Medicine

## 2023-11-26 ENCOUNTER — Ambulatory Visit (INDEPENDENT_AMBULATORY_CARE_PROVIDER_SITE_OTHER): Payer: Medicare Other | Admitting: Family Medicine

## 2023-11-26 VITALS — BP 100/68 | HR 71 | Temp 98.9°F | Ht 67.0 in | Wt 228.0 lb

## 2023-11-26 DIAGNOSIS — L304 Erythema intertrigo: Secondary | ICD-10-CM

## 2023-11-26 DIAGNOSIS — J358 Other chronic diseases of tonsils and adenoids: Secondary | ICD-10-CM | POA: Diagnosis not present

## 2023-11-26 MED ORDER — NYSTATIN 100000 UNIT/GM EX POWD
1.0000 | Freq: Three times a day (TID) | CUTANEOUS | 0 refills | Status: DC
Start: 2023-11-26 — End: 2024-04-19

## 2023-11-26 NOTE — Patient Instructions (Signed)
Contact the clinic if your throat starts burning again.

## 2023-11-26 NOTE — Progress Notes (Signed)
Established Patient Office Visit   Subjective  Patient ID: Ann Long, female    DOB: [birth year]  Age: 72 y.o. MRN: [id number]  Chief Complaint  Patient presents with   Sore Throat    Started 4 days ago, is getting better   Rash    Under the right arm, started a week ago, red spot, some burning     Patient is a 72 year old female followed by Dr. Ardyth Harps and seen for acute concern.  Patient endorses burning sore throat x 4 days.  Back of throat stopped burning.  Having mild burning in back of tongue.  Was going to cancel appointment.  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  Burning in back of throat was new.  Initially concerned for thrush given recent antibiotic and prednisone use for tonsil stone/tonsillitis.  Patient also mentions a red circle in axilla x 1 week.  States area seem to fade some with use of Neosporin.  Patient notes history of right shoulder replacement in May 2024.  Decreased ROM and edema of right UE due to nerve damage from procedure.   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   Hyperlipidemia 04/06/2022   History of DVT (deep vein thrombosis) 03/24/2021   Hypertension 03/24/2021   Osteoporosis 03/24/2021   Chronic anticoagulation 03/24/2021   Irritable bowel syndrome 03/24/2021   Seasonal allergies 03/24/2021   GERD (gastroesophageal reflux disease) 03/24/2021   OA (osteoarthritis) of hip 09/20/2019   LBBB (left bundle branch block) 02/13/2016   Impaired left ventricular relaxation 02/13/2016   Past Medical History:  Diagnosis Date   Abnormal vaginal Pap smear    Arthritis    CHF (congestive heart failure) (HCC)    Coronary artery disease    Deep vein thrombosis (DVT) (HCC)    reports they were sporadic , right leg  to vena cava, then one in the left leg, then one in the left kidney --all were onsearate occasions     Dyspnea    Gallbladder disease    GERD (gastroesophageal reflux disease)    Heart murmur    Hepatitis    Hep A as child   Hypertension    Jaundice    as a child, her sister was diagnosed with it due to ingestion hepatitis, she later develped januce and thpugh was not testes herself , was presumed to also have the same time of hepatitis and was subsequesntly treated with globulin '   Left bundle branch block    "my pcp told me years ago i had it , 2 -3 years ago my obgyn heard a heart murmur and sent me to [cardiology] where they did a ultrasound of my heart , they didnt find anything definitive" , denies chest pain , sob, syncope    Osteoarthritis    Osteoporosis    Phlebitis    PONV (postoperative nausea and vomiting)    Past Surgical History:  Procedure Laterality Date   ABLATION  uterine   ?1995   CATARACT EXTRACTION W/ INTRAOCULAR LENS IMPLANT Bilateral    CHOLECYSTECTOMY  over 10 years ago   REPLACEMENT TOTAL HIP W/  RESURFACING IMPLANTS     REVERSE SHOULDER ARTHROPLASTY Right 05/10/2023   Procedure: REVERSE SHOULDER ARTHROPLASTY;  Surgeon: Beverely Low, MD;  Location: [facility];  Service: Orthopedics;  Laterality: Right;  interscalene block 110   TOTAL  HIP ARTHROPLASTY Left 2009   at     TOTAL HIP ARTHROPLASTY Right 09/20/2019   Procedure: TOTAL HIP ARTHROPLASTY ANTERIOR APPROACH;  Surgeon: Ollen Gross, MD;  Location: [facility];  Service: Orthopedics;  Laterality: Right;    TUBAL LIGATION  1984   Social History   Tobacco Use   Smoking status: Never   Smokeless tobacco: Never  Vaping Use   Vaping status: Never Used  Substance Use Topics   Alcohol use: No   Drug use: No   Family History  Problem Relation Age of Onset   Hypertension Mother    Stroke Mother 59   Osteoporosis Mother    Heart disease Mother        in chronic a fib   Heart attack Father 50       fatal   Stroke Father 73   Diabetes Father        ?not diagnosed   Heart attack  Sister    Diabetes Sister    Hypertension Sister    Kidney failure Sister    Atrial fibrillation Sister    Heart attack Sister 58   Heart disease Sister        enlarged heart   Hypertension Sister    Diabetes Sister    Heart attack Paternal Grandfather 47   Stroke Sister 32   Hypertension Sister    Atrial fibrillation Sister        multiple conversions   Diabetes Brother    Blindness Maternal Grandmother    Deafness Maternal Grandmother    Heart failure Maternal Grandmother 95   Hypertension Maternal Grandmother    Stomach cancer Maternal Grandfather    Hypertension Maternal Grandfather    Heart disease Paternal Uncle 13   Diabetes Paternal Uncle    Allergies  Allergen Reactions   Doxycycline Diarrhea   Sulfa Antibiotics Rash      Review of Systems  Skin:  Positive for rash.   Negative unless stated above    Objective:     BP 100/68 (BP Location: Left Arm, Patient Position: Sitting, Cuff Size: Large)   Pulse 71   Temp 98.9 F (37.2 C) (Oral)   Ht 5\' 7"  (1.702 m)   Wt 228 lb (103.4 kg)   SpO2 97%   BMI 35.71 kg/m  BP Readings from Last 3 Encounters:  11/26/23 100/68  11/11/23 (!) 150/90  08/25/23 118/72   Wt Readings from Last 3 Encounters:  11/26/23 228 lb (103.4 kg)  11/11/23 223 lb 11.2 oz (101.5 kg)  10/08/23 223 lb (101.2 kg)      Physical Exam Constitutional:      General: She is not in acute distress.    Appearance: Normal appearance. She is well-developed.  HENT:     Head: Normocephalic and atraumatic.     Nose: Nose normal.     Mouth/Throat:     Mouth: Mucous membranes are moist.     Tongue: No lesions.     Palate: No lesions.     Pharynx: Uvula midline. Posterior oropharyngeal erythema present.     Comments: Right tonsil stone.  Mild edema and erythema of tonsils noted.  No exudate. Cardiovascular:     Rate and Rhythm: Normal rate and regular rhythm.     Heart sounds: Normal heart sounds. No murmur heard.    No gallop.   Pulmonary:     Effort: Pulmonary effort is normal.     Breath sounds: Normal breath sounds.  Skin:  General: Skin is warm and dry.     Comments: Right axilla with large, circumscribed faint erythematous, flat lesion.  Small satellite lesions appreciated.  Neurological:     Mental Status: She is alert and oriented to person, place, and time.     Comments: Wearing compression glove on right hand      No results found for any visits on 11/26/23.    Assessment & Plan:  Intertrigo -In right axilla.  Likely due to decreased ROM of RUE causing increased moisture being trapped against skin. -Discussed moisture wicking fabrics, thoroughly drying after showers, moving arm throughout the day for ventilation. -Start nystatin powder. -     Nystatin; Apply 1 Application topically 3 (three) times daily.  Dispense: 15 g; Refill: 0  Tonsil stone -Gargling with warm salt water Chloraseptic spray -Continue to monitor   Return if symptoms worsen or fail to improve.   Deeann Saint, MD

## 2024-01-09 ENCOUNTER — Other Ambulatory Visit: Payer: Self-pay | Admitting: Internal Medicine

## 2024-01-24 ENCOUNTER — Ambulatory Visit (HOSPITAL_BASED_OUTPATIENT_CLINIC_OR_DEPARTMENT_OTHER): Payer: Self-pay

## 2024-01-25 ENCOUNTER — Ambulatory Visit (HOSPITAL_BASED_OUTPATIENT_CLINIC_OR_DEPARTMENT_OTHER)
Admission: RE | Admit: 2024-01-25 | Discharge: 2024-01-25 | Disposition: A | Discharge status: Home / Self Care | Payer: Medicare Other | Source: Ambulatory Visit | Attending: Internal Medicine | Admitting: Internal Medicine

## 2024-01-25 ENCOUNTER — Encounter (HOSPITAL_BASED_OUTPATIENT_CLINIC_OR_DEPARTMENT_OTHER): Payer: Self-pay

## 2024-01-25 VITALS — BP 116/79 | HR 65 | Temp 98.2°F | Resp 20

## 2024-01-25 DIAGNOSIS — J069 Acute upper respiratory infection, unspecified: Secondary | ICD-10-CM

## 2024-01-25 NOTE — Discharge Instructions (Addendum)
 Exam is consistent with a viral upper respiratory infection.  Lungs are clear.  No flu testing available today.  History indicates she could have influenza type A.  She is already improving.  No benefit to adding Tamiflu.  May use Delsym cough syrup, 1 teaspoon, every 6 hours if needed for cough.  Delsym is over-the-counter.  Follow-up if symptoms do not improve, worsen or new symptoms occur.

## 2024-01-25 NOTE — ED Provider Notes (Signed)
 [facility]    CSN: [id number] Arrival date & time: 01/25/24  1125      History   Chief Complaint Chief Complaint  Patient presents with   Cough   Fever    HPI Ann Long is a 73 y.o. female.   Pt c/o coughing, sore throat, chills, low grade fever started Sunday   Cough Associated symptoms: chills, fever and sore throat   Associated symptoms: no chest pain, no ear pain, no rash and no shortness of breath   Fever Associated symptoms: chills, cough and sore throat   Associated symptoms: no chest pain, no diarrhea, no dysuria, no ear pain, no nausea, no rash and no vomiting     Past Medical History:  Diagnosis Date   Abnormal vaginal Pap smear    Arthritis    CHF (congestive heart failure) (HCC)    Coronary artery disease    Deep vein thrombosis (DVT) (HCC)    reports they were sporadic , right leg  to vena cava, then one in the left leg, then one in the left kidney --all were onsearate occasions    Dyspnea    Gallbladder disease    GERD (gastroesophageal reflux disease)    Heart murmur    Hepatitis    Hep A as child   Hypertension    Jaundice    as a child, her sister was diagnosed with it due to ingestion hepatitis, she later develped januce and thpugh was not testes herself , was presumed to also have the same time of hepatitis and was subsequesntly treated with globulin '   Left bundle branch block    my pcp told me years ago i had it , 2 -3 years ago my obgyn heard a heart murmur and sent me to [cardiology] where they did a ultrasound of my heart , they didnt find anything definitive , denies chest pain , sob, syncope    Osteoarthritis    Osteoporosis    Phlebitis    PONV (postoperative nausea and vomiting)     Patient Active Problem List   Diagnosis Date Noted   S/P shoulder replacement, 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/2023   Hyperlipidemia 04/06/2022   History of DVT (deep vein thrombosis) 03/24/2021   Hypertension 03/24/2021   Osteoporosis 03/24/2021    Chronic anticoagulation 03/24/2021   Irritable bowel syndrome 03/24/2021   Seasonal allergies 03/24/2021   GERD (gastroesophageal reflux disease) 03/24/2021   OA (osteoarthritis) of hip 09/20/2019   LBBB (left bundle branch block) 02/13/2016   Impaired left ventricular relaxation 02/13/2016    Past Surgical History:  Procedure Laterality Date   ABLATION  uterine   ?1995   CATARACT EXTRACTION W/ INTRAOCULAR LENS IMPLANT Bilateral    CHOLECYSTECTOMY  over 10 years ago   REPLACEMENT TOTAL HIP W/  RESURFACING IMPLANTS     REVERSE SHOULDER ARTHROPLASTY Right 05/10/2023   Procedure: REVERSE SHOULDER ARTHROPLASTY;  Surgeon: Kay Kemps, MD;  Location: [facility];  Service: Orthopedics;  Laterality: Right;  interscalene block 110   TOTAL HIP ARTHROPLASTY Left 2009   at [place]    TOTAL HIP ARTHROPLASTY Right 09/20/2019   Procedure: TOTAL HIP ARTHROPLASTY ANTERIOR APPROACH;  Surgeon: Melodi Lerner, MD;  Location: [facility];  Service: Orthopedics;  Laterality: Right;    TUBAL LIGATION  1984    OB History   No obstetric history on file.      Home Medications    Prior to Admission medications   Medication Sig Start Date End  Date Taking? Authorizing Provider  carvedilol  (COREG ) 3.125 MG tablet TAKE 1 TABLET BY MOUTH TWICE  DAILY 09/07/23  Yes Croitoru, Mihai, MD  Cholecalciferol  (VITAMIN D ) 50 MCG (2000 UT) CAPS Take 2,000 Units by mouth daily.   Yes [provider]  hydrochlorothiazide  (HYDRODIURIL ) 12.5 MG tablet Take 1 tablet (12.5 mg total) by mouth daily. 04/19/23  Yes Croitoru, Mihai, MD  hyoscyamine  (ANASPAZ ) 0.125 MG TBDP disintergrating tablet DISSOLVE 1 TABLET ON THE TONGUE THREE TIMES DAILY BEFORE MEALS AS NEEDED 10/13/22  Yes Theophilus Andrews, Tully GRADE, MD  pantoprazole  (PROTONIX ) 40 MG tablet TAKE 1 TABLET BY MOUTH DAILY AS  NEEDED FOR ACID REFLUX 06/02/23  Yes Theophilus Andrews, Tully GRADE, MD  Probiotic Product (PROBIOTIC DAILY PO) Take 1 capsule by mouth daily in  the afternoon.   Yes [provider]  psyllium (METAMUCIL) 58.6 % powder Take 1 packet by mouth daily.   Yes [provider]  rosuvastatin  (CRESTOR ) 10 MG tablet TAKE 1 TABLET BY MOUTH 4 TIMES  WEEKLY 01/10/24  Yes Theophilus Andrews, Tully GRADE, MD  sacubitril -valsartan  (ENTRESTO ) 24-26 MG TAKE 1 TABLET BY MOUTH TWICE  DAILY 10/19/23  Yes Croitoru, Mihai, MD  XARELTO  10 MG TABS tablet TAKE 1 TABLET BY MOUTH DAILY 03/08/23  Yes Theophilus Andrews, Tully GRADE, MD  acetaminophen  (TYLENOL ) 500 MG tablet Take 500 mg by mouth every 6 (six) hours as needed for moderate pain.    [provider]  amoxicillin  (AMOXIL ) 500 MG capsule Take 2,000 mg by mouth See admin instructions. Take 2000 mg by mouth prior to dental appointments 11/17/15   [provider]  Cyanocobalamin  (VITAMIN B 12 PO) Take 1,000 mcg by mouth daily.    [provider]  docusate sodium  (COLACE) 100 MG capsule Take 100 mg by mouth daily.    [provider]  Fexofenadine HCl (ALLEGRA PO) Take 180 mg by mouth daily in the afternoon.    [provider]  fluticasone  (FLONASE ) 50 MCG/ACT nasal spray Place 1 spray into both nostrils daily as needed for allergies or rhinitis.    [provider]  folic acid  (FOLVITE ) 1 MG tablet Take 1 mg by mouth 3 (three) times a week.    [provider]  methocarbamol  (ROBAXIN ) 500 MG tablet Take 1 tablet (500 mg total) by mouth 3 (three) times daily. 05/10/23   Kay Kemps, MD  nystatin  (MYCOSTATIN /NYSTOP ) powder Apply 1 Application topically 3 (three) times daily. 11/26/23   Mercer Clotilda SAUNDERS, MD    Family History Family History  Problem Relation Age of Onset   Hypertension Mother    Stroke Mother 41   Osteoporosis Mother    Heart disease Mother        in chronic a fib   Heart attack Father 92       fatal   Stroke Father 54   Diabetes Father        ?not diagnosed   Heart attack Sister    Diabetes Sister    Hypertension Sister     Kidney failure Sister    Atrial fibrillation Sister    Heart attack Sister 66   Heart disease Sister        enlarged heart   Hypertension Sister    Diabetes Sister    Heart attack Paternal Grandfather 42   Stroke Sister 78   Hypertension Sister    Atrial fibrillation Sister        multiple conversions   Diabetes Brother    Blindness  Maternal Grandmother    Deafness Maternal Grandmother    Heart failure Maternal Grandmother 95   Hypertension Maternal Grandmother    Stomach cancer Maternal Grandfather    Hypertension Maternal Grandfather    Heart disease Paternal Uncle 71   Diabetes Paternal Uncle     Social History Social History   Tobacco Use   Smoking status: Never   Smokeless tobacco: Never  Vaping Use   Vaping status: Never Used  Substance Use Topics   Alcohol use: No   Drug use: No     Allergies   Doxycycline and Sulfa antibiotics   Review of Systems Review of Systems  Constitutional:  Positive for chills and fever.  HENT:  Positive for sore throat. Negative for ear pain.   Eyes:  Negative for pain and visual disturbance.  Respiratory:  Positive for cough. Negative for shortness of breath.   Cardiovascular:  Negative for chest pain and palpitations.  Gastrointestinal:  Negative for abdominal pain, constipation, diarrhea, nausea and vomiting.  Genitourinary:  Negative for dysuria and hematuria.  Musculoskeletal:  Negative for arthralgias and back pain.  Skin:  Negative for color change and rash.  Neurological:  Negative for seizures and syncope.  All other systems reviewed and are negative.    Physical Exam Triage Vital Signs ED Triage Vitals  Encounter Vitals Group     BP 01/25/24 1202 116/79     Systolic BP Percentile --      Diastolic BP Percentile --      Pulse Rate 01/25/24 1202 65     Resp 01/25/24 1202 20     Temp 01/25/24 1202 98.2 F (36.8 C)     Temp Source 01/25/24 1202 Oral     SpO2 01/25/24 1202 97 %     Weight --      Height  --      Head Circumference --      Peak Flow --      Pain Score 01/25/24 1200 0     Pain Loc --      Pain Education --      Exclude from Growth Chart --    No data found.  Updated Vital Signs BP 116/79 (BP Location: Right Arm)   Pulse 65   Temp 98.2 F (36.8 C) (Oral)   Resp 20   SpO2 97%   Visual Acuity Right Eye Distance:   Left Eye Distance:   Bilateral Distance:    Right Eye Near:   Left Eye Near:    Bilateral Near:     Physical Exam Vitals and nursing note reviewed.  Constitutional:      General: She is not in acute distress.    Appearance: She is well-developed. She is not ill-appearing or toxic-appearing.  HENT:     Head: Normocephalic and atraumatic.     Right Ear: Hearing, tympanic membrane, ear canal and external ear normal.     Left Ear: Hearing, tympanic membrane, ear canal and external ear normal.     Nose: Congestion and rhinorrhea present. Rhinorrhea is clear.     Right Sinus: No maxillary sinus tenderness or frontal sinus tenderness.     Left Sinus: No maxillary sinus tenderness or frontal sinus tenderness.     Mouth/Throat:     Lips: Pink.     Mouth: Mucous membranes are moist.     Pharynx: Uvula midline. No oropharyngeal exudate or posterior oropharyngeal erythema.     Tonsils: No tonsillar exudate.  Eyes:  Conjunctiva/sclera: Conjunctivae normal.     Pupils: Pupils are equal, round, and reactive to light.  Cardiovascular:     Rate and Rhythm: Normal rate and regular rhythm.     Heart sounds: S1 normal and S2 normal. No murmur heard. Pulmonary:     Effort: Pulmonary effort is normal. No respiratory distress.     Breath sounds: Normal breath sounds. No decreased breath sounds, wheezing, rhonchi or rales.  Abdominal:     Palpations: Abdomen is soft.     Tenderness: There is no abdominal tenderness.  Musculoskeletal:        General: No swelling.     Cervical back: Neck supple.  Lymphadenopathy:     Head:     Right side of head: No  submental, submandibular, tonsillar, preauricular or posterior auricular adenopathy.     Left side of head: No submental, submandibular, tonsillar, preauricular or posterior auricular adenopathy.     Cervical: No cervical adenopathy.     Right cervical: No superficial cervical adenopathy.    Left cervical: No superficial cervical adenopathy.  Skin:    General: Skin is warm and dry.     Capillary Refill: Capillary refill takes less than 2 seconds.     Findings: No rash.  Neurological:     Mental Status: She is alert and oriented to person, place, and time.  Psychiatric:        Mood and Affect: Mood normal.      UC Treatments / Results  Labs (all labs ordered are listed, but only abnormal results are displayed) Labs Reviewed - No data to display  EKG   Radiology No results found.  Procedures Procedures (including critical care time)  Medications Ordered in UC Medications - No data to display  Initial Impression / Assessment and Plan / UC Course  I have reviewed the triage vital signs and the nursing notes.  Pertinent labs & imaging results that were available during my care of the patient were reviewed by me and considered in my medical decision making (see chart for details).     Flu testing not available today.  History and exam findings are consistent with influenza type A.  But she is already improving.  No benefit to using Tamiflu.  Get plenty of fluids and rest.  May use Delsym, OTC cough syrup, every 6 hours if needed for cough.  Follow-up if symptoms do not improve, worsen or new symptoms occur. Final Clinical Impressions(s) / UC Diagnoses   Final diagnoses:  Viral URI with cough     Discharge Instructions      Exam is consistent with a viral upper respiratory infection.  Lungs are clear.  No flu testing available today.  History indicates she could have influenza type A.  She is already improving.  No benefit to adding Tamiflu.  May use Delsym cough syrup,  1 teaspoon, every 6 hours if needed for cough.  Delsym is over-the-counter.  Follow-up if symptoms do not improve, worsen or new symptoms occur.     ED Prescriptions   None    PDMP not reviewed this encounter.   Ival Domino, FNP 01/25/24 1228

## 2024-01-25 NOTE — ED Triage Notes (Signed)
Pt c/o coughing, sore throat, chills, low grade fever started Sunday

## 2024-02-22 ENCOUNTER — Encounter: Payer: Self-pay | Admitting: Internal Medicine

## 2024-02-22 ENCOUNTER — Ambulatory Visit (INDEPENDENT_AMBULATORY_CARE_PROVIDER_SITE_OTHER): Payer: Medicare Other | Admitting: Internal Medicine

## 2024-02-22 VITALS — BP 128/80 | HR 65 | Temp 97.6°F | Ht 66.0 in | Wt 226.4 lb

## 2024-02-22 DIAGNOSIS — Z0000 Encounter for general adult medical examination without abnormal findings: Secondary | ICD-10-CM | POA: Diagnosis not present

## 2024-02-22 DIAGNOSIS — Z6836 Body mass index (BMI) 36.0-36.9, adult: Secondary | ICD-10-CM | POA: Diagnosis not present

## 2024-02-22 DIAGNOSIS — I10 Essential (primary) hypertension: Secondary | ICD-10-CM | POA: Diagnosis not present

## 2024-02-22 DIAGNOSIS — M810 Age-related osteoporosis without current pathological fracture: Secondary | ICD-10-CM

## 2024-02-22 DIAGNOSIS — Z7901 Long term (current) use of anticoagulants: Secondary | ICD-10-CM | POA: Diagnosis not present

## 2024-02-22 DIAGNOSIS — E785 Hyperlipidemia, unspecified: Secondary | ICD-10-CM

## 2024-02-22 DIAGNOSIS — Z86718 Personal history of other venous thrombosis and embolism: Secondary | ICD-10-CM

## 2024-02-22 DIAGNOSIS — I5020 Unspecified systolic (congestive) heart failure: Secondary | ICD-10-CM

## 2024-02-22 LAB — LIPID PANEL
Cholesterol: 115 mg/dL (ref 0–200)
HDL: 46.6 mg/dL (ref >39.00)
LDL Cholesterol: 49 mg/dL (ref 0–99)
NonHDL: 68.42
Total CHOL/HDL Ratio: 2
Triglycerides: 95 mg/dL (ref 0.0–149.0)
VLDL: 19 mg/dL (ref 0.0–40.0)

## 2024-02-22 LAB — COMPREHENSIVE METABOLIC PANEL
ALT: 11 U/L (ref 0–35)
AST: 17 U/L (ref 0–37)
Albumin: 4.2 g/dL (ref 3.5–5.2)
Alkaline Phosphatase: 45 U/L (ref 39–117)
BUN: 18 mg/dL (ref 6–23)
CO2: 29 meq/L (ref 19–32)
Calcium: 9.7 mg/dL (ref 8.4–10.5)
Chloride: 105 meq/L (ref 96–112)
Creatinine, Ser: 0.92 mg/dL (ref 0.40–1.20)
GFR: 62.31 mL/min (ref >60.00)
Glucose, Bld: 91 mg/dL (ref 70–99)
Potassium: 4.7 meq/L (ref 3.5–5.1)
Sodium: 141 meq/L (ref 135–145)
Total Bilirubin: 0.7 mg/dL (ref 0.2–1.2)
Total Protein: 7 g/dL (ref 6.0–8.3)

## 2024-02-22 LAB — HEMOGLOBIN A1C: Hgb A1c MFr Bld: 5.6 % (ref 4.6–6.5)

## 2024-02-22 LAB — CBC WITH DIFFERENTIAL/PLATELET
Basophils Absolute: 0 10*3/uL (ref 0.0–0.1)
Basophils Relative: 0.4 % (ref 0.0–3.0)
Eosinophils Absolute: 0.1 10*3/uL (ref 0.0–0.7)
Eosinophils Relative: 2 % (ref 0.0–5.0)
HCT: 38.5 % (ref 36.0–46.0)
Hemoglobin: 12.9 g/dL (ref 12.0–15.0)
Lymphocytes Relative: 29.2 % (ref 12.0–46.0)
Lymphs Abs: 1.6 10*3/uL (ref 0.7–4.0)
MCHC: 33.5 g/dL (ref 30.0–36.0)
MCV: 95.8 fl (ref 78.0–100.0)
Monocytes Absolute: 0.4 10*3/uL (ref 0.1–1.0)
Monocytes Relative: 6.9 % (ref 3.0–12.0)
Neutro Abs: 3.3 10*3/uL (ref 1.4–7.7)
Neutrophils Relative %: 61.5 % (ref 43.0–77.0)
Platelets: 283 10*3/uL (ref 150.0–400.0)
RBC: 4.02 Mil/uL (ref 3.87–5.11)
RDW: 12.6 % (ref 11.5–15.5)
WBC: 5.4 10*3/uL (ref 4.0–10.5)

## 2024-02-22 LAB — VITAMIN B12: Vitamin B-12: 543 pg/mL (ref 211–911)

## 2024-02-22 LAB — TSH: TSH: 2.75 u[IU]/mL (ref 0.35–5.50)

## 2024-02-22 LAB — VITAMIN D 25 HYDROXY (VIT D DEFICIENCY, FRACTURES): VITD: 48.92 ng/mL (ref 30.00–100.00)

## 2024-02-22 NOTE — Progress Notes (Signed)
 Established Patient Office Visit     CC/Reason for Visit: Annual preventive exam  HPI: Ann Long is a 73 y.o. female who is coming in today for the above mentioned reasons. Past Medical History is significant for: Hypertension, hyperlipidemia, GERD, osteoporosis, DVT on chronic anticoagulation, vitamin B12 deficiency and heart failure with reduced ejection fraction.  Feeling well without concerns or complaints, has routine eye and dental care, all immunizations are up-to-date.   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 , right leg  to vena cava, then one in the left leg, then one in the left kidney --all were onsearate occasions    Dyspnea    Gallbladder disease    GERD (gastroesophageal reflux disease)    Heart murmur    Hepatitis    Hep A as child   Hypertension    Jaundice    as a child, her sister was diagnosed with it due to ingestion hepatitis, she later develped januce and thpugh was not testes herself , was presumed to also have the same time of hepatitis and was subsequesntly treated with globulin '   Left bundle branch block    "my pcp told me years ago i had it , 2 -3 years ago my obgyn heard a heart murmur and sent me to [cardiology] where they did a ultrasound of my heart , they didnt find anything definitive" , denies chest pain , sob, syncope    Osteoarthritis    Osteoporosis    Phlebitis    PONV (postoperative nausea and vomiting)     Past Surgical History:  Procedure Laterality Date   ABLATION  uterine   ?1995   CATARACT EXTRACTION W/ INTRAOCULAR LENS IMPLANT Bilateral    CHOLECYSTECTOMY  over 10 years ago   REPLACEMENT TOTAL HIP W/  RESURFACING IMPLANTS     REVERSE SHOULDER ARTHROPLASTY Right 05/10/2023   Procedure: REVERSE SHOULDER  ARTHROPLASTY;  Surgeon: Beverely Low, MD;  Location: [facility];  Service: Orthopedics;  Laterality: Right;  interscalene block 110   TOTAL HIP ARTHROPLASTY Left 2009   at [place]    TOTAL HIP ARTHROPLASTY Right 09/20/2019   Procedure: TOTAL HIP ARTHROPLASTY ANTERIOR APPROACH;  Surgeon: Ollen Gross, MD;  Location: [facility];  Service: Orthopedics;  Laterality: Right;    TUBAL LIGATION  1984    Social History:  reports that she has never smoked. She has never used smokeless tobacco. She reports that she does not drink alcohol and does not use drugs.  Allergies: Allergies  Allergen Reactions   Doxycycline Diarrhea   Sulfa Antibiotics Rash    Family History:  Family History  Problem Relation Age of Onset   Hypertension Mother    Stroke Mother 16   Osteoporosis Mother    Heart disease Mother        in chronic a fib   Heart attack Father 8       fatal   Stroke Father 62   Diabetes Father        ?not diagnosed   Heart attack Sister    Diabetes Sister    Hypertension Sister    Kidney failure Sister    Atrial fibrillation Sister    Heart attack Sister 42   Heart  disease Sister        enlarged heart   Hypertension Sister    Diabetes Sister    Heart attack Paternal Grandfather 80   Stroke Sister 61   Hypertension Sister    Atrial fibrillation Sister        multiple conversions   Diabetes Brother    Blindness Maternal Grandmother    Deafness Maternal Grandmother    Heart failure Maternal Grandmother 95   Hypertension Maternal Grandmother    Stomach cancer Maternal Grandfather    Hypertension Maternal Grandfather    Heart disease Paternal Uncle 24   Diabetes Paternal Uncle      Current Outpatient Medications:    Ascorbic Acid (VITAMIN C) 1000 MG tablet, Take 1,000 mg by mouth daily., Disp: , Rfl:    acetaminophen (TYLENOL) 500 MG tablet, Take 500 mg by mouth every 6 (six) hours as needed for moderate pain., Disp: , Rfl:    amoxicillin (AMOXIL) 500 MG capsule,  Take 2,000 mg by mouth See admin instructions. Take 2000 mg by mouth prior to dental appointments, Disp: , Rfl:    carvedilol (COREG) 3.125 MG tablet, TAKE 1 TABLET BY MOUTH TWICE  DAILY, Disp: 180 tablet, Rfl: 3   Cholecalciferol (VITAMIN D) 50 MCG (2000 UT) CAPS, Take 2,000 Units by mouth daily., Disp: , Rfl:    Cyanocobalamin (VITAMIN B 12 PO), Take 1,000 mcg by mouth daily., Disp: , Rfl:    docusate sodium (COLACE) 100 MG capsule, Take 100 mg by mouth daily., Disp: , Rfl:    Fexofenadine HCl (ALLEGRA PO), Take 180 mg by mouth daily in the afternoon., Disp: , Rfl:    fluticasone (FLONASE) 50 MCG/ACT nasal spray, Place 1 spray into both nostrils daily as needed for allergies or rhinitis., Disp: , Rfl:    folic acid (FOLVITE) 1 MG tablet, Take 1 mg by mouth 3 (three) times a week., Disp: , Rfl:    hydrochlorothiazide (HYDRODIURIL) 12.5 MG tablet, Take 1 tablet (12.5 mg total) by mouth daily., Disp: 90 tablet, Rfl: 3   hyoscyamine (ANASPAZ) 0.125 MG TBDP disintergrating tablet, DISSOLVE 1 TABLET ON THE TONGUE THREE TIMES DAILY BEFORE MEALS AS NEEDED, Disp: 270 tablet, Rfl: 1   methocarbamol (ROBAXIN) 500 MG tablet, Take 1 tablet (500 mg total) by mouth 3 (three) times daily., Disp: 40 tablet, Rfl: 1   nystatin (MYCOSTATIN/NYSTOP) powder, Apply 1 Application topically 3 (three) times daily., Disp: 15 g, Rfl: 0   pantoprazole (PROTONIX) 40 MG tablet, TAKE 1 TABLET BY MOUTH DAILY AS  NEEDED FOR ACID REFLUX, Disp: 90 tablet, Rfl: 1   Probiotic Product (PROBIOTIC DAILY PO), Take 1 capsule by mouth daily in the afternoon., Disp: , Rfl:    psyllium (METAMUCIL) 58.6 % powder, Take 1 packet by mouth daily., Disp: , Rfl:    pyridOXINE (VITAMIN B6) 100 MG tablet, , Disp: , Rfl:    rosuvastatin (CRESTOR) 10 MG tablet, TAKE 1 TABLET BY MOUTH 4 TIMES  WEEKLY, Disp: 52 tablet, Rfl: 3   sacubitril-valsartan (ENTRESTO) 24-26 MG, TAKE 1 TABLET BY MOUTH TWICE  DAILY, Disp: 180 tablet, Rfl: 1   XARELTO 10 MG TABS  tablet, TAKE 1 TABLET BY MOUTH DAILY, Disp: 90 tablet, Rfl: 3  Review of Systems:  Negative unless indicated in HPI.   Physical Exam: Vitals:   02/22/24 0853  BP: 128/80  Pulse: 65  Temp: 97.6 F (36.4 C)  TempSrc: Oral  SpO2: 99%  Weight: 226 lb 6.4 oz (102.7 kg)  Height:  5\' 6"  (1.676 m)    Body mass index is 36.54 kg/m.   Physical Exam Vitals reviewed.  Constitutional:      General: She is not in acute distress.    Appearance: Normal appearance. She is obese. She is not ill-appearing, toxic-appearing or diaphoretic.  HENT:     Head: Normocephalic.     Right Ear: Tympanic membrane, ear canal and external ear normal. There is no impacted cerumen.     Left Ear: Tympanic membrane, ear canal and external ear normal. There is no impacted cerumen.     Nose: Nose normal.     Mouth/Throat:     Mouth: Mucous membranes are moist.     Pharynx: Oropharynx is clear. No oropharyngeal exudate or posterior oropharyngeal erythema.  Eyes:     General: No scleral icterus.       Right eye: No discharge.        Left eye: No discharge.     Conjunctiva/sclera: Conjunctivae normal.     Pupils: Pupils are equal, round, and reactive to light.  Neck:     Vascular: No carotid bruit.  Cardiovascular:     Rate and Rhythm: Normal rate and regular rhythm.     Pulses: Normal pulses.     Heart sounds: Normal heart sounds.  Pulmonary:     Effort: Pulmonary effort is normal. No respiratory distress.     Breath sounds: Normal breath sounds.  Abdominal:     General: Abdomen is flat. Bowel sounds are normal.     Palpations: Abdomen is soft.  Musculoskeletal:        General: Normal range of motion.     Cervical back: Normal range of motion.  Skin:    General: Skin is warm and dry.  Neurological:     General: No focal deficit present.     Mental Status: She is alert and oriented to person, place, and time. Mental status is at baseline.  Psychiatric:        Mood and Affect: Mood normal.         Behavior: Behavior normal.        Thought Content: Thought content normal.        Judgment: Judgment normal.      Impression and Plan:  Encounter for preventive health examination  History of DVT (deep vein thrombosis)  Chronic anticoagulation  Hypertension, unspecified type -     CBC with Differential/Platelet; Future -     Comprehensive metabolic panel; Future  Hyperlipidemia, unspecified hyperlipidemia type -     Lipid panel; Future  Osteoporosis, unspecified osteoporosis type, unspecified pathological fracture presence  HFrEF (heart failure with reduced ejection fraction) (HCC)  Morbid obesity (HCC) -     Hemoglobin A1c; Future -     TSH; Future -     Vitamin B12; Future -     VITAMIN D 25 Hydroxy (Vit-D Deficiency, Fractures); Future   -Recommend routine eye and dental care. -Healthy lifestyle discussed in detail. -Labs to be updated today. -Prostate cancer screening: N/A Health Maintenance  Topic Date Due   DEXA scan (bone density measurement)  Never done   Mammogram  08/07/2022   COVID-19 Vaccine (6 - 2024-25 season) 02/25/2024   Medicare Annual Wellness Visit  10/07/2024   DTaP/Tdap/Td vaccine (2 - Tdap) 07/20/2028   Colon Cancer Screening  03/24/2030   Pneumonia Vaccine  Completed   Flu Shot  Completed   Hepatitis C Screening  Completed   Zoster (Shingles) Vaccine  Completed  HPV Vaccine  Aged Out     -All immunizations are up-to-date. -Obtain records from GYN in regards to mammo, Pap smear and DEXA scan's. -Colon cancer screening is up-to-date.    Chaya Jan, MD [facility]

## 2024-02-24 ENCOUNTER — Encounter: Payer: Self-pay | Admitting: Internal Medicine

## 2024-03-23 ENCOUNTER — Other Ambulatory Visit: Payer: Self-pay | Admitting: Internal Medicine

## 2024-04-10 ENCOUNTER — Ambulatory Visit (HOSPITAL_COMMUNITY): Payer: Medicare Other | Attending: Internal Medicine

## 2024-04-10 DIAGNOSIS — I5042 Chronic combined systolic (congestive) and diastolic (congestive) heart failure: Secondary | ICD-10-CM

## 2024-04-10 LAB — ECHOCARDIOGRAM COMPLETE
Area-P 1/2: 2.66 cm2
Est EF: 50
S' Lateral: 3.4 cm

## 2024-04-11 ENCOUNTER — Encounter: Payer: Self-pay | Admitting: Cardiovascular Disease

## 2024-04-19 ENCOUNTER — Other Ambulatory Visit: Payer: Self-pay | Admitting: Internal Medicine

## 2024-04-19 ENCOUNTER — Other Ambulatory Visit: Payer: Self-pay | Admitting: Cardiovascular Disease

## 2024-04-19 ENCOUNTER — Ambulatory Visit: Payer: Medicare Other | Attending: Cardiovascular Disease | Admitting: Cardiovascular Disease

## 2024-04-19 VITALS — BP 102/70 | HR 59 | Ht 67.0 in | Wt 229.0 lb

## 2024-04-19 DIAGNOSIS — I25118 Atherosclerotic heart disease of native coronary artery with other forms of angina pectoris: Secondary | ICD-10-CM | POA: Diagnosis not present

## 2024-04-19 DIAGNOSIS — I428 Other cardiomyopathies: Secondary | ICD-10-CM | POA: Diagnosis not present

## 2024-04-19 DIAGNOSIS — E7800 Pure hypercholesterolemia, unspecified: Secondary | ICD-10-CM

## 2024-04-19 DIAGNOSIS — I5042 Chronic combined systolic (congestive) and diastolic (congestive) heart failure: Secondary | ICD-10-CM | POA: Diagnosis not present

## 2024-04-19 DIAGNOSIS — I447 Left bundle-branch block, unspecified: Secondary | ICD-10-CM | POA: Diagnosis not present

## 2024-04-19 DIAGNOSIS — Z86718 Personal history of other venous thrombosis and embolism: Secondary | ICD-10-CM

## 2024-04-19 NOTE — Progress Notes (Signed)
 Cardiology Office Note:    Date:  04/19/2024   ID:  Ann Long, DOB 07-09-1951, MRN [id number]  PCP:  Zilphia Hilt, Charyl Coppersmith, MD   [facility] Providers Cardiologist:  Luana Rumple, MD     Referring MD: Zilphia Hilt, Estel*   Chief Complaint  Patient presents with   Congestive Heart Failure    History of Present Illness:    Ann Long is a 73 y.o. 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  The patient specifically denies any chest pain at rest or with exertion, dyspnea at rest or with exertion, orthopnea, paroxysmal nocturnal dyspnea, syncope, palpitations, focal neurological deficits, intermittent claudication, lower extremity edema, unexplained weight gain, cough, hemoptysis or wheezing.  She believes she has had an LBBB for at least 20 years.  I can find documentation on ECGs going back as far as 2017.  LVEF was estimated to be as low as 35-40% on previous echo, although this was probably an underestimation of true EF.  After initiation of medical therapy EF has improved back to 50% according to most recent echo performed just a week before this appointment.  She is on maximum tolerated doses of carvedilol  and Entresto  with doses limited by bradycardia and low blood pressure.  She is not on spironolactone due to low blood pressure.  Attempted treatment with Farxiga  was stopped due to worsening renal parameters.  Coronary CT angiogram performed 11/19/2022 showed only mild single-vessel CAD, severe ostial right coronary artery stenosis with noncalcified plaque and FFR 0.77, calcium  score of only 34 (56%ile).  It was felt that this was unlikely to explain her cardiomyopathy.  She has a murmur due to aortic valve sclerosis, but there is no evidence of aortic stenosis.  On statin therapy she has an excellent LDL cholesterol of 49 and an improved HDL at  46.6.  She has normal triglycerides and does not have diabetes.  She is not a smoker.  She is on chronic Xarelto  preventive dose of 10 mg daily since she has had several episodes of DVT, including 1 that appear to be unprovoked.  She has not had any falls, injuries or bleeding problems.  Her dad died of a massive heart attack at age 90 and her paternal uncle died at age 82 and there is a strong history of diabetes mellitus on that side of the family.  She has a brother with diabetes mellitus who has had a stent in his 77s.  She has 2 sisters with atrial fibrillation.  Past Medical History:  Diagnosis Date   Abnormal vaginal Pap smear    Arthritis    CHF (congestive heart failure) (HCC)    Coronary artery disease    Deep vein thrombosis (DVT) (HCC)    reports they were sporadic , right leg  to vena cava, then one in the left leg, then one in the left kidney --all were onsearate occasions    Dyspnea    Gallbladder disease    GERD (gastroesophageal reflux disease)    Heart murmur    Hepatitis    Hep A as child   Hypertension    Jaundice    as a child, her sister was diagnosed with it due to ingestion hepatitis, she later develped januce and thpugh was not testes herself , was presumed to also have the same time of hepatitis and was subsequesntly treated with globulin '   Left bundle branch  block    "my pcp told me years ago i had it , 2 -3 years ago my obgyn heard a heart murmur and sent me to [cardiology] where they did a ultrasound of my heart , they didnt find anything definitive" , denies chest pain , sob, syncope    Osteoarthritis    Osteoporosis    Phlebitis    PONV (postoperative nausea and vomiting)     Past Surgical History:  Procedure Laterality Date   ABLATION  uterine   ?1995   CATARACT EXTRACTION W/ INTRAOCULAR LENS IMPLANT Bilateral    CHOLECYSTECTOMY  over 10 years ago   REPLACEMENT TOTAL HIP W/  RESURFACING IMPLANTS     REVERSE SHOULDER ARTHROPLASTY Right 05/10/2023    Procedure: REVERSE SHOULDER ARTHROPLASTY;  Surgeon: Winston Hawking, MD;  Location: [facility];  Service: Orthopedics;  Laterality: Right;  interscalene block 110   TOTAL HIP ARTHROPLASTY Left 2009   at [place]    TOTAL HIP ARTHROPLASTY Right 09/20/2019   Procedure: TOTAL HIP ARTHROPLASTY ANTERIOR APPROACH;  Surgeon: Liliane Rei, MD;  Location: [facility];  Service: Orthopedics;  Laterality: Right;    TUBAL LIGATION  1984    Current Medications: Current Meds  Medication Sig   acetaminophen  (TYLENOL ) 500 MG tablet Take 500 mg by mouth every 6 (six) hours as needed for moderate pain.   Ascorbic Acid (VITAMIN C) 1000 MG tablet Take 1,000 mg by mouth daily.   carvedilol  (COREG ) 3.125 MG tablet TAKE 1 TABLET BY MOUTH TWICE  DAILY   Cholecalciferol  (VITAMIN D ) 50 MCG (2000 UT) CAPS Take 2,000 Units by mouth daily.   Cyanocobalamin  (VITAMIN B 12 PO) Take 1,000 mcg by mouth every other day.   docusate sodium  (COLACE) 100 MG capsule Take 100 mg by mouth daily.   Fexofenadine HCl (ALLEGRA PO) Take 180 mg by mouth daily in the afternoon.   fluticasone  (FLONASE ) 50 MCG/ACT nasal spray Place 1 spray into both nostrils daily as needed for allergies or rhinitis.   folic acid  (FOLVITE ) 1 MG tablet Take 1 mg by mouth 3 (three) times a week.   hydrochlorothiazide  (HYDRODIURIL ) 12.5 MG tablet Take 1 tablet (12.5 mg total) by mouth daily.   hyoscyamine  (ANASPAZ ) 0.125 MG TBDP disintergrating tablet DISSOLVE 1 TABLET ON THE TONGUE THREE TIMES DAILY BEFORE MEALS AS NEEDED   pantoprazole  (PROTONIX ) 40 MG tablet TAKE 1 TABLET BY MOUTH DAILY AS  NEEDED FOR ACID REFLUX   Probiotic Product (PROBIOTIC DAILY PO) Take 1 capsule by mouth daily in the afternoon.   pyridOXINE (VITAMIN B6) 100 MG tablet 100 mg 2 (two) times daily.   rosuvastatin  (CRESTOR ) 10 MG tablet TAKE 1 TABLET BY MOUTH 4 TIMES  WEEKLY   sacubitril -valsartan  (ENTRESTO ) 24-26 MG TAKE 1 TABLET BY MOUTH TWICE  DAILY   XARELTO  10 MG TABS tablet TAKE  1 TABLET BY MOUTH DAILY     Allergies:   Doxycycline and Sulfa antibiotics  Family History: The patient's family history includes Atrial fibrillation in her sister and sister; Blindness in her maternal grandmother; Deafness in her maternal grandmother; Diabetes in her brother, father, paternal uncle, sister, and sister; Heart attack in her sister; Heart attack (age of onset: 65) in her paternal grandfather; Heart attack (age of onset: 58) in her father; Heart attack (age of onset: 52) in her sister; Heart disease in her mother and sister; Heart disease (age of onset: 37) in her paternal uncle; Heart failure (age of onset: 100) in her maternal grandmother; Hypertension  in her maternal grandfather, maternal grandmother, mother, sister, sister, and sister; Kidney failure in her sister; Osteoporosis in her mother; Stomach cancer in her maternal grandfather; Stroke (age of onset: 70) in her father; Stroke (age of onset: 83) in her sister; Stroke (age of onset: 49) in her mother.   EKGs/Labs/Other Studies Reviewed:    The following studies were reviewed today:   Coronary CTA11/30/2023: 1. Coronary artery calcium  score 34.3 Agatston units. This places the patient in the 56th percentile for age and gender, suggesting intermediate risk for future cardiac events.   2. Visually severe ostial RCA stenosis with noncalcified plaque. FFR 0.77 in the mid RCA suggests borderline hemodynamic significance of the ostial RCA stenosis.  04/10/2024 ECHO   1. Septal motion consistent with conduction delay.. Left ventricular  ejection fraction, by estimation, is 50%. The left ventricle has low  normal function.   2. Right ventricular systolic function is low normal. The right  ventricular size is normal. There is normal pulmonary artery systolic  pressure.   3. Mild mitral valve regurgitation.   4. The aortic valve is tricuspid. Aortic valve regurgitation is not  visualized. Aortic valve sclerosis/calcification  is present, without any  evidence of aortic stenosis.   5. The inferior vena cava is normal in size with greater than 50%  respiratory variability, suggesting right atrial pressure of 3 mmHg.    EKG Interpretation Date/Time:  Wednesday April 19 2024 08:56:31 EDT Ventricular Rate:  59 PR Interval:  162 QRS Duration:  142 QT Interval:  452 QTC Calculation: 447 R Axis:   -29  Text Interpretation: Sinus bradycardia Left bundle branch block When compared with ECG of 14-Sep-2019 10:21, No significant change was found Confirmed by Curlie Sittner [phone]) on 04/19/2024 8:59:18 AM        Recent Labs: 02/22/2024: ALT 11; BUN 18; Creatinine, Ser 0.92; Hemoglobin 12.9; Platelets 283.0; Potassium 4.7; Sodium 141; TSH 2.75  Recent Lipid Panel    Component Value Date/Time   CHOL 115 02/22/2024 0935   CHOL 136 04/19/2023 0922   TRIG 95.0 02/22/2024 0935   HDL 46.60 02/22/2024 0935   HDL 42 04/19/2023 0922   CHOLHDL 2 02/22/2024 0935   VLDL 19.0 02/22/2024 0935   LDLCALC 49 02/22/2024 0935   LDLCALC 66 04/19/2023 0922   LDLDIRECT 110.0 03/24/2021 1101     Risk Assessment/Calculations:          Physical Exam:    VS:  BP 102/70 (BP Location: Left Arm, Patient Position: Sitting, Cuff Size: Large)   Pulse (!) 59   Ht 5\' 7"  (1.702 m)   Wt 103.9 kg   SpO2 99%   BMI 35.87 kg/m     Wt Readings from Last 3 Encounters:  04/19/24 103.9 kg  02/22/24 102.7 kg  11/26/23 103.4 kg      General: Alert, oriented x3, no distress, moderately obese Head: no evidence of trauma, PERRL, EOMI, no exophtalmos or lid lag, no myxedema, no xanthelasma; normal ears, nose and oropharynx Neck: normal jugular venous pulsations and no hepatojugular reflux; brisk carotid pulses without delay and no carotid bruits Chest: clear to auscultation, no signs of consolidation by percussion or palpation, normal fremitus, symmetrical and full respiratory excursions Cardiovascular: normal position and quality of the  apical impulse, regular rhythm, normal first and paradoxically split second heart sounds, 2/6 early peaking aortic ejection murmur, no diastolic murmurs, rubs or gallops Abdomen: no tenderness or distention, no masses by palpation, no abnormal pulsatility or arterial bruits, normal  bowel sounds, no hepatosplenomegaly Extremities: no clubbing, cyanosis or edema; 2+ radial, ulnar and brachial pulses bilaterally; 2+ right femoral, posterior tibial and dorsalis pedis pulses; 2+ left femoral, posterior tibial and dorsalis pedis pulses; no subclavian or femoral bruits Neurological: grossly nonfocal Psych: Normal mood and affect    ASSESSMENT:    1. Coronary artery disease of native artery of native heart with stable angina pectoris (HCC)   2. Nonischemic cardiomyopathy (HCC)   3. Chronic combined systolic (congestive) and diastolic (congestive) heart failure (HCC)   4. LBBB (left bundle branch block)   5. History of DVT (deep vein thrombosis)   6. Hypercholesterolemia   7. Severe obesity (BMI 35.0-39.9) with comorbidity (HCC)     PLAN:    In order of problems listed above:  CAD: Remains asymptomatic.  Single vessel borderline ostial RCA stenosis, without angina pectoris.  At this point do not recommend revascularization, but this is an option if she should develop angina.  On chronic Xarelto  anticoagulation, not on aspirin. CHF: NYHA functional class I, clinically euvolemic without loop diuretics.  On maximum doses of guideline directed medical therapy (not on spironolactone due to low blood pressure, did not tolerate Farxiga  due to worsening renal function).  She has primarily nonischemic cardiomyopathy.   Part of the reduction in EF is due to LBBB related dyssynchrony.   LBBB: She believes this has been present for at least 20 years, we can documented at least back to 2017.  Longstanding conduction abnormality.  Typical LBBB with QRS greater than 150 ms.  Future option for CRT-P if her symptoms  should worsen and the EF drops to less than 35%. Recurrent DVT: No problems on Xarelto .  There appears to be a family history of clotting disorders.  She states that she had a hematologist evaluate her about 20 years ago and there were no identifiable causes for hypercoagulability.  Has had at least 4 independent episodes of DVT of which only 1 was associated with an orthopedic procedure (total hip replacement 2021), others were spontaneous.  Plan lifelong anticoagulation.  She reports a previous hypercoagulable work-up by hematologist was unrevealing. HLP: All lipid parameters are excellent.  Continue current statin. Obesity: Continues to struggle with attempts at weight loss.  Weight loss would be beneficial for many points of view and would also likely improve her symptoms of shortness of breath.  Recurrent DVT.  Borderline hypertriglyceridemia borderline low HDL.  She does not have daytime hypersomnolence.  She does not have diabetes mellitus.             Medication Adjustments/Labs and Tests Ordered: Current medicines are reviewed at length with the patient today.  Concerns regarding medicines are outlined above.  Orders Placed This Encounter  Procedures   EKG 12-Lead   No orders of the defined types were placed in this encounter.   Patient Instructions  Medication Instructions:  No changes *If you need a refill on your cardiac medications before your next appointment, please call your pharmacy*  Follow-Up: At [facility] you and your health needs are our priority.  As part of our continuing mission to provide you with exceptional heart care, our providers are all part of one team.  This team includes your primary Cardiologist (physician) and Advanced Practice Providers or APPs (Physician Assistants and Nurse Practitioners) who all work together to provide you with the care you need, when you need it.  Your next appointment:   1 year(s)  Provider:   Luana Rumple, MD  We recommend signing up for the patient portal called "MyChart".  Sign up information is provided on this After Visit Summary.  MyChart is used to connect with patients for Virtual Visits (Telemedicine).  Patients are able to view lab/test results, encounter notes, upcoming appointments, etc.  Non-urgent messages can be sent to your provider as well.   To learn more about what you can do with MyChart, go to [email or web address].         Signed, Luana Rumple, MD  04/19/2024 9:32 AM    [facility]

## 2024-04-19 NOTE — Patient Instructions (Signed)
 Medication Instructions:  No changes *If you need a refill on your cardiac medications before your next appointment, please call your pharmacy*  Follow-Up: At [facility], you and your health needs are our priority.  As part of our continuing mission to provide you with exceptional heart care, our providers are all part of one team.  This team includes your primary Cardiologist (physician) and Advanced Practice Providers or APPs (Physician Assistants and Nurse Practitioners) who all work together to provide you with the care you need, when you need it.  Your next appointment:   1 year(s)  Provider:   Luana Rumple, MD    We recommend signing up for the patient portal called "MyChart".  Sign up information is provided on this After Visit Summary.  MyChart is used to connect with patients for Virtual Visits (Telemedicine).  Patients are able to view lab/test results, encounter notes, upcoming appointments, etc.  Non-urgent messages can be sent to your provider as well.   To learn more about what you can do with MyChart, go to [email or web address].

## 2024-08-24 ENCOUNTER — Ambulatory Visit: Admitting: Internal Medicine

## 2024-09-04 ENCOUNTER — Ambulatory Visit: Admitting: Internal Medicine

## 2024-09-12 ENCOUNTER — Other Ambulatory Visit: Payer: Self-pay | Admitting: Cardiovascular Disease

## 2024-09-12 ENCOUNTER — Encounter: Payer: Self-pay | Admitting: Internal Medicine

## 2024-09-12 ENCOUNTER — Ambulatory Visit: Admitting: Internal Medicine

## 2024-09-12 VITALS — BP 108/68 | HR 62 | Temp 97.5°F | Resp 18 | Wt 230.1 lb

## 2024-09-12 DIAGNOSIS — Z86718 Personal history of other venous thrombosis and embolism: Secondary | ICD-10-CM | POA: Diagnosis not present

## 2024-09-12 DIAGNOSIS — Z7901 Long term (current) use of anticoagulants: Secondary | ICD-10-CM | POA: Diagnosis not present

## 2024-09-12 DIAGNOSIS — I872 Venous insufficiency (chronic) (peripheral): Secondary | ICD-10-CM

## 2024-09-12 DIAGNOSIS — E785 Hyperlipidemia, unspecified: Secondary | ICD-10-CM

## 2024-09-12 DIAGNOSIS — Z23 Encounter for immunization: Secondary | ICD-10-CM | POA: Diagnosis not present

## 2024-09-12 DIAGNOSIS — B372 Candidiasis of skin and nail: Secondary | ICD-10-CM

## 2024-09-12 DIAGNOSIS — K219 Gastro-esophageal reflux disease without esophagitis: Secondary | ICD-10-CM | POA: Diagnosis not present

## 2024-09-12 DIAGNOSIS — I10 Essential (primary) hypertension: Secondary | ICD-10-CM

## 2024-09-12 MED ORDER — NYSTATIN 100000 UNIT/GM EX POWD
1.0000 | Freq: Three times a day (TID) | CUTANEOUS | 2 refills | Status: AC
Start: 2024-09-12 — End: ?

## 2024-09-12 NOTE — Progress Notes (Signed)
 Established Patient Office Visit     CC/Reason for Visit: Follow-up chronic conditions, discuss acute concerns  HPI: Ann Long is a 73 y.o. female who is coming in today for the above mentioned reasons. Past Medical History is significant for: Hypertension, hyperlipidemia, GERD osteoporosis, history of recurrent DVT on chronic anticoagulation, heart failure with reduced ejection fraction followed by cardiology and B12 deficiency.  She is feeling well.  She would like to discuss 2 issues.  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  Currently not an issue but is requesting a refill of nystatin  powder.  She is having chronic bilateral lower extremity burning and tingling.   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 , right leg  to vena cava, then one in the left leg, then one in the left kidney --all were onsearate occasions    Dyspnea    Gallbladder disease    GERD (gastroesophageal reflux disease)    Heart murmur    Hepatitis    Hep A as child   Hypertension    Jaundice    as a child, her sister was diagnosed with it due to ingestion hepatitis, she later develped januce and thpugh was not testes herself , was presumed to also have the same time of hepatitis and was subsequesntly treated with globulin '   Left bundle branch block    my pcp told me years ago i had it , 2 -3 years ago my obgyn heard a heart murmur and sent me to [cardiology] where they did a ultrasound of my heart , they didnt find anything definitive , denies chest pain , sob, syncope    Osteoarthritis    Osteoporosis    Phlebitis    PONV (postoperative nausea and vomiting)     Past Surgical History:  Procedure Laterality Date   ABLATION  uterine   ?1995   CATARACT  EXTRACTION W/ INTRAOCULAR LENS IMPLANT Bilateral    CHOLECYSTECTOMY  over 10 years ago   REPLACEMENT TOTAL HIP W/  RESURFACING IMPLANTS     REVERSE SHOULDER ARTHROPLASTY Right 05/10/2023   Procedure: REVERSE SHOULDER ARTHROPLASTY;  Surgeon: Kay Kemps, MD;  Location: [facility];  Service: Orthopedics;  Laterality: Right;  interscalene block 110   TOTAL HIP ARTHROPLASTY Left 2009   at [place]    TOTAL HIP ARTHROPLASTY Right 09/20/2019   Procedure: TOTAL HIP ARTHROPLASTY ANTERIOR APPROACH;  Surgeon: Melodi Lerner, MD;  Location: [facility];  Service: Orthopedics;  Laterality: Right;    TUBAL LIGATION  1984    Social History:  reports that she has never smoked. She has never used smokeless tobacco. She reports that she does not drink alcohol and does not use drugs.  Allergies: Allergies  Allergen Reactions   Doxycycline Diarrhea   Sulfa Antibiotics Rash    Family History:  Family History  Problem Relation Age of Onset   Hypertension Mother    Stroke Mother 47   Osteoporosis Mother    Heart disease Mother        in chronic a fib   Heart attack Father 72       fatal   Stroke Father 87   Diabetes Father        ?  not diagnosed   Heart attack Sister    Diabetes Sister    Hypertension Sister    Kidney failure Sister    Atrial fibrillation Sister    Heart attack Sister 49   Heart disease Sister        enlarged heart   Hypertension Sister    Diabetes Sister    Heart attack Paternal Grandfather 68   Stroke Sister 78   Hypertension Sister    Atrial fibrillation Sister        multiple conversions   Diabetes Brother    Blindness Maternal Grandmother    Deafness Maternal Grandmother    Heart failure Maternal Grandmother 95   Hypertension Maternal Grandmother    Stomach cancer Maternal Grandfather    Hypertension Maternal Grandfather    Heart disease Paternal Uncle 75   Diabetes Paternal Uncle      Current Outpatient Medications:    acetaminophen  (TYLENOL ) 500 MG  tablet, Take 500 mg by mouth every 6 (six) hours as needed for moderate pain., Disp: , Rfl:    Ascorbic Acid (VITAMIN C) 1000 MG tablet, Take 1,000 mg by mouth daily., Disp: , Rfl:    carvedilol  (COREG ) 3.125 MG tablet, TAKE 1 TABLET BY MOUTH TWICE  DAILY, Disp: 180 tablet, Rfl: 3   Cholecalciferol  (VITAMIN D ) 50 MCG (2000 UT) CAPS, Take 2,000 Units by mouth daily., Disp: , Rfl:    Cyanocobalamin  (VITAMIN B 12 PO), Take 1,000 mcg by mouth every other day., Disp: , Rfl:    docusate sodium  (COLACE) 100 MG capsule, Take 100 mg by mouth daily., Disp: , Rfl:    ENTRESTO  24-26 MG, TAKE 1 TABLET BY MOUTH TWICE  DAILY, Disp: 180 tablet, Rfl: 3   Fexofenadine HCl (ALLEGRA PO), Take 180 mg by mouth daily in the afternoon., Disp: , Rfl:    fluticasone  (FLONASE ) 50 MCG/ACT nasal spray, Place 1 spray into both nostrils daily as needed for allergies or rhinitis., Disp: , Rfl:    folic acid  (FOLVITE ) 1 MG tablet, Take 1 mg by mouth 3 (three) times a week., Disp: , Rfl:    hydrochlorothiazide  (HYDRODIURIL ) 12.5 MG tablet, Take 1 tablet (12.5 mg total) by mouth daily., Disp: 90 tablet, Rfl: 3   hyoscyamine  (ANASPAZ ) 0.125 MG TBDP disintergrating tablet, DISSOLVE 1 TABLET ON THE TONGUE THREE TIMES DAILY BEFORE MEALS AS NEEDED, Disp: 270 tablet, Rfl: 1   nystatin  (MYCOSTATIN /NYSTOP ) powder, Apply 1 Application topically 3 (three) times daily., Disp: 60 g, Rfl: 2   pantoprazole  (PROTONIX ) 40 MG tablet, TAKE 1 TABLET BY MOUTH DAILY AS  NEEDED FOR ACID REFLUX, Disp: 90 tablet, Rfl: 1   Probiotic Product (PROBIOTIC DAILY PO), Take 1 capsule by mouth daily in the afternoon., Disp: , Rfl:    pyridOXINE (VITAMIN B6) 100 MG tablet, 100 mg 2 (two) times daily., Disp: , Rfl:    rosuvastatin  (CRESTOR ) 10 MG tablet, TAKE 1 TABLET BY MOUTH 4 TIMES  WEEKLY, Disp: 52 tablet, Rfl: 3   XARELTO  10 MG TABS tablet, TAKE 1 TABLET BY MOUTH DAILY, Disp: 90 tablet, Rfl: 3   amoxicillin  (AMOXIL ) 500 MG capsule, Take 2,000 mg by mouth See  admin instructions. Take 2000 mg by mouth prior to dental appointments (Patient not taking: Reported on 09/12/2024), Disp: , Rfl:   Review of Systems:  Negative unless indicated in HPI.   Physical Exam: Vitals:   09/12/24 0911  BP: 108/68  Pulse: 62  Resp: 18  Temp: (!) 97.5 F (36.4 C)  TempSrc:  Oral  SpO2: 99%  Weight: 230 lb 1.6 oz (104.4 kg)    Body mass index is 36.04 kg/m.   Physical Exam   Impression and Plan:  Immunization due -     Flu vaccine HIGH DOSE PF(Fluzone Trivalent)  History of DVT (deep vein thrombosis)  Chronic anticoagulation  Gastroesophageal reflux disease, unspecified whether esophagitis present  Hyperlipidemia, unspecified hyperlipidemia type  Hypertension, unspecified type  Yeast dermatitis -     Nystatin ; Apply 1 Application topically 3 (three) times daily.  Dispense: 60 g; Refill: 2  Chronic venous insufficiency   - Flu vaccine given in office today. - Blood pressure is well-controlled. - LDL is at goal at 49. - GERD is well-controlled on PPI therapy. - Nystatin  powder given for yeast dermatitis. - I suspect her feet burning and tingling is due to chronic venous insufficiency given findings on physical exam.  Have advised compression stockings, if not better can consider referral to vein and vascular.  Time spent:30 minutes reviewing chart, interviewing and examining patient and formulating plan of care.     Tully Theophilus Andrews, MD [facility]

## 2024-09-19 ENCOUNTER — Emergency Department (HOSPITAL_BASED_OUTPATIENT_CLINIC_OR_DEPARTMENT_OTHER): Admitting: Radiology

## 2024-09-19 ENCOUNTER — Emergency Department (HOSPITAL_BASED_OUTPATIENT_CLINIC_OR_DEPARTMENT_OTHER)
Admission: EM | Admit: 2024-09-19 | Discharge: 2024-09-19 | Disposition: A | Discharge status: Home / Self Care | Attending: Emergency Medicine | Admitting: Emergency Medicine

## 2024-09-19 ENCOUNTER — Encounter (HOSPITAL_BASED_OUTPATIENT_CLINIC_OR_DEPARTMENT_OTHER): Payer: Self-pay | Admitting: Emergency Medicine

## 2024-09-19 ENCOUNTER — Other Ambulatory Visit: Payer: Self-pay

## 2024-09-19 DIAGNOSIS — R110 Nausea: Secondary | ICD-10-CM | POA: Diagnosis not present

## 2024-09-19 DIAGNOSIS — R5383 Other fatigue: Secondary | ICD-10-CM | POA: Diagnosis not present

## 2024-09-19 DIAGNOSIS — M25511 Pain in right shoulder: Secondary | ICD-10-CM | POA: Diagnosis not present

## 2024-09-19 DIAGNOSIS — R002 Palpitations: Secondary | ICD-10-CM | POA: Diagnosis present

## 2024-09-19 DIAGNOSIS — Z7901 Long term (current) use of anticoagulants: Secondary | ICD-10-CM | POA: Diagnosis not present

## 2024-09-19 LAB — CBC WITH DIFFERENTIAL/PLATELET
Abs Immature Granulocytes: 0.03 K/uL (ref 0.00–0.07)
Basophils Absolute: 0 K/uL (ref 0.0–0.1)
Basophils Relative: 0 %
Eosinophils Absolute: 0 K/uL (ref 0.0–0.5)
Eosinophils Relative: 0 %
HCT: 41.3 % (ref 36.0–46.0)
Hemoglobin: 13.7 g/dL (ref 12.0–15.0)
Immature Granulocytes: 0 %
Lymphocytes Relative: 17 %
Lymphs Abs: 1.7 K/uL (ref 0.7–4.0)
MCH: 31.6 pg (ref 26.0–34.0)
MCHC: 33.2 g/dL (ref 30.0–36.0)
MCV: 95.2 fL (ref 80.0–100.0)
Monocytes Absolute: 0.5 K/uL (ref 0.1–1.0)
Monocytes Relative: 5 %
Neutro Abs: 8.1 K/uL — ABNORMAL HIGH (ref 1.7–7.7)
Neutrophils Relative %: 78 %
Platelets: 303 K/uL (ref 150–400)
RBC: 4.34 MIL/uL (ref 3.87–5.11)
RDW: 12.7 % (ref 11.5–15.5)
WBC: 10.3 K/uL (ref 4.0–10.5)
nRBC: 0 % (ref 0.0–0.2)

## 2024-09-19 LAB — COMPREHENSIVE METABOLIC PANEL WITH GFR
ALT: 12 U/L (ref 0–44)
AST: 19 U/L (ref 15–41)
Albumin: 4.2 g/dL (ref 3.5–5.0)
Alkaline Phosphatase: 56 U/L (ref 38–126)
Anion gap: 14 (ref 5–15)
BUN: 20 mg/dL (ref 8–23)
CO2: 21 mmol/L — ABNORMAL LOW (ref 22–32)
Calcium: 9.8 mg/dL (ref 8.9–10.3)
Chloride: 104 mmol/L (ref 98–111)
Creatinine, Ser: 0.93 mg/dL (ref 0.44–1.00)
GFR, Estimated: >60 mL/min (ref >60)
Glucose, Bld: 118 mg/dL — ABNORMAL HIGH (ref 70–99)
Potassium: 4.1 mmol/L (ref 3.5–5.1)
Sodium: 139 mmol/L (ref 135–145)
Total Bilirubin: 0.5 mg/dL (ref 0.0–1.2)
Total Protein: 7 g/dL (ref 6.5–8.1)

## 2024-09-19 LAB — MAGNESIUM: Magnesium: 2.2 mg/dL (ref 1.7–2.4)

## 2024-09-19 LAB — TROPONIN T, HIGH SENSITIVITY
Troponin T High Sensitivity: 17 ng/L (ref 0–19)
Troponin T High Sensitivity: 20 ng/L — ABNORMAL HIGH (ref 0–19)

## 2024-09-19 NOTE — Discharge Instructions (Addendum)
 Follow-up closely with your cardiologist in regards to your palpitations.  If you develop recurrent palpitations, chest pain, shortness of breath, or any other new/concerning symptoms then return to the ER.

## 2024-09-19 NOTE — ED Notes (Signed)
 Discharge instructions and follow up care reviewed and explained, pt verbalized understanding and had no further questions on d/c. Pt caox4, ambulatory NAD on d/c.

## 2024-09-19 NOTE — ED Provider Notes (Signed)
 [place] EMERGENCY DEPARTMENT AT [facility] Provider Note   CSN: [id number] Arrival date & time: 09/19/24  9158     Patient presents with: Nausea and Chest Pain   Ann Long is a 73 y.o. female.   HPI 73 year old female presents with nausea and palpitations/elevated heart rate.  Patient states around 3 in the morning she started having nausea and some dry heaving and trying to vomit but could not.  She checked her heart rate on her pulse oximeter and it was intermittently in the 130s.  Seem to go up and down.  She feels like she felt a couple palpitations but for the most part never noticed that her heart rate was high.  However she does remember feeling generally weak during this time.  She is also been having fatigue without any clear cause for the past several weeks.  Has chronic dyspnea that is unchanged.  No new dyspnea this morning.  No chest pain (the triage chief complaint says chest pain but she specifically denies this).  She had a little bit of pain to her posterior right shoulder but states that is been coming and going from time to time associated with her shoulder surgery in the past.  This did not seem out of the ordinary for her.  No midline back pain.  Right now she feels a lot better.  She is on Xarelto  for DVTs and has been compliant.  No leg swelling.  Prior to Admission medications   Medication Sig Start Date End Date Taking? Authorizing Provider  gabapentin (NEURONTIN) 100 MG capsule Take 200 mg by mouth 3 (three) times daily. 06/24/24  Yes [provider]  acetaminophen  (TYLENOL ) 500 MG tablet Take 500 mg by mouth every 6 (six) hours as needed for moderate pain.    [provider]  Ascorbic Acid (VITAMIN C) 1000 MG tablet Take 1,000 mg by mouth daily.    [provider]  carvedilol  (COREG ) 3.125 MG tablet TAKE 1 TABLET BY MOUTH TWICE  DAILY 09/12/24   Croitoru, Mihai, MD  Cholecalciferol  (VITAMIN D ) 50 MCG (2000 UT) CAPS Take  2,000 Units by mouth daily.    [provider]  Cyanocobalamin  (VITAMIN B 12 PO) Take 1,000 mcg by mouth every other day.    [provider]  docusate sodium  (COLACE) 100 MG capsule Take 100 mg by mouth daily.    [provider]  ENTRESTO  24-26 MG TAKE 1 TABLET BY MOUTH TWICE  DAILY 04/20/24   Croitoru, Mihai, MD  Fexofenadine HCl (ALLEGRA PO) Take 180 mg by mouth daily in the afternoon.    [provider]  fluticasone  (FLONASE ) 50 MCG/ACT nasal spray Place 1 spray into both nostrils daily as needed for allergies or rhinitis.    [provider]  folic acid  (FOLVITE ) 1 MG tablet Take 1 mg by mouth 3 (three) times a week.    [provider]  hydrochlorothiazide  (HYDRODIURIL ) 12.5 MG tablet Take 1 tablet (12.5 mg total) by mouth daily. 04/19/23   Croitoru, Mihai, MD  hyoscyamine  (ANASPAZ ) 0.125 MG TBDP disintergrating tablet DISSOLVE 1 TABLET ON THE TONGUE THREE TIMES DAILY BEFORE MEALS AS NEEDED 10/13/22   Theophilus Andrews, Tully GRADE, MD  nystatin  (MYCOSTATIN /NYSTOP ) powder Apply 1 Application topically 3 (three) times daily. 09/12/24   Theophilus Andrews, Tully GRADE, MD  pantoprazole  (PROTONIX ) 40 MG tablet TAKE 1 TABLET BY MOUTH DAILY AS  NEEDED FOR ACID REFLUX 04/19/24   Theophilus Andrews, Tully GRADE, MD  Probiotic Product (  PROBIOTIC DAILY PO) Take 1 capsule by mouth daily in the afternoon.    [provider]  pyridOXINE (VITAMIN B6) 100 MG tablet 100 mg 2 (two) times daily.    [provider]  rosuvastatin  (CRESTOR ) 10 MG tablet TAKE 1 TABLET BY MOUTH 4 TIMES  WEEKLY 01/10/24   Theophilus Andrews, Tully GRADE, MD  XARELTO  10 MG TABS tablet TAKE 1 TABLET BY MOUTH DAILY 03/23/24   Theophilus Andrews, Tully GRADE, MD    Allergies: Doxycycline and Sulfa antibiotics    Review of Systems  Constitutional:  Positive for fatigue. Negative for fever.  Respiratory:  Negative for shortness of breath.   Cardiovascular:  Positive for palpitations. Negative for  chest pain and leg swelling.  Gastrointestinal:  Positive for nausea. Negative for abdominal pain and diarrhea.    Updated Vital Signs BP 108/71 (BP Location: Left Arm)   Pulse 69   Temp 98.7 F (37.1 C) (Oral)   Resp 19   SpO2 98%   Physical Exam Vitals and nursing note reviewed.  Constitutional:      General: She is not in acute distress.    Appearance: She is well-developed. She is not ill-appearing or diaphoretic.  HENT:     Head: Normocephalic and atraumatic.  Cardiovascular:     Rate and Rhythm: Normal rate and regular rhythm.     Heart sounds: Normal heart sounds.  Pulmonary:     Effort: Pulmonary effort is normal.     Breath sounds: Normal breath sounds.  Abdominal:     Palpations: Abdomen is soft.     Tenderness: There is no abdominal tenderness.  Musculoskeletal:     Right lower leg: No edema.     Left lower leg: No edema.  Skin:    General: Skin is warm and dry.  Neurological:     Mental Status: She is alert.     (all labs ordered are listed, but only abnormal results are displayed) Labs Reviewed  COMPREHENSIVE METABOLIC PANEL WITH GFR - Abnormal; Notable for the following components:      Result Value   CO2 21 (*)    Glucose, Bld 118 (*)    All other components within normal limits  CBC WITH DIFFERENTIAL/PLATELET - Abnormal; Notable for the following components:   Neutro Abs 8.1 (*)    All other components within normal limits  TROPONIN T, HIGH SENSITIVITY - Abnormal; Notable for the following components:   Troponin T High Sensitivity 20 (*)    All other components within normal limits  MAGNESIUM  TROPONIN T, HIGH SENSITIVITY    EKG: EKG Interpretation Date/Time:  Tuesday September 19 2024 08:52:48 EDT Ventricular Rate:  69 PR Interval:  162 QRS Duration:  144 QT Interval:  424 QTC Calculation: 455 R Axis:   -20  Text Interpretation: Sinus rhythm Left bundle branch block no significant change since April 2025 Confirmed by Freddi Hamilton  [phone]) on 09/19/2024 8:53:50 AM  Radiology: DG Chest 2 View Result Date: 09/19/2024 CLINICAL DATA:  Chest pain. EXAM: CHEST - 2 VIEW COMPARISON:  09/04/2008. FINDINGS: The heart size and mediastinal contours are within normal limits. No focal consolidation, pleural effusion, or pneumothorax. No acute osseous abnormality. IMPRESSION: No acute cardiopulmonary findings. Electronically Signed   By: Harrietta Sherry M.D.   On: 09/19/2024 09:45     Procedures   Medications Ordered in the ED - No data to display  Medical Decision Making Amount and/or Complexity of Data Reviewed External Data Reviewed: notes. Labs: ordered.    Details: Troponins 17 and then 20 Radiology: ordered and independent interpretation performed.    Details: No CHF ECG/medicine tests: ordered and independent interpretation performed.    Details: Left bundle branch block   Patient presents with what sounds like nausea and a high heart rate on pulse oximeter.  No significant palpitations.  Never really had any chest pain.  I doubt this shoulder plain was atypical cardiac presentation or something else such as PE or dissection.  She has been compliant with her anticoagulants.  She is well-appearing.  No recurrent symptoms here.  Unclear if the nausea caused her heart rate to go high or if she had an arrhythmia causing some nausea.  We do not know what the rhythm was.  Cardiac monitoring here has been reviewed and there is no significant arrhythmia noted.  Her troponins are 17 and 20.  20 is technically abnormal but given that is 3 points within 17 I think this is not a clinically significant change.  I discussed with her that this is technically abnormal but not in the window that would be consistent with MI.  Offered we could do a third but she feels well enough to go home.  I think this is pretty reasonable, if she did have an arrhythmia, a troponin of 20 would be expected and not unreasonable.   Discussed she should follow-up with her cardiologist and they can consider Holter monitoring.  However I think ACS is pretty unlikely.  She is already anticoagulated if this was A-fib.  Otherwise, she feels well for discharge, will give return precautions.     Final diagnoses:  Palpitations    ED Discharge Orders          Ordered    Ambulatory referral to Cardiology       Comments: If you have not heard from the Cardiology office within the next 72 hours please call [phone].   09/19/24 1155               Freddi Hamilton, MD 09/19/24 [phone]

## 2024-09-19 NOTE — ED Triage Notes (Signed)
 Reports HR around 130s starting around 0300 this morning. Endorses some PC that rads into back between shoulder blades. Hx of CHF and multiple DVTs.

## 2024-09-20 ENCOUNTER — Ambulatory Visit: Attending: Emergency Medicine | Admitting: Emergency Medicine

## 2024-09-20 ENCOUNTER — Encounter: Payer: Self-pay | Admitting: Emergency Medicine

## 2024-09-20 ENCOUNTER — Ambulatory Visit (INDEPENDENT_AMBULATORY_CARE_PROVIDER_SITE_OTHER)

## 2024-09-20 VITALS — BP 96/64 | HR 72 | Ht 67.0 in | Wt 230.0 lb

## 2024-09-20 DIAGNOSIS — R072 Precordial pain: Secondary | ICD-10-CM

## 2024-09-20 DIAGNOSIS — I428 Other cardiomyopathies: Secondary | ICD-10-CM | POA: Diagnosis not present

## 2024-09-20 DIAGNOSIS — R002 Palpitations: Secondary | ICD-10-CM

## 2024-09-20 DIAGNOSIS — I25118 Atherosclerotic heart disease of native coronary artery with other forms of angina pectoris: Secondary | ICD-10-CM

## 2024-09-20 DIAGNOSIS — E785 Hyperlipidemia, unspecified: Secondary | ICD-10-CM

## 2024-09-20 NOTE — Progress Notes (Unsigned)
 Enrolled for [employer] to mail a ZIO XT long term holter monitor to the patients address on file.   Dr. Royann Shivers to read.

## 2024-09-20 NOTE — Progress Notes (Signed)
 Cardiology Office Note:    Date:  09/20/2024  ID:  Ann Long, DOB January 23, 1951, MRN [id number] PCP: Theophilus Andrews, Tully GRADE, MD  [facility] Providers Cardiologist:  Jerel Balding, MD       Patient Profile:       Chief Complaint: Acute visit post ED History of Present Illness:  Ann Long is a 73 y.o. 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  Documentation on ECGs going back to as far as 17 shows LBBB.  LVEF was estimated to be as low as 35-40%.  After initiation of medical therapy EF improved back to 50% on her echocardiogram in 03/2024.  She was on maximally tolerated doses of carvedilol  and Entresto  with doses limited by bradycardia and hypotension.  Attempting treatment with Farxiga  however was stopped due to worsening renal parameters.  She did undergo a coronary CTA on 11/19/2022 showing only mild single-vessel CAD, severe ostial right coronary artery stenosis with noncalcified plaque and FFR 0.77, calcium  score of only 34.  It was felt this was unlikely to explain her cardiomyopathy.  She does have a murmur due to aortic valve sclerosis but there is no evidence of aortic stenosis.  She is on chronic Xarelto  preventative dose of 10 mg daily as she has had several episodes of DVTs including 1 that did appear to be unprovoked.  She was last seen in clinic by Dr. JAYSON on 04/19/2024.  She remains asymptomatic without anginal symptoms.  She was NYHA functional class I and euvolemic on exam without loop diuretics.  Thought to be part of her reduction in her EF is due to left bundle branch block related dyssynchrony.  Overall no medication changes were made she was to follow-up in 1 year.  Most recently she was seen in the ED for palpitations and elevated heart rates just yesterday on 9/30.  Patient  reported around 3 in the morning she started having nausea dry heaving, and diarrhea.  She noted her pulse to be intermittently in the 130s which seem to go up and down.  She felt palpitations.  She also noted to have been experiencing fatigue for the past several weeks.  Her EKG was unremarkable for acute changes.  Her troponins were 17 and then 20.  Her symptoms has improved and she was to follow-up with her cardiologist   Discussed the use of AI scribe software for clinical note transcription with the patient, who gave verbal consent to proceed.  History of Present Illness Ann Long is a 73 year old female with coronary artery disease and heart failure who presents with fatigue and recent episodes of nausea and fluctuating heart rate.  She experiences significant fatigue for the past three weeks to a month, requiring frequent rest after minimal exertion. An episode of nausea occurred around 3 AM on Monday night, lasting until 6 or 7 AM, with dry heaving and a fluctuating heart rate reaching 138 bpm. She felt weak but did experience palpitations. The nausea resolved after a few hours with no recurrence.  She will occasionally measure her blood pressure at home.    She lives in [place] and has difficulty with physical activity due to bad knees, considering resuming water  aerobics.  She denies chest pains, orthopnea, PND, syncope, presyncope, lightheadedness, dizziness   Review of systems:  Please see the history of present illness.  All other systems are reviewed and otherwise negative.      Studies Reviewed:    EKG Interpretation Date/Time:  Wednesday September 20 2024 11:56:55 EDT Ventricular Rate:  62 PR Interval:  166 QRS Duration:  146 QT Interval:  446 QTC Calculation: 452 R Axis:   -28  Text Interpretation: Normal sinus rhythm Left bundle branch block When compared with ECG of 19-Sep-2024 08:52, PREVIOUS ECG IS PRESENT No significant change was found Confirmed by  Rana Dixon [phone]) on 09/20/2024 1:05:57 PM   Echocardiogram 04/10/2024 1. Septal motion consistent with conduction delay.. Left ventricular  ejection fraction, by estimation, is 50%. The left ventricle has low  normal function.   2. Right ventricular systolic function is low normal. The right  ventricular size is normal. There is normal pulmonary artery systolic  pressure.   3. Mild mitral valve regurgitation.   4. The aortic valve is tricuspid. Aortic valve regurgitation is not  visualized. Aortic valve sclerosis/calcification is present, without any  evidence of aortic stenosis.   5. The inferior vena cava is normal in size with greater than 50%  respiratory variability, suggesting right atrial pressure of 3 mmHg.   Echocardiogram 02/11/2023  1. Left ventricular ejection fraction, by estimation, is 40 to 45%. The  left ventricle has mildly decreased function. The left ventricle  demonstrates global hypokinesis. Left ventricular diastolic parameters are  consistent with Grade I diastolic  dysfunction (impaired relaxation).   2. Right ventricular systolic function is normal. The right ventricular  size is normal.   3. The mitral valve is normal in structure. Mild mitral valve  regurgitation. No evidence of mitral stenosis.   4. The aortic valve is tricuspid. Aortic valve regurgitation is not  visualized. Aortic valve sclerosis/calcification is present, without any  evidence of aortic stenosis.   5. The inferior vena cava is normal in size with greater than 50%  respiratory variability, suggesting right atrial pressure of 3 mmHg.   Coronary CTA 11/19/2022 1. Coronary artery calcium  score 34.3 Agatston units. This places the patient in the 56th percentile for age and gender, suggesting intermediate risk for future cardiac events.   2. Visually severe ostial RCA stenosis with noncalcified plaque. FFR 0.77 in the mid RCA suggests borderline hemodynamic significance of the  ostial RCA stenosis.  FFR 1. Ostial RCA stenosis of borderline hemodynamic significance.   Echocardiogram 10/21/2022  1. Mild global hypokinesis worse in the anteroseptum and inferoseptum.  Left ventricular ejection fraction, by estimation, is 35 to 40%. The left  ventricle has moderately decreased function. The left ventricle  demonstrates regional wall motion  abnormalities (see scoring diagram/findings for description). There is  mild concentric left ventricular hypertrophy. Left ventricular diastolic  parameters are consistent with Grade I diastolic dysfunction (impaired  relaxation). The average left  ventricular global longitudinal strain is -18.7 %. The global longitudinal  strain is normal.   2. Right ventricular systolic function is normal. The right ventricular  size is normal. There is normal pulmonary artery systolic pressure.   3. The mitral valve is normal in structure. Trivial mitral valve  regurgitation. No evidence of mitral stenosis.   4. The aortic valve is tricuspid. Aortic valve regurgitation is not  visualized. No aortic stenosis is present.   5. The inferior vena cava is normal in size with greater than 50%  respiratory variability, suggesting right atrial pressure of 3 mmHg.   Risk Assessment/Calculations:              Physical Exam:  VS:  BP 96/64 (BP Location: Left Arm, Patient Position: Sitting, Cuff Size: Normal)   Pulse 72   Ht 5' 7 (1.702 m)   Wt 230 lb (104.3 kg)   SpO2 98%   BMI 36.02 kg/m    Wt Readings from Last 3 Encounters:  09/20/24 230 lb (104.3 kg)  09/12/24 230 lb 1.6 oz (104.4 kg)  04/19/24 229 lb (103.9 kg)    GEN: Well nourished, well developed in no acute distress NECK: No JVD; No carotid bruits CARDIAC: RRR, no murmurs, rubs, gallops RESPIRATORY:  Clear to auscultation without rales, wheezing or rhonchi  ABDOMEN: Soft, non-tender, non-distended EXTREMITIES:  No edema; No acute deformity      Assessment and Plan:   Palpitations Nausea Fatigue Presented to ED on 9/30 with an episode of nausea lasting approximately 3 to 4 hours accompanied by diarrhea and palpitations with reported fluctuating heart rates up to 130 bpm.  She also reports ongoing fatigue over the past 3-4 weeks Her troponins were 17 and then 20 No chest pains, orthopnea, PND, syncope during the event but does report new mild dyspnea on exertion since she noticed increasing fatigue  - Today she reports no further nausea/diarrhea.  Furthermore denies chest pains and no further episodes of tachycardia or palpitations.  She does note continued profound fatigue which is bothering her the most - EKG today without new ischemic changes - Her BP today was borderline low normal at 96/64.  No hypotensive episodes in the ED.  I will have her begin to take her blood pressure daily and hold HCTZ if she is hypotensive.  She will send her blood pressures over MyChart in 1 week for review - Plan for Lexiscan Myoview for further evaluation as her nausea, DOE, and profound fatigue could possibly be anginal equivalent - Plan for 14-day ZIO monitor to rule out arrhythmia - She had reassuring CBC, BMET, MAG  Coronary artery disease Coronary calcium  score 34.3 (57% percentile) with severe ostial RCA stenosis with noncalcified plaque.  FFR 0.77 of the mid RCA suggest borderline hemodynamic significance - Today she denies chest pains.  As noted above planning for Lexiscan Myoview due to severe nausea mild DOE, and persistent fatigue as could be anginal equivalent - No ASA as she is on Xarelto  - Continue rosuvastatin  10 mg daily  CHF Nonischemic cardiomyopathy Echocardiogram 04/10/2024 with LVEF 50% Part of the reduction of her EF likely due to LBBB related dyssynchrony - Today she is clinically euvolemic and well compensated without loop diuretic therapy - No orthopnea, PND, LEE - Currently on maximum doses of GDMT - Her blood pressure is low normal today at  96/64.  Does not regularly monitor at home.  She will begin monitoring BP daily as noted above.  May have to consider discontinuing HCTZ if needed or further lowering her GDMT - No SGLT2i given history of worsening of kidney function - Continue carvedilol  3.125 mg twice daily, Entresto  24-26 mg twice daily  Left bundle branch block Patient believes this has been present for over 20 years, we documented at least back to 2017 - Future option for CRT-P if symptoms should worsen and EF drops to less than 35%  Hyperlipidemia LDL 49 on 02/2024 and well-controlled - Continue rosuvastatin  10 mg daily  Recurrent DVT She has had at least 4 episodes of DVT, of which only 1 was associated with an orthopedic procedure (total hip replacement 2021) and others were spontaneous - Plan for lifelong anticoagulation.  She reports previous  hypercoagulable workup by hematologist that was unrevealing - Continue Xarelto  10 mg daily    Informed Consent   Shared Decision Making/Informed Consent The risks [chest pain, shortness of breath, cardiac arrhythmias, dizziness, blood pressure fluctuations, myocardial infarction, stroke/transient ischemic attack, nausea, vomiting, allergic reaction, radiation exposure, metallic taste sensation and life-threatening complications (estimated to be 1 in 10,000)], benefits (risk stratification, diagnosing coronary artery disease, treatment guidance) and alternatives of a nuclear stress test were discussed in detail with Ms. Eastlick and she agrees to proceed.     Dispo:  Return in about 3 months (around 12/21/2024).  Signed, Ann LITTIE Louis, NP

## 2024-09-20 NOTE — Patient Instructions (Addendum)
 Medication Instructions:  NO CHANGES  Lab Work: NONE TO BE DONE TODAY.  Testing/Procedures: Your physician has requested that you have a lexiscan myoview. For further information please visit [email or web address] Please follow instruction sheet, as given.  ZIO XT- Long Term Monitor Instructions  Your physician has requested you wear a ZIO patch monitor for 14 days.  This is a single patch monitor. [employer] supplies one patch monitor per enrollment. Additional stickers are not available. Please do not apply patch if you will be having a Nuclear Stress Test,  Echocardiogram, Cardiac CT, MRI, or Chest Xray during the period you would be wearing the  monitor. The patch cannot be worn during these tests. You cannot remove and re-apply the  ZIO XT patch monitor.  Your ZIO patch monitor will be mailed 3 day USPS to your address on file. It may take 3-5 days  to receive your monitor after you have been enrolled.  Once you have received your monitor, please review the enclosed instructions. Your monitor  has already been registered assigning a specific monitor serial # to you.  Billing and Patient Assistance Program Information  We have supplied [employer] with any of your insurance information on file for billing purposes. [employer] offers a sliding scale Patient Assistance Program for patients that do not have  insurance, or whose insurance does not completely cover the cost of the ZIO monitor.  You must apply for the Patient Assistance Program to qualify for this discounted rate.  To apply, please call [employer] at [phone], select option 4, select option 2, ask to apply for  Patient Assistance Program. Meredeth will ask your household income, and how many people  are in your household. They will quote your out-of-pocket cost based on that information.  [employer] will also be able to set up a 14-month, interest-free payment plan if needed.  Applying the monitor   Shave hair from upper left  chest.  Hold abrader disc by orange tab. Rub abrader in 40 strokes over the upper left chest as  indicated in your monitor instructions.  Clean area with 4 enclosed alcohol pads. Let dry.  Apply patch as indicated in monitor instructions. Patch will be placed under collarbone on left  side of chest with arrow pointing upward.  Rub patch adhesive wings for 2 minutes. Remove white label marked 1. Remove the white  label marked 2. Rub patch adhesive wings for 2 additional minutes.  While looking in a mirror, press and release button in center of patch. A small green light will  flash 3-4 times. This will be your only indicator that the monitor has been turned on.  Do not shower for the first 24 hours. You may shower after the first 24 hours.  Press the button if you feel a symptom. You will hear a small click. Record Date, Time and  Symptom in the Patient Logbook.  When you are ready to remove the patch, follow instructions on the last 2 pages of Patient  Logbook. Stick patch monitor onto the last page of Patient Logbook.  Place Patient Logbook in the blue and white box. Use locking tab on box and tape box closed  securely. The blue and white box has prepaid postage on it. Please place it in the mailbox as  soon as possible. Your physician should have your test results approximately 7 days after the  monitor has been mailed back to [facility].  Call [facility] Customer Care at [phone] if you have questions regarding  your  ZIO XT patch monitor. Call them immediately if you see an orange light blinking on your  monitor.  If your monitor falls off in less than 4 days, contact our Monitor department at [phone].  If your monitor becomes loose or falls off after 4 days call [employer] at [phone] for  suggestions on securing your monitor   Follow-Up: At [facility], you and your health needs are our priority.  As part of our continuing mission to provide you  with exceptional heart care, our providers are all part of one team.  This team includes your primary Cardiologist (physician) and Advanced Practice Providers or APPs (Physician Assistants and Nurse Practitioners) who all work together to provide you with the care you need, when you need it.  Your next appointment:   3 MONTHS  Provider:   Jerel Balding, MD

## 2024-09-21 ENCOUNTER — Telehealth (HOSPITAL_COMMUNITY): Payer: Self-pay | Admitting: *Deleted

## 2024-09-21 NOTE — Telephone Encounter (Signed)
 Pt given instructions for MPI study.

## 2024-09-22 ENCOUNTER — Other Ambulatory Visit: Payer: Self-pay | Admitting: Emergency Medicine

## 2024-09-22 DIAGNOSIS — R002 Palpitations: Secondary | ICD-10-CM

## 2024-09-22 DIAGNOSIS — R072 Precordial pain: Secondary | ICD-10-CM

## 2024-09-22 DIAGNOSIS — I25118 Atherosclerotic heart disease of native coronary artery with other forms of angina pectoris: Secondary | ICD-10-CM

## 2024-09-22 DIAGNOSIS — E785 Hyperlipidemia, unspecified: Secondary | ICD-10-CM

## 2024-09-22 DIAGNOSIS — I428 Other cardiomyopathies: Secondary | ICD-10-CM

## 2024-09-28 ENCOUNTER — Ambulatory Visit (HOSPITAL_COMMUNITY)
Admission: RE | Admit: 2024-09-28 | Discharge: 2024-09-28 | Disposition: A | Discharge status: Home / Self Care | Source: Ambulatory Visit | Attending: Cardiology | Admitting: Cardiology

## 2024-09-28 DIAGNOSIS — E785 Hyperlipidemia, unspecified: Secondary | ICD-10-CM | POA: Diagnosis present

## 2024-09-28 DIAGNOSIS — R002 Palpitations: Secondary | ICD-10-CM | POA: Diagnosis present

## 2024-09-28 DIAGNOSIS — R072 Precordial pain: Secondary | ICD-10-CM | POA: Diagnosis not present

## 2024-09-28 DIAGNOSIS — I25118 Atherosclerotic heart disease of native coronary artery with other forms of angina pectoris: Secondary | ICD-10-CM | POA: Diagnosis not present

## 2024-09-28 DIAGNOSIS — I428 Other cardiomyopathies: Secondary | ICD-10-CM | POA: Diagnosis present

## 2024-09-28 MED ORDER — TECHNETIUM TC 99M TETROFOSMIN IV KIT
32.0000 | PACK | Freq: Once | INTRAVENOUS | Status: AC | PRN
  Administered 2024-09-28: 32 via INTRAVENOUS

## 2024-09-28 MED ORDER — TECHNETIUM TC 99M TETROFOSMIN IV KIT
10.2000 | PACK | Freq: Once | INTRAVENOUS | Status: AC | PRN
  Administered 2024-09-28: 10.2 via INTRAVENOUS

## 2024-09-28 MED ORDER — REGADENOSON 0.4 MG/5ML IV SOLN
INTRAVENOUS | Status: AC
  Filled 2024-09-28: qty 5

## 2024-09-28 MED ORDER — REGADENOSON 0.4 MG/5ML IV SOLN
0.4000 mg | Freq: Once | INTRAVENOUS | Status: AC
  Administered 2024-09-28: 0.4 mg via INTRAVENOUS

## 2024-09-29 ENCOUNTER — Ambulatory Visit: Payer: Self-pay | Admitting: Cardiovascular Disease

## 2024-09-29 LAB — MYOCARDIAL PERFUSION IMAGING
Base ST Depression (mm): 0 mm
LV dias vol: 105 mL (ref 46–106)
LV sys vol: 36 mL (ref 3.8–5.2)
Nuc Stress EF: 66 %
Peak HR: 80 {beats}/min
Rest HR: 56 {beats}/min
Rest Nuclear Isotope Dose: 10.2 mCi
SDS: 1
SRS: 2
SSS: 2
ST Depression (mm): 0 mm
Stress Nuclear Isotope Dose: 32 mCi
TID: 1.04

## 2024-10-03 ENCOUNTER — Ambulatory Visit: Payer: Self-pay | Admitting: Emergency Medicine

## 2024-10-04 ENCOUNTER — Ambulatory Visit: Admitting: Internal Medicine

## 2024-10-04 ENCOUNTER — Ambulatory Visit (INDEPENDENT_AMBULATORY_CARE_PROVIDER_SITE_OTHER)

## 2024-10-04 VITALS — BP 104/76 | HR 60 | Temp 98.5°F | Ht 67.0 in | Wt 229.3 lb

## 2024-10-04 VITALS — Ht 67.0 in | Wt 230.0 lb

## 2024-10-04 DIAGNOSIS — N289 Disorder of kidney and ureter, unspecified: Secondary | ICD-10-CM | POA: Diagnosis not present

## 2024-10-04 DIAGNOSIS — Z0000 Encounter for general adult medical examination without abnormal findings: Secondary | ICD-10-CM

## 2024-10-04 NOTE — Patient Instructions (Addendum)
 Ann Long,  Thank you for taking the time for your Medicare Wellness Visit. I appreciate your continued commitment to your health goals. Please review the care plan we discussed, and feel free to reach out if I can assist you further.  Medicare recommends these wellness visits once per year to help you and your care team stay ahead of potential health issues. These visits are designed to focus on prevention, allowing your provider to concentrate on managing your acute and chronic conditions during your regular appointments.  Please note that Annual Wellness Visits do not include a physical exam. Some assessments may be limited, especially if the visit was conducted virtually. If needed, we may recommend a separate in-person follow-up with your provider.  Ongoing Care Seeing your primary care provider every 3 to 6 months helps us  monitor your health and provide consistent, personalized care.   Referrals If a referral was made during today's visit and you haven't received any updates within two weeks, please contact the referred provider directly to check on the status.  Recommended Screenings:  Health Maintenance  Topic Date Due   COVID-19 Vaccine (6 - 2025-26 season) 08/21/2024   Breast Cancer Screening  03/02/2025   Medicare Annual Wellness Visit  10/04/2025   DTaP/Tdap/Td vaccine (2 - Tdap) 07/20/2028   Colon Cancer Screening  03/24/2030   Pneumococcal Vaccine for age over 13  Completed   Flu Shot  Completed   DEXA scan (bone density measurement)  Completed   Hepatitis C Screening  Completed   Zoster (Shingles) Vaccine  Completed   Meningitis B Vaccine  Aged Out       10/04/2024    8:51 AM  Advanced Directives  Does Patient Have a Medical Advance Directive? Yes  Type of Estate agent of [place];Living will  Copy of Healthcare Power of Attorney in Chart? No - copy requested  Would patient like information on creating a medical advance directive? No -  Patient declined   Advance Care Planning is important because it: Ensures you receive medical care that aligns with your values, goals, and preferences. Provides guidance to your family and loved ones, reducing the emotional burden of decision-making during critical moments.  Vision: Annual vision screenings are recommended for early detection of glaucoma, cataracts, and diabetic retinopathy. These exams can also reveal signs of chronic conditions such as diabetes and high blood pressure.  Dental: Annual dental screenings help detect early signs of oral cancer, gum disease, and other conditions linked to overall health, including heart disease and diabetes.  Please see the attached documents for additional preventive care recommendations.

## 2024-10-04 NOTE — Progress Notes (Signed)
 Established Patient Office Visit     CC/Reason for Visit: Follow-up left kidney lesion  HPI: Ann Long is a 73 y.o. female who is coming in today for the above mentioned reasons.  She had a cardiac stress test and has an incidental finding she was found to have a 1.7 cm hyperdense left upper pole renal lesion for which a renal protocol MRI was recommended by radiology.  She is here today to follow-up.   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 , right leg  to vena cava, then one in the left leg, then one in the left kidney --all were onsearate occasions    Dyspnea    Gallbladder disease    GERD (gastroesophageal reflux disease)    Heart murmur    Hepatitis    Hep A as child   Hypertension    Jaundice    as a child, her sister was diagnosed with it due to ingestion hepatitis, she later develped januce and thpugh was not testes herself , was presumed to also have the same time of hepatitis and was subsequesntly treated with globulin '   Left bundle branch block    my pcp told me years ago i had it , 2 -3 years ago my obgyn heard a heart murmur and sent me to [cardiology] where they did a ultrasound of my heart , they didnt find anything definitive , denies chest pain , sob, syncope    Osteoarthritis    Osteoporosis    Phlebitis    PONV (postoperative nausea and vomiting)     Past Surgical History:  Procedure Laterality Date   ABLATION  uterine   ?1995   CATARACT EXTRACTION W/ INTRAOCULAR LENS IMPLANT Bilateral    CHOLECYSTECTOMY  over 10 years ago   REPLACEMENT TOTAL HIP W/  RESURFACING IMPLANTS     REVERSE SHOULDER ARTHROPLASTY Right 05/10/2023   Procedure: REVERSE SHOULDER ARTHROPLASTY;  Surgeon: Kay Kemps, MD;  Location: [facility];  Service: Orthopedics;   Laterality: Right;  interscalene block 110   TOTAL HIP ARTHROPLASTY Left 2009   at [place]    TOTAL HIP ARTHROPLASTY Right 09/20/2019   Procedure: TOTAL HIP ARTHROPLASTY ANTERIOR APPROACH;  Surgeon: Melodi Lerner, MD;  Location: [facility];  Service: Orthopedics;  Laterality: Right;    TUBAL LIGATION  1984    Social History:  reports that she has never smoked. She has never used smokeless tobacco. She reports that she does not drink alcohol and does not use drugs.  Allergies: Allergies  Allergen Reactions   Doxycycline Diarrhea   Sulfa Antibiotics Rash    Family History:  Family History  Problem Relation Age of Onset   Hypertension Mother    Stroke Mother 50   Osteoporosis Mother    Heart disease Mother        in chronic a fib   Heart attack Father 28       fatal   Stroke Father 94   Diabetes Father        ?not diagnosed   Heart attack Sister    Diabetes Sister    Hypertension Sister    Kidney failure Sister    Atrial fibrillation Sister    Heart attack Sister 70  Heart disease Sister        enlarged heart   Hypertension Sister    Diabetes Sister    Heart attack Paternal Grandfather 59   Stroke Sister 2   Hypertension Sister    Atrial fibrillation Sister        multiple conversions   Diabetes Brother    Blindness Maternal Grandmother    Deafness Maternal Grandmother    Heart failure Maternal Grandmother 95   Hypertension Maternal Grandmother    Stomach cancer Maternal Grandfather    Hypertension Maternal Grandfather    Heart disease Paternal Uncle 59   Diabetes Paternal Uncle      Current Outpatient Medications:    acetaminophen  (TYLENOL ) 500 MG tablet, Take 500 mg by mouth every 6 (six) hours as needed for moderate pain., Disp: , Rfl:    Ascorbic Acid (VITAMIN C) 1000 MG tablet, Take 1,000 mg by mouth daily. (Patient taking differently: Take 1,000 mg by mouth 2 (two) times daily.), Disp: , Rfl:    carvedilol  (COREG ) 3.125 MG tablet, TAKE 1  TABLET BY MOUTH TWICE  DAILY, Disp: 180 tablet, Rfl: 1   Cholecalciferol  (VITAMIN D ) 50 MCG (2000 UT) CAPS, Take 2,000 Units by mouth daily., Disp: , Rfl:    Cyanocobalamin  (VITAMIN B 12 PO), Take 1,000 mcg by mouth every other day. (Patient taking differently: Take 1,000 mcg by mouth daily.), Disp: , Rfl:    docusate sodium  (COLACE) 100 MG capsule, Take 100 mg by mouth daily., Disp: , Rfl:    ENTRESTO  24-26 MG, TAKE 1 TABLET BY MOUTH TWICE  DAILY, Disp: 180 tablet, Rfl: 3   Fexofenadine HCl (ALLEGRA PO), Take 180 mg by mouth daily in the afternoon., Disp: , Rfl:    fluticasone  (FLONASE ) 50 MCG/ACT nasal spray, Place 1 spray into both nostrils daily as needed for allergies or rhinitis., Disp: , Rfl:    folic acid  (FOLVITE ) 1 MG tablet, Take 1 mg by mouth 3 (three) times a week., Disp: , Rfl:    gabapentin (NEURONTIN) 100 MG capsule, Take 200 mg by mouth 3 (three) times daily. (Patient taking differently: Take 200 mg by mouth daily as needed (FOR NERVE PAIN).), Disp: , Rfl:    hyoscyamine  (ANASPAZ ) 0.125 MG TBDP disintergrating tablet, DISSOLVE 1 TABLET ON THE TONGUE THREE TIMES DAILY BEFORE MEALS AS NEEDED, Disp: 270 tablet, Rfl: 1   nystatin  (MYCOSTATIN /NYSTOP ) powder, Apply 1 Application topically 3 (three) times daily., Disp: 60 g, Rfl: 2   pantoprazole  (PROTONIX ) 40 MG tablet, TAKE 1 TABLET BY MOUTH DAILY AS  NEEDED FOR ACID REFLUX, Disp: 90 tablet, Rfl: 1   Probiotic Product (PROBIOTIC DAILY PO), Take 1 capsule by mouth daily in the afternoon., Disp: , Rfl:    pyridOXINE (VITAMIN B6) 100 MG tablet, 100 mg 2 (two) times daily., Disp: , Rfl:    rosuvastatin  (CRESTOR ) 10 MG tablet, TAKE 1 TABLET BY MOUTH 4 TIMES  WEEKLY (Patient taking differently: Take 5 mg by mouth 4 (four) times a week.), Disp: 52 tablet, Rfl: 3   XARELTO  10 MG TABS tablet, TAKE 1 TABLET BY MOUTH DAILY, Disp: 90 tablet, Rfl: 3   hydrochlorothiazide  (HYDRODIURIL ) 12.5 MG tablet, Take 1 tablet (12.5 mg total) by mouth daily.,  Disp: 90 tablet, Rfl: 3  Review of Systems:  Negative unless indicated in HPI.   Physical Exam: Vitals:   10/04/24 1050  BP: 104/76  Pulse: 60  Temp: 98.5 F (36.9 C)  TempSrc: Oral  SpO2: 96%  Weight: 229 lb  4.8 oz (104 kg)  Height: 5' 7 (1.702 m)    Body mass index is 35.91 kg/m.    Impression and Plan:  Kidney lesion, native, left -     MR ABDOMEN W WO CONTRAST; Future   - Will order MRI abdomen with and without contrast per radiology recommendations to evaluate hyperdense left upper pole renal lesion.  Further recommendations to follow.  Time spent:22 minutes reviewing chart, interviewing and examining patient and formulating plan of care.     Tully Theophilus Andrews, MD [facility]

## 2024-10-04 NOTE — Progress Notes (Signed)
 Subjective:   Ann Long is a 73 y.o. who presents for a Medicare Wellness preventive visit.  As a reminder, Annual Wellness Visits don't include a physical exam, and some assessments may be limited, especially if this visit is performed virtually. We may recommend an in-person follow-up visit with your provider if needed.  Visit Complete: Virtual I connected with  [name] on 10/04/24 by a audio enabled telemedicine application and verified that I am speaking with the correct person using two identifiers.  Patient Location: Home  Provider Location: Home Office  I discussed the limitations of evaluation and management by telemedicine. The patient expressed understanding and agreed to proceed.  Vital Signs: Because this visit was a virtual/telehealth visit, some criteria may be missing or patient reported. Any vitals not documented were not able to be obtained and vitals that have been documented are patient reported.    Persons Participating in Visit: Patient.  AWV Questionnaire: No: Patient Medicare AWV questionnaire was not completed prior to this visit.  Cardiac Risk Factors include: advanced age (>38men, >5 women);hypertension     Objective:    Today's Vitals   10/04/24 0844  Weight: 230 lb (104.3 kg)  Height: 5' 7 (1.702 m)  PainSc: 0-No pain   Body mass index is 36.02 kg/m.     10/04/2024    8:51 AM 09/19/2024    8:47 AM 10/08/2023    9:28 AM 05/10/2023    2:23 PM 05/06/2023    1:01 PM 08/07/2021    9:04 AM 10/04/2019    9:17 PM  Advanced Directives  Does Patient Have a Medical Advance Directive? Yes No Yes Yes Yes No No  Type of Estate agent of [place];Living will  Healthcare Power of [place];Living will Healthcare Power of [employer] Power of Attorney    Does patient want to make changes to medical advance directive?    No - Patient declined     Copy of Healthcare Power of Attorney in Chart? No - copy requested  No  - copy requested No - copy requested No - copy requested    Would patient like information on creating a medical advance directive? No - Patient declined No - Patient declined    No - Patient declined     Current Medications (verified) Outpatient Encounter Medications as of 10/04/2024  Medication Sig   acetaminophen  (TYLENOL ) 500 MG tablet Take 500 mg by mouth every 6 (six) hours as needed for moderate pain.   Ascorbic Acid (VITAMIN C) 1000 MG tablet Take 1,000 mg by mouth daily. (Patient taking differently: Take 1,000 mg by mouth 2 (two) times daily.)   carvedilol  (COREG ) 3.125 MG tablet TAKE 1 TABLET BY MOUTH TWICE  DAILY   Cholecalciferol  (VITAMIN D ) 50 MCG (2000 UT) CAPS Take 2,000 Units by mouth daily.   Cyanocobalamin  (VITAMIN B 12 PO) Take 1,000 mcg by mouth every other day. (Patient taking differently: Take 1,000 mcg by mouth daily.)   docusate sodium  (COLACE) 100 MG capsule Take 100 mg by mouth daily.   ENTRESTO  24-26 MG TAKE 1 TABLET BY MOUTH TWICE  DAILY   Fexofenadine HCl (ALLEGRA PO) Take 180 mg by mouth daily in the afternoon.   fluticasone  (FLONASE ) 50 MCG/ACT nasal spray Place 1 spray into both nostrils daily as needed for allergies or rhinitis.   folic acid  (FOLVITE ) 1 MG tablet Take 1 mg by mouth 3 (three) times a week.   gabapentin (NEURONTIN) 100 MG capsule Take 200 mg  by mouth 3 (three) times daily. (Patient taking differently: Take 200 mg by mouth daily as needed (FOR NERVE PAIN).)   hydrochlorothiazide  (HYDRODIURIL ) 12.5 MG tablet Take 1 tablet (12.5 mg total) by mouth daily.   hyoscyamine  (ANASPAZ ) 0.125 MG TBDP disintergrating tablet DISSOLVE 1 TABLET ON THE TONGUE THREE TIMES DAILY BEFORE MEALS AS NEEDED   nystatin  (MYCOSTATIN /NYSTOP ) powder Apply 1 Application topically 3 (three) times daily.   pantoprazole  (PROTONIX ) 40 MG tablet TAKE 1 TABLET BY MOUTH DAILY AS  NEEDED FOR ACID REFLUX   Probiotic Product (PROBIOTIC DAILY PO) Take 1 capsule by mouth daily in the  afternoon.   pyridOXINE (VITAMIN B6) 100 MG tablet 100 mg 2 (two) times daily.   rosuvastatin  (CRESTOR ) 10 MG tablet TAKE 1 TABLET BY MOUTH 4 TIMES  WEEKLY (Patient taking differently: Take 5 mg by mouth 4 (four) times a week.)   XARELTO  10 MG TABS tablet TAKE 1 TABLET BY MOUTH DAILY   No facility-administered encounter medications on file as of 10/04/2024.    Allergies (verified) Doxycycline and Sulfa antibiotics   History: Past Medical History:  Diagnosis Date   Abnormal vaginal Pap smear    Arthritis    CHF (congestive heart failure) (HCC)    Coronary artery disease    Deep vein thrombosis (DVT) (HCC)    reports they were sporadic , right leg  to vena cava, then one in the left leg, then one in the left kidney --all were onsearate occasions    Dyspnea    Gallbladder disease    GERD (gastroesophageal reflux disease)    Heart murmur    Hepatitis    Hep A as child   Hypertension    Jaundice    as a child, her sister was diagnosed with it due to ingestion hepatitis, she later develped januce and thpugh was not testes herself , was presumed to also have the same time of hepatitis and was subsequesntly treated with globulin '   Left bundle branch block    my pcp told me years ago i had it , 2 -3 years ago my obgyn heard a heart murmur and sent me to [cardiology] where they did a ultrasound of my heart , they didnt find anything definitive , denies chest pain , sob, syncope    Osteoarthritis    Osteoporosis    Phlebitis    PONV (postoperative nausea and vomiting)    Past Surgical History:  Procedure Laterality Date   ABLATION  uterine   ?1995   CATARACT EXTRACTION W/ INTRAOCULAR LENS IMPLANT Bilateral    CHOLECYSTECTOMY  over 10 years ago   REPLACEMENT TOTAL HIP W/  RESURFACING IMPLANTS     REVERSE SHOULDER ARTHROPLASTY Right 05/10/2023   Procedure: REVERSE SHOULDER ARTHROPLASTY;  Surgeon: Kay Kemps, MD;  Location: [facility];  Service: Orthopedics;  Laterality: Right;   interscalene block 110   TOTAL HIP ARTHROPLASTY Left 2009   at [facility]    TOTAL HIP ARTHROPLASTY Right 09/20/2019   Procedure: TOTAL HIP ARTHROPLASTY ANTERIOR APPROACH;  Surgeon: Melodi Lerner, MD;  Location: [facility];  Service: Orthopedics;  Laterality: Right;    TUBAL LIGATION  1984   Family History  Problem Relation Age of Onset   Hypertension Mother    Stroke Mother 65   Osteoporosis Mother    Heart disease Mother        in chronic a fib   Heart attack Father 55       fatal   Stroke  Father 10   Diabetes Father        ?not diagnosed   Heart attack Sister    Diabetes Sister    Hypertension Sister    Kidney failure Sister    Atrial fibrillation Sister    Heart attack Sister 41   Heart disease Sister        enlarged heart   Hypertension Sister    Diabetes Sister    Heart attack Paternal Grandfather 9   Stroke Sister 86   Hypertension Sister    Atrial fibrillation Sister        multiple conversions   Diabetes Brother    Blindness Maternal Grandmother    Deafness Maternal Grandmother    Heart failure Maternal Grandmother 95   Hypertension Maternal Grandmother    Stomach cancer Maternal Grandfather    Hypertension Maternal Grandfather    Heart disease Paternal Uncle 51   Diabetes Paternal Uncle    Social History   Socioeconomic History   Marital status: Widowed    Spouse name: Not on file   Number of children: Not on file   Years of education: Not on file   Highest education level: Not on file  Occupational History   Not on file  Tobacco Use   Smoking status: Never   Smokeless tobacco: Never  Vaping Use   Vaping status: Never Used  Substance and Sexual Activity   Alcohol use: No   Drug use: No   Sexual activity: Not on file  Other Topics Concern   Not on file  Social History Narrative   Not on file   Social Drivers of Health   Financial Resource Strain: Low Risk  (10/04/2024)   Overall Financial Resource Strain (CARDIA)    Difficulty of  Paying Living Expenses: Not hard at all  Food Insecurity: No Food Insecurity (10/04/2024)   Hunger Vital Sign    Worried About Running Out of Food in the Last Year: Never true    Ran Out of Food in the Last Year: Never true  Transportation Needs: No Transportation Needs (10/04/2024)   PRAPARE - Administrator, Civil Service (Medical): No    Lack of Transportation (Non-Medical): No  Physical Activity: Inactive (10/04/2024)   Exercise Vital Sign    Days of Exercise per Week: 0 days    Minutes of Exercise per Session: 0 min  Stress: No Stress Concern Present (10/04/2024)   [employer] of Occupational Health - Occupational Stress Questionnaire    Feeling of Stress: Not at all  Social Connections: Moderately Integrated (10/04/2024)   Social Connection and Isolation Panel    Frequency of Communication with Friends and Family: More than three times a week    Frequency of Social Gatherings with Friends and Family: More than three times a week    Attends Religious Services: More than 4 times per year    Active Member of [employer] or Organizations: Yes    Attends Banker Meetings: More than 4 times per year    Marital Status: Widowed    Tobacco Counseling Counseling given: Not Answered    Clinical Intake:  Pre-visit preparation completed: Yes  Pain : No/denies pain Pain Score: 0-No pain     BMI - recorded: 36.02 Nutritional Status: BMI > 30  Obese Nutritional Risks: None Diabetes: No  Lab Results  Component Value Date   HGBA1C 5.6 02/22/2024     How often do you need to have someone help you when you read  instructions, pamphlets, or other written materials from your doctor or pharmacy?: 1 - Never  Interpreter Needed?: No  Information entered by :: Rojelio Blush LPN   Activities of Daily Living     10/04/2024    8:50 AM 10/08/2023    9:26 AM  In your present state of health, do you have any difficulty performing the following activities:   Hearing? 0 0  Vision? 0 0  Difficulty concentrating or making decisions? 0 0  Walking or climbing stairs? 0 0  Dressing or bathing? 0 0  Doing errands, shopping? 0 0  Preparing Food and eating ? N N  Using the Toilet? N N  In the past six months, have you accidently leaked urine? N N  Do you have problems with loss of bowel control? N N  Managing your Medications? N N  Managing your Finances? N N  Housekeeping or managing your Housekeeping? N N    Patient Care Team: Theophilus Andrews, Tully GRADE, MD as PCP - General (Internal Medicine) Francyne Headland, MD as PCP - Cardiology (Cardiology) Leva Rush, MD as Consulting Physician (Obstetrics and Gynecology) Saintclair Jasper, MD as Consulting Physician (Gastroenterology)  I have updated your Care Teams any recent Medical Services you may have received from other providers in the past year.     Assessment:   This is a routine wellness examination for Jessye.  Hearing/Vision screen Hearing Screening - Comments:: Denies hearing difficulties   Vision Screening - Comments:: Wears reading glasses - up to date with routine eye exams with  Dr Charmayne   Goals Addressed               This Visit's Progress     Increase physical activity (pt-stated)        Get more active.       Depression Screen     10/04/2024    8:49 AM 11/26/2023    3:43 PM 10/08/2023    9:25 AM 09/29/2022    9:24 AM 04/06/2022   10:30 AM 08/07/2021    9:05 AM 08/07/2021    9:02 AM  PHQ 2/9 Scores  PHQ - 2 Score 0 0 0 1 1 0 0  PHQ- 9 Score  2  2 3       Fall Risk     10/04/2024    8:50 AM 11/26/2023    2:30 PM 10/08/2023    9:27 AM 09/29/2022    9:24 AM 08/07/2021    9:05 AM  Fall Risk   Falls in the past year? 0 1 1 0 0  Number falls in past yr: 0 0 0 0 0  Injury with Fall? 0 1 1 0 0  Comment   Fx: Rt shoulder. Followed by medical attention    Risk for fall due to : No Fall Risks   No Fall Risks No Fall Risks  Follow up Falls evaluation completed Falls  evaluation completed Falls prevention discussed Falls evaluation completed  Falls evaluation completed      Data saved with a previous flowsheet row definition    MEDICARE RISK AT HOME:  Medicare Risk at Home Any stairs in or around the home?: No If so, are there any without handrails?: No Home free of loose throw rugs in walkways, pet beds, electrical cords, etc?: Yes Adequate lighting in your home to reduce risk of falls?: Yes Life alert?: No Use of a cane, walker or w/c?: No Grab bars in the bathroom?: No Shower chair or bench in  shower?: No Elevated toilet seat or a handicapped toilet?: Yes  TIMED UP AND GO:  Was the test performed?  No  Cognitive Function: 6CIT completed        10/04/2024    8:52 AM 10/08/2023    9:28 AM  6CIT Screen  What Year? 0 points 0 points  What month? 0 points 0 points  What time? 0 points 0 points  Count back from 20 0 points 0 points  Months in reverse 0 points 0 points  Repeat phrase 0 points 0 points  Total Score 0 points 0 points    Immunizations Immunization History  Administered Date(s) Administered   Fluad Quad(high Dose 65+) 09/24/2021, 09/29/2022   Fluad Trivalent(High Dose 65+) 08/25/2023   INFLUENZA, HIGH DOSE SEASONAL PF 09/13/2017, 09/07/2018, 09/01/2019, 10/08/2020, 09/12/2024   Moderna Sars-Covid-2 Vaccination 02/24/2021, 08/11/2021   PFIZER(Purple Top)SARS-COV-2 Vaccination 12/22/2019, 01/22/2020, 12/31/2023   Pneumococcal Conjugate-13 12/29/2016   Pneumococcal Polysaccharide-23 07/20/2018   RSV,unspecified 12/31/2023   Td 07/20/2018   Zoster Recombinant(Shingrix) 01/21/2022, 03/31/2022   Zoster, Live 08/30/2012    Screening Tests Health Maintenance  Topic Date Due   COVID-19 Vaccine (6 - 2025-26 season) 08/21/2024   Mammogram  03/02/2025   Medicare Annual Wellness (AWV)  10/04/2025   DTaP/Tdap/Td (2 - Tdap) 07/20/2028   Colonoscopy  03/24/2030   Pneumococcal Vaccine: 50+ Years  Completed   Influenza Vaccine   Completed   DEXA SCAN  Completed   Hepatitis C Screening  Completed   Zoster Vaccines- Shingrix  Completed   Meningococcal B Vaccine  Aged Out    Health Maintenance Items Addressed:   Additional Screening:  Vision Screening: Recommended annual ophthalmology exams for early detection of glaucoma and other disorders of the eye. Is the patient up to date with their annual eye exam?  Yes  Who is the provider or what is the name of the office in which the patient attends annual eye exams? Dr Charmayne  Dental Screening: Recommended annual dental exams for proper oral hygiene  Community Resource Referral / Chronic Care Management: CRR required this visit?  No   CCM required this visit?  No   Plan:    I have personally reviewed and noted the following in the patient's chart:   Medical and social history Use of alcohol, tobacco or illicit drugs  Current medications and supplements including opioid prescriptions. Patient is not currently taking opioid prescriptions. Functional ability and status Nutritional status Physical activity Advanced directives List of other physicians Hospitalizations, surgeries, and ER visits in previous 12 months Vitals Screenings to include cognitive, depression, and falls Referrals and appointments  In addition, I have reviewed and discussed with patient certain preventive protocols, quality metrics, and best practice recommendations. A written personalized care plan for preventive services as well as general preventive health recommendations were provided to patient.   Rojelio LELON Blush, LPN   [id number]/84/7974   After Visit Summary: (MyChart) Due to this being a telephonic visit, the after visit summary with patients personalized plan was offered to patient via MyChart   Notes: Nothing significant to report at this time.

## 2024-10-18 ENCOUNTER — Ambulatory Visit: Admitting: Physician Assistant

## 2024-10-20 ENCOUNTER — Ambulatory Visit (HOSPITAL_BASED_OUTPATIENT_CLINIC_OR_DEPARTMENT_OTHER)
Admission: RE | Admit: 2024-10-20 | Discharge: 2024-10-20 | Disposition: A | Discharge status: Home / Self Care | Source: Ambulatory Visit | Attending: Internal Medicine | Admitting: Internal Medicine

## 2024-10-20 ENCOUNTER — Other Ambulatory Visit: Payer: Self-pay | Admitting: *Deleted

## 2024-10-20 ENCOUNTER — Telehealth: Payer: Self-pay | Admitting: *Deleted

## 2024-10-20 DIAGNOSIS — I428 Other cardiomyopathies: Secondary | ICD-10-CM

## 2024-10-20 DIAGNOSIS — N289 Disorder of kidney and ureter, unspecified: Secondary | ICD-10-CM | POA: Insufficient documentation

## 2024-10-20 DIAGNOSIS — I25118 Atherosclerotic heart disease of native coronary artery with other forms of angina pectoris: Secondary | ICD-10-CM | POA: Diagnosis not present

## 2024-10-20 DIAGNOSIS — R072 Precordial pain: Secondary | ICD-10-CM | POA: Diagnosis not present

## 2024-10-20 DIAGNOSIS — E785 Hyperlipidemia, unspecified: Secondary | ICD-10-CM | POA: Diagnosis not present

## 2024-10-20 DIAGNOSIS — R002 Palpitations: Secondary | ICD-10-CM

## 2024-10-20 MED ORDER — GADOBUTROL 1 MMOL/ML IV SOLN
10.0000 mL | Freq: Once | INTRAVENOUS | Status: AC | PRN
  Administered 2024-10-20: 10 mL via INTRAVENOUS
  Filled 2024-10-20: qty 10

## 2024-10-20 MED ORDER — METOPROLOL SUCCINATE ER 25 MG PO TB24: 12.5000 mg | ORAL_TABLET | Freq: Every day | ORAL | 1 refills | Status: DC

## 2024-10-20 NOTE — Telephone Encounter (Signed)
 Spoke to patient and let her know that per [facility] she is to stop taking the Carvedilol  and start taking Metoprolol  Succinate 12.5 MG (1/2 tablet) at bedtime. A new prescription for the metoprolol  Succinate has been sent to her pharmacy and she understood instructions.

## 2024-10-23 ENCOUNTER — Ambulatory Visit: Payer: Self-pay | Admitting: Internal Medicine

## 2024-12-05 ENCOUNTER — Telehealth: Payer: Self-pay

## 2024-12-05 NOTE — Telephone Encounter (Signed)
 Copied from CRM #[id number]. Topic: Clinical - Medical Advice >> Dec 05, 2024 10:42 AM Nessti S wrote: Reason for CRM: pt called in because grandson has influenza A with high fevers but pt does not have symptoms but would like to be prescribed a preventative to be on the safe side. She would like a cb if possible  [phone]

## 2024-12-05 NOTE — Telephone Encounter (Signed)
 Patient informed of Dr. Dennard message. Patient voiced understanding

## 2024-12-06 ENCOUNTER — Ambulatory Visit: Payer: Self-pay

## 2024-12-06 ENCOUNTER — Other Ambulatory Visit: Payer: Self-pay | Admitting: Internal Medicine

## 2024-12-06 DIAGNOSIS — J111 Influenza due to unidentified influenza virus with other respiratory manifestations: Secondary | ICD-10-CM

## 2024-12-06 MED ORDER — OSELTAMIVIR PHOSPHATE 75 MG PO CAPS: 75.0000 mg | ORAL_CAPSULE | Freq: Two times a day (BID) | ORAL | 0 refills | Status: AC

## 2024-12-06 NOTE — Telephone Encounter (Signed)
 FYI Only or Action Required?: Action required by provider: Requesting Tamiflu . Patient advised to take OTC flu test and call back with results.   Patient was last seen in primary care on 10/04/2024 by Ann Long, Tully GRADE, MD.  Called Nurse Triage reporting Influenza.  Symptoms began today.  Interventions attempted: Nothing.  Symptoms are: stable.  Triage Disposition: See Physician Within 24 Hours  Patient/caregiver understands and will follow disposition?: Yes  Reason for Disposition  [1] Known COPD or other severe lung disease (i.e., bronchiectasis, cystic fibrosis, lung surgery) AND [2] symptoms getting worse (i.e., increased sputum purulence or amount, increased breathing difficulty  Answer Assessment - Initial Assessment Questions Pt called in to report chills and body aches, temp 99.8 and 100, coughing, some congestion. Pt states she is feeling yucky and was told by clinic to return call if she developed symptoms d/t exposure at Thanksgiving. Pt states she will start Mucinex  DM, has not taken anything otherwise. Drinking fluids to stay hydrated.  1. ONSET: When did the cough begin?      12/06/24 2. SEVERITY: How bad is the cough today?      Not sever 3. SPUTUM: Describe the color of your sputum (e.g., none, dry cough; clear, white, yellow, green)     Deos not come all the way up 4. HEMOPTYSIS: Are you coughing up any blood? If Yes, ask: How much? (e.g., flecks, streaks, tablespoons, etc.)     no 5. DIFFICULTY BREATHING: Are you having difficulty breathing? If Yes, ask: How bad is it? (e.g., mild, moderate, severe)      no 6. FEVER: Do you have a fever? If Yes, ask: What is your temperature, how was it measured, and when did it start?     99.8 and 100 7. CARDIAC HISTORY: Do you have any history of heart disease? (e.g., heart attack, congestive heart failure)      HTN 8. LUNG HISTORY: Do you have any history of lung disease?  (e.g., pulmonary  embolus, asthma, emphysema)     CHF 9. PE RISK FACTORS: Do you have a history of blood clots? (or: recent major surgery, recent prolonged travel, bedridden)     Yes, xarelto  10. OTHER SYMPTOMS: Do you have any other symptoms? (e.g., runny nose, wheezing, chest pain)       Denies  Protocols used: Cough - Acute Productive-A-AH

## 2024-12-06 NOTE — Telephone Encounter (Signed)
 Patient informed.

## 2024-12-07 ENCOUNTER — Telehealth: Payer: Self-pay

## 2024-12-07 NOTE — Telephone Encounter (Signed)
 Copied from CRM #[id number]. Topic: Clinical - Medical Advice >> Dec 07, 2024  9:44 AM Suzen RAMAN wrote: Reason for CRM: Patient would like to know if she should still take oseltamivir  (TAMIFLU ) 75 MG capsule since she test (-) for the flu?

## 2024-12-29 ENCOUNTER — Encounter: Payer: Self-pay | Admitting: Cardiovascular Disease

## 2024-12-29 ENCOUNTER — Ambulatory Visit: Attending: Cardiovascular Disease | Admitting: Cardiovascular Disease

## 2024-12-29 VITALS — BP 104/60 | Ht 67.0 in | Wt 234.4 lb

## 2024-12-29 DIAGNOSIS — E7800 Pure hypercholesterolemia, unspecified: Secondary | ICD-10-CM

## 2024-12-29 DIAGNOSIS — I447 Left bundle-branch block, unspecified: Secondary | ICD-10-CM | POA: Diagnosis not present

## 2024-12-29 DIAGNOSIS — I2510 Atherosclerotic heart disease of native coronary artery without angina pectoris: Secondary | ICD-10-CM

## 2024-12-29 DIAGNOSIS — Z86718 Personal history of other venous thrombosis and embolism: Secondary | ICD-10-CM

## 2024-12-29 DIAGNOSIS — I5042 Chronic combined systolic (congestive) and diastolic (congestive) heart failure: Secondary | ICD-10-CM

## 2024-12-29 MED ORDER — METOPROLOL SUCCINATE ER 25 MG PO TB24: 12.5000 mg | ORAL_TABLET | Freq: Every day | ORAL | 3 refills | Status: AC

## 2024-12-29 MED ORDER — SACUBITRIL-VALSARTAN 24-26 MG PO TABS: 1.0000 | ORAL_TABLET | Freq: Two times a day (BID) | ORAL | 3 refills | Status: AC

## 2024-12-29 NOTE — Progress Notes (Unsigned)
 " Cardiology Office Note:    Date:  12/29/2024   ID:  Ann Long, DOB 03-Sep-1951, MRN [id number]  PCP:  Theophilus Andrews, Tully GRADE, MD    [facility] Providers Cardiologist:  Jerel Balding, MD     Referring MD: Theophilus Andrews, Tully GRADE, MD   No chief complaint on file.   History of Present Illness:    Ann Long is a 74 y.o. 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  The patient specifically denies any chest pain at rest or with exertion, dyspnea at rest or with exertion, orthopnea, paroxysmal nocturnal dyspnea, syncope, palpitations, focal neurological deficits, intermittent claudication, lower extremity edema, unexplained weight gain, cough, hemoptysis or wheezing.  She believes she has had an LBBB for at least 20 years.  I can find documentation on ECGs going back as far as 2017.  LVEF was estimated to be as low as 35-40% on previous echo, although this was probably an underestimation of true EF.  After initiation of medical therapy EF has improved back to 50% according to most recent echo performed just a week before this appointment.  She is on maximum tolerated doses of carvedilol  and Entresto  with doses limited by bradycardia and low blood pressure.  She is not on spironolactone due to low blood pressure.  Attempted treatment with Farxiga  was stopped due to worsening renal parameters.  Coronary CT angiogram performed 11/19/2022 showed only mild single-vessel CAD, severe ostial right coronary artery stenosis with noncalcified plaque and FFR 0.77, calcium  score of only 34 (56%ile).  It was felt that this was unlikely to explain her cardiomyopathy.  She has a murmur due to aortic valve sclerosis, but there is no evidence of aortic stenosis.  On statin therapy she has an excellent LDL cholesterol of 49 and an improved HDL at 46.6.  She has normal triglycerides  and does not have diabetes.  She is not a smoker.  She is on chronic Xarelto  preventive dose of 10 mg daily since she has had several episodes of DVT, including 1 that appear to be unprovoked.  She has not had any falls, injuries or bleeding problems.  Her dad died of a massive heart attack at age 26 and her paternal uncle died at age 31 and there is a strong history of diabetes mellitus on that side of the family.  She has a brother with diabetes mellitus who has had a stent in his 47s.  She has 2 sisters with atrial fibrillation.  Past Medical History:  Diagnosis Date   Abnormal vaginal Pap smear    Arthritis    CHF (congestive heart failure) (HCC)    Coronary artery disease    Deep vein thrombosis (DVT) (HCC)    reports they were sporadic , right leg  to vena cava, then one in the left leg, then one in the left kidney --all were onsearate occasions    Dyspnea    Gallbladder disease    GERD (gastroesophageal reflux disease)    Heart murmur    Hepatitis    Hep A as child   Hypertension    Jaundice    as a child, her sister was diagnosed with it due to ingestion hepatitis, she later develped januce and thpugh was not testes herself , was presumed to also have the same time of hepatitis and was subsequesntly treated with globulin '   Left bundle branch block  my pcp told me years ago i had it , 2 -3 years ago my obgyn heard a heart murmur and sent me to [cardiology] where they did a ultrasound of my heart , they didnt find anything definitive , denies chest pain , sob, syncope    Osteoarthritis    Osteoporosis    Phlebitis    PONV (postoperative nausea and vomiting)     Past Surgical History:  Procedure Laterality Date   ABLATION  uterine   ?1995   CATARACT EXTRACTION W/ INTRAOCULAR LENS IMPLANT Bilateral    CHOLECYSTECTOMY  over 10 years ago   REPLACEMENT TOTAL HIP W/  RESURFACING IMPLANTS     REVERSE SHOULDER ARTHROPLASTY Right 05/10/2023   Procedure: REVERSE SHOULDER  ARTHROPLASTY;  Surgeon: Kay Kemps, MD;  Location: [facility];  Service: Orthopedics;  Laterality: Right;  interscalene block 110   TOTAL HIP ARTHROPLASTY Left 2009   at [facility]    TOTAL HIP ARTHROPLASTY Right 09/20/2019   Procedure: TOTAL HIP ARTHROPLASTY ANTERIOR APPROACH;  Surgeon: Melodi Lerner, MD;  Location: [facility];  Service: Orthopedics;  Laterality: Right;    TUBAL LIGATION  1984    Current Medications: Current Meds  Medication Sig   acetaminophen  (TYLENOL ) 500 MG tablet Take 500 mg by mouth every 6 (six) hours as needed for moderate pain.   Ascorbic Acid (VITAMIN C) 1000 MG tablet Take 1,000 mg by mouth daily.   Cholecalciferol  (VITAMIN D ) 50 MCG (2000 UT) CAPS Take 2,000 Units by mouth daily.   Cyanocobalamin  (VITAMIN B 12 PO) Take 1,000 mcg by mouth every other day. (Patient taking differently: Take 1,000 mcg by mouth daily.)   docusate sodium  (COLACE) 100 MG capsule Take 100 mg by mouth daily.   ENTRESTO  24-26 MG TAKE 1 TABLET BY MOUTH TWICE  DAILY   Fexofenadine HCl (ALLEGRA PO) Take 180 mg by mouth daily in the afternoon.   fluticasone  (FLONASE ) 50 MCG/ACT nasal spray Place 1 spray into both nostrils daily as needed for allergies or rhinitis.   folic acid  (FOLVITE ) 1 MG tablet Take 1 mg by mouth 3 (three) times a week.   gabapentin (NEURONTIN) 100 MG capsule Take 200 mg by mouth 3 (three) times daily. (Patient taking differently: Take 200 mg by mouth daily as needed (FOR NERVE PAIN).)   hydrochlorothiazide  (HYDRODIURIL ) 12.5 MG tablet Take 1 tablet (12.5 mg total) by mouth daily.   hyoscyamine  (ANASPAZ ) 0.125 MG TBDP disintergrating tablet DISSOLVE 1 TABLET ON THE TONGUE THREE TIMES DAILY BEFORE MEALS AS NEEDED   metoprolol  succinate (TOPROL  XL) 25 MG 24 hr tablet Take 0.5 tablets (12.5 mg total) by mouth at bedtime.   nystatin  (MYCOSTATIN /NYSTOP ) powder Apply 1 Application topically 3 (three) times daily.   pantoprazole  (PROTONIX ) 40 MG tablet TAKE 1 TABLET BY MOUTH  DAILY AS  NEEDED FOR ACID REFLUX   Probiotic Product (PROBIOTIC DAILY PO) Take 1 capsule by mouth daily in the afternoon.   pyridOXINE (VITAMIN B6) 100 MG tablet 100 mg 2 (two) times daily.   XARELTO  10 MG TABS tablet TAKE 1 TABLET BY MOUTH DAILY     Allergies:   Doxycycline and Sulfa antibiotics  Family History: The patient's family history includes Atrial fibrillation in her sister and sister; Blindness in her maternal grandmother; Deafness in her maternal grandmother; Diabetes in her brother, father, paternal uncle, sister, and sister; Heart attack in her sister; Heart attack (age of onset: 70) in her paternal grandfather; Heart attack (age of onset: 51) in her father;  Heart attack (age of onset: 3) in her sister; Heart disease in her mother and sister; Heart disease (age of onset: 51) in her paternal uncle; Heart failure (age of onset: 56) in her maternal grandmother; Hypertension in her maternal grandfather, maternal grandmother, mother, sister, sister, and sister; Kidney failure in her sister; Osteoporosis in her mother; Stomach cancer in her maternal grandfather; Stroke (age of onset: 35) in her father; Stroke (age of onset: 40) in her sister; Stroke (age of onset: 53) in her mother.   EKGs/Labs/Other Studies Reviewed:    The following studies were reviewed today:   Coronary CTA11/30/2023: 1. Coronary artery calcium  score 34.3 Agatston units. This places the patient in the 56th percentile for age and gender, suggesting intermediate risk for future cardiac events.   2. Visually severe ostial RCA stenosis with noncalcified plaque. FFR 0.77 in the mid RCA suggests borderline hemodynamic significance of the ostial RCA stenosis.  04/10/2024 ECHO   1. Septal motion consistent with conduction delay.. Left ventricular  ejection fraction, by estimation, is 50%. The left ventricle has low  normal function.   2. Right ventricular systolic function is low normal. The right  ventricular size  is normal. There is normal pulmonary artery systolic  pressure.   3. Mild mitral valve regurgitation.   4. The aortic valve is tricuspid. Aortic valve regurgitation is not  visualized. Aortic valve sclerosis/calcification is present, without any  evidence of aortic stenosis.   5. The inferior vena cava is normal in size with greater than 50%  respiratory variability, suggesting right atrial pressure of 3 mmHg.    EKG Interpretation Date/Time:    Ventricular Rate:    PR Interval:    QRS Duration:    QT Interval:    QTC Calculation:   R Axis:      Text Interpretation:          Recent Labs: 02/22/2024: TSH 2.75 09/19/2024: ALT 12; BUN 20; Creatinine, Ser 0.93; Hemoglobin 13.7; Magnesium 2.2; Platelets 303; Potassium 4.1; Sodium 139  Recent Lipid Panel    Component Value Date/Time   CHOL 115 02/22/2024 0935   CHOL 136 04/19/2023 0922   TRIG 95.0 02/22/2024 0935   HDL 46.60 02/22/2024 0935   HDL 42 04/19/2023 0922   CHOLHDL 2 02/22/2024 0935   VLDL 19.0 02/22/2024 0935   LDLCALC 49 02/22/2024 0935   LDLCALC 66 04/19/2023 0922   LDLDIRECT 110.0 03/24/2021 1101     Risk Assessment/Calculations:          Physical Exam:    VS:  There were no vitals taken for this visit.    Wt Readings from Last 3 Encounters:  10/04/24 229 lb 4.8 oz (104 kg)  10/04/24 230 lb (104.3 kg)  09/20/24 230 lb (104.3 kg)      General: Alert, oriented x3, no distress, moderately obese Head: no evidence of trauma, PERRL, EOMI, no exophtalmos or lid lag, no myxedema, no xanthelasma; normal ears, nose and oropharynx Neck: normal jugular venous pulsations and no hepatojugular reflux; brisk carotid pulses without delay and no carotid bruits Chest: clear to auscultation, no signs of consolidation by percussion or palpation, normal fremitus, symmetrical and full respiratory excursions Cardiovascular: normal position and quality of the apical impulse, regular rhythm, normal first and paradoxically  split second heart sounds, 2/6 early peaking aortic ejection murmur, no diastolic murmurs, rubs or gallops Abdomen: no tenderness or distention, no masses by palpation, no abnormal pulsatility or arterial bruits, normal bowel sounds, no hepatosplenomegaly Extremities: no  clubbing, cyanosis or edema; 2+ radial, ulnar and brachial pulses bilaterally; 2+ right femoral, posterior tibial and dorsalis pedis pulses; 2+ left femoral, posterior tibial and dorsalis pedis pulses; no subclavian or femoral bruits Neurological: grossly nonfocal Psych: Normal mood and affect    ASSESSMENT:    No diagnosis found.   PLAN:    In order of problems listed above:  CAD: Remains asymptomatic.  Single vessel borderline ostial RCA stenosis, without angina pectoris.  At this point do not recommend revascularization, but this is an option if she should develop angina.  On chronic Xarelto  anticoagulation, not on aspirin. CHF: NYHA functional class I, clinically euvolemic without loop diuretics.  On maximum doses of guideline directed medical therapy (not on spironolactone due to low blood pressure, did not tolerate Farxiga  due to worsening renal function).  She has primarily nonischemic cardiomyopathy.   Part of the reduction in EF is due to LBBB related dyssynchrony.   LBBB: She believes this has been present for at least 20 years, we can documented at least back to 2017.  Longstanding conduction abnormality.  Typical LBBB with QRS greater than 150 ms.  Future option for CRT-P if her symptoms should worsen and the EF drops to less than 35%. Recurrent DVT: No problems on Xarelto .  There appears to be a family history of clotting disorders.  She states that she had a hematologist evaluate her about 20 years ago and there were no identifiable causes for hypercoagulability.  Has had at least 4 independent episodes of DVT of which only 1 was associated with an orthopedic procedure (total hip replacement 2021), others were  spontaneous.  Plan lifelong anticoagulation.  She reports a previous hypercoagulable work-up by hematologist was unrevealing. HLP: All lipid parameters are excellent.  Continue current statin. Obesity: Continues to struggle with attempts at weight loss.  Weight loss would be beneficial for many points of view and would also likely improve her symptoms of shortness of breath.  Recurrent DVT.  Borderline hypertriglyceridemia borderline low HDL.  She does not have daytime hypersomnolence.  She does not have diabetes mellitus.             Medication Adjustments/Labs and Tests Ordered: Current medicines are reviewed at length with the patient today.  Concerns regarding medicines are outlined above.  No orders of the defined types were placed in this encounter.  No orders of the defined types were placed in this encounter.   There are no Patient Instructions on file for this visit.   Signed, Jerel Balding, MD  12/29/2024 9:15 AM    [facility] "

## 2024-12-29 NOTE — Patient Instructions (Signed)
Medication Instructions:  No changes  *If you need a refill on your cardiac medications before your next appointment, please call your pharmacy*   Lab Work: Not needed If you have labs (blood work) drawn today and your tests are completely normal, you will receive your results only by: MyChart Message (if you have MyChart) OR A paper copy in the mail If you have any lab test that is abnormal or we need to change your treatment, we will call you to review the results.   Testing/Procedures: Not needed   Follow-Up: At [facility], you and your health needs are our priority.  As part of our continuing mission to provide you with exceptional heart care, we have created designated Provider Care Teams.  These Care Teams include your primary Cardiologist (physician) and Advanced Practice Providers (APPs -  Physician Assistants and Nurse Practitioners) who all work together to provide you with the care you need, when you need it.     Your next appointment:   12 month(s)  The format for your next appointment:   In Person  Provider:   Thurmon Fair, MD

## 2024-12-30 ENCOUNTER — Encounter: Payer: Self-pay | Admitting: Cardiovascular Disease

## 2024-12-30 DIAGNOSIS — I5042 Chronic combined systolic (congestive) and diastolic (congestive) heart failure: Secondary | ICD-10-CM | POA: Insufficient documentation

## 2024-12-30 DIAGNOSIS — I2510 Atherosclerotic heart disease of native coronary artery without angina pectoris: Secondary | ICD-10-CM | POA: Insufficient documentation

## 2025-10-10 ENCOUNTER — Ambulatory Visit
